# Patient Record
Sex: Female | Born: 1950 | Race: White | Hispanic: No | Smoking: Never smoker
Health system: Southern US, Community
[De-identification: ages and names within clinical notes are randomized; demographics above are authoritative.]

## PROBLEM LIST (undated history)

## (undated) DIAGNOSIS — R000 Tachycardia, unspecified: Secondary | ICD-10-CM

## (undated) DIAGNOSIS — I472 Ventricular tachycardia: Secondary | ICD-10-CM

## (undated) DIAGNOSIS — M329 Systemic lupus erythematosus, unspecified: Secondary | ICD-10-CM

## (undated) DIAGNOSIS — N189 Chronic kidney disease, unspecified: Secondary | ICD-10-CM

## (undated) HISTORY — DX: Tachycardia, unspecified: R00.0

## (undated) HISTORY — PX: COLON SURGERY: SHX602

## (undated) HISTORY — DX: Systemic lupus erythematosus, unspecified: M32.9

## (undated) HISTORY — PX: GALLBLADDER SURGERY: SHX652

## (undated) HISTORY — PX: SPLENECTOMY, TOTAL: SHX788

## (undated) HISTORY — PX: OTHER SURGICAL HISTORY: SHX169

## (undated) HISTORY — PX: ABDOMINAL HYSTERECTOMY: SHX81

## (undated) HISTORY — DX: Ventricular tachycardia: I47.2

---

## 1999-02-10 ENCOUNTER — Inpatient Hospital Stay (HOSPITAL_COMMUNITY): Admission: AD | Admit: 1999-02-10 | Discharge: 1999-02-12 | Payer: Self-pay | Admitting: Cardiology

## 1999-03-04 ENCOUNTER — Encounter: Payer: Self-pay | Admitting: Internal Medicine

## 2001-08-01 ENCOUNTER — Ambulatory Visit (HOSPITAL_BASED_OUTPATIENT_CLINIC_OR_DEPARTMENT_OTHER): Admission: RE | Admit: 2001-08-01 | Discharge: 2001-08-01 | Payer: Self-pay | Admitting: General Surgery

## 2007-12-12 ENCOUNTER — Encounter: Admission: RE | Admit: 2007-12-12 | Discharge: 2007-12-12 | Payer: Self-pay | Admitting: Orthopedic Surgery

## 2007-12-15 ENCOUNTER — Encounter: Admission: RE | Admit: 2007-12-15 | Discharge: 2007-12-15 | Payer: Self-pay | Admitting: Orthopedic Surgery

## 2009-02-19 ENCOUNTER — Ambulatory Visit: Payer: Self-pay | Admitting: Internal Medicine

## 2009-02-19 ENCOUNTER — Inpatient Hospital Stay (HOSPITAL_COMMUNITY): Admission: EM | Admit: 2009-02-19 | Discharge: 2009-02-24 | Payer: Self-pay | Admitting: Emergency Medicine

## 2009-02-23 HISTORY — PX: CARDIAC ELECTROPHYSIOLOGY STUDY AND ABLATION: SHX1294

## 2009-04-02 ENCOUNTER — Ambulatory Visit: Payer: Self-pay | Admitting: Internal Medicine

## 2009-04-02 DIAGNOSIS — R9431 Abnormal electrocardiogram [ECG] [EKG]: Secondary | ICD-10-CM | POA: Insufficient documentation

## 2009-04-02 DIAGNOSIS — I471 Supraventricular tachycardia: Secondary | ICD-10-CM | POA: Insufficient documentation

## 2009-04-02 DIAGNOSIS — I4892 Unspecified atrial flutter: Secondary | ICD-10-CM | POA: Insufficient documentation

## 2009-04-02 DIAGNOSIS — E039 Hypothyroidism, unspecified: Secondary | ICD-10-CM | POA: Insufficient documentation

## 2009-04-07 ENCOUNTER — Telehealth: Payer: Self-pay | Admitting: Internal Medicine

## 2009-04-09 ENCOUNTER — Telehealth: Payer: Self-pay | Admitting: Internal Medicine

## 2009-04-13 LAB — CONVERTED CEMR LAB
Free T4: 0.7 ng/dL (ref 0.6–1.6)
T3, Free: 2.7 pg/mL (ref 2.3–4.2)

## 2009-04-14 ENCOUNTER — Ambulatory Visit: Payer: Self-pay | Admitting: Internal Medicine

## 2009-05-08 ENCOUNTER — Telehealth: Payer: Self-pay | Admitting: Internal Medicine

## 2009-05-20 ENCOUNTER — Encounter: Payer: Self-pay | Admitting: Physician Assistant

## 2009-05-20 ENCOUNTER — Ambulatory Visit: Payer: Self-pay | Admitting: Cardiology

## 2009-06-30 ENCOUNTER — Ambulatory Visit: Payer: Self-pay | Admitting: Internal Medicine

## 2009-08-04 ENCOUNTER — Telehealth: Payer: Self-pay | Admitting: Internal Medicine

## 2009-08-05 ENCOUNTER — Telehealth: Payer: Self-pay | Admitting: Internal Medicine

## 2009-09-03 ENCOUNTER — Ambulatory Visit: Payer: Self-pay | Admitting: Internal Medicine

## 2009-12-03 ENCOUNTER — Ambulatory Visit: Payer: Self-pay | Admitting: Internal Medicine

## 2009-12-03 DIAGNOSIS — I10 Essential (primary) hypertension: Secondary | ICD-10-CM | POA: Insufficient documentation

## 2010-04-06 NOTE — Progress Notes (Signed)
  Phone Note Outgoing Call   Call placed by: Carollee Sires, RN, BSN,  May 08, 2009 9:44 AM Details for Reason: tachy palpitation episode. Summary of Call: To give MD's recommendations Initial call taken by: Carollee Sires, RN, BSN,  May 08, 2009 9:46 AM  Follow-up for Phone Call        Spoke with pt. regarding SVT episode last night. Pt. states she felt the rapid heart beats . She took a Inderal 10 mg by mouth as order per MD. The patpitations lasted for 15 to 20 minutes after taken medication. Dr. Olevia Perches DOD notified recommended for pt. to start Toprol XL 25 Mg once a day. Also to see Dr. Caryl Comes en 10 days. prescription send to Pharmacy. No openings on Dr. Addison Lank schedule until March 21st.  A appointment was made for pt. to see the PA on March 16th at 08:15 AM. Pt. aware. Carollee Sires, RN, BSN  May 08, 2009 10:11 AM      New/Updated Medications: TOPROL XL 25 MG XR24H-TAB (METOPROLOL SUCCINATE) take one tablet by mouth daily. Prescriptions: TOPROL XL 25 MG XR24H-TAB (METOPROLOL SUCCINATE) take one tablet by mouth daily.  #30 x 6   Entered by:   Carollee Sires, RN, BSN   Authorized by:   Fatima Sanger, MD, [facility]   Signed by:   Carollee Sires, RN, BSN on 05/08/2009   Method used:   Electronically to        [facility] (retail)       [place], [place]  [id number]       Ph: [phone]       Fax: [phone]   RxID:   [id number]

## 2010-04-06 NOTE — Consult Note (Signed)
Summary: [facility]   Imported By: [name] 03/20/2009 16:33:33  _____________________________________________________________________  External Attachment:    Type:   Image     Comment:   External Document

## 2010-04-06 NOTE — Assessment & Plan Note (Signed)
Summary: Tachycardia(SVT) episode.   Visit Type:  Follow-up  CC:  Tachycardia (SVT) episode.  History of Present Illness: This is a 60 year old white female patient, who underwent catheter ablation for atrial flutter back in December 2010. Atrial flutter was identified having manifest as a wide complex tachycardia. She had been taking Rythmol for some time and it created an appearance of ventricular tachycardia. She was found to have isthmus dependent flutter and was successfully ablated.  The patient began having recurrent tachypalpitations and was given Inderal to take on a p.r.n. basis. The tachypalpitations, increased, and she was placed on a event recorder. She had recurrent SVT, and Dr. Olevia Perches placed her on Toprol-XL. The patient has not had any further episodes since being placed on Toprol. She says she feels like it made be trying to start but then it levels off. She also states that she had a chemical stress test at Dr. Golden Hurter 2 weeks ago and was told it was normal.  Current Medications (verified): 1)  Mushroom  Vitamin .... Once Daily 2)  Plaquenil 200 Mg Tabs (Hydroxychloroquine Sulfate) .... Take One Tablet By Mouth Once Daily. 3)  Propranolol Hcl 10 Mg Tabs (Propranolol Hcl) .... As Needed 4)  Toprol Xl 25 Mg Xr24h-Tab (Metoprolol Succinate) .... Take One Tablet By Mouth Daily. 5)  Aspirin 81 Mg Tbec (Aspirin) .... Take One Tablet By Mouth Daily  Allergies: 1)  ! Codeine  Past History:  Past Medical History: Last updated: 04/02/2009 Wide complex tachycardia, probable atrial flutter with 1:1 and 2:1 conduction Systemic lupus erythematosus February 23, 2009, she underwent an   electrophysiology study with ablation of typical and atypical flutter. Abnormal echo cardiogram was normal cardiac MRI  Review of Systems       see history of present illness  Vital Signs:  Patient profile:   60 year old female Height:      63 inches Weight:      146.75 pounds BMI:      26.09 Pulse rate:   70 / minute Pulse rhythm:   regular Resp:     18 per minute BP sitting:   124 / 80  (left arm) Cuff size:   large  Vitals Entered By: Sidney Ace (May 20, 2009 8:38 AM)  Physical Exam  General:   Well-nournished, in no acute distress. Neck: No JVD, HJR, Bruit, or thyroid enlargement Lungs: No tachypnea, clear without wheezing, rales, or rhonchi Cardiovascular: RRR, PMI not displaced, heart sounds normal, no murmurs, gallops, bruit, thrill, or heave. Abdomen: BS normal. Soft without organomegaly, masses, lesions or tenderness. Extremities: without cyanosis, clubbing or edema. Good distal pulses bilateral SKin: Warm, no lesions or rashes  Musculoskeletal: No deformities Neuro: no focal signs    EKG  Procedure date:  05/20/2009  Findings:      normal sinus rhythm, poor R-wave progression, no acute change  Impression & Recommendations:  Problem # 1:  PALPITATIONS (ICD-785.1) Patient had recurrent palpitations and was found to have SVT on an apparent recorder. She was placed on Toprol-XL 25 mg daily. She has had no recurrence of the palpitations since being placed on this. She will continue to take Inderal p.r.n. Continue Toprol-XL Her updated medication list for this problem includes:    Propranolol Hcl 10 Mg Tabs (Propranolol hcl) .Marland Kitchen... As needed    Toprol Xl 25 Mg Xr24h-tab (Metoprolol succinate) .Marland Kitchen... Take one tablet by mouth daily.    Aspirin 81 Mg Tbec (Aspirin) .Marland Kitchen... Take one tablet by mouth daily  Problem # 2:  ATRIAL FLUTTER (ICD-427.32) Patient underwent successful ablation in December 2010 Her updated medication list for this problem includes:    Propranolol Hcl 10 Mg Tabs (Propranolol hcl) .Marland Kitchen... As needed    Toprol Xl 25 Mg Xr24h-tab (Metoprolol succinate) .Marland Kitchen... Take one tablet by mouth daily.    Aspirin 81 Mg Tbec (Aspirin) .Marland Kitchen... Take one tablet by mouth daily  Problem # 3:  ABNORMAL ELECTROCARDIOGRAM (ICD-794.31) Patient underwent  cardiac MRI in the hospital demonstrating normal LV function and no structural abnormal maladies. She also had a chemical stress test 2 weeks ago at Dr. Olivia Mackie Turner's office and was told this was normal Her updated medication list for this problem includes:    Propranolol Hcl 10 Mg Tabs (Propranolol hcl) .Marland Kitchen... As needed    Toprol Xl 25 Mg Xr24h-tab (Metoprolol succinate) .Marland Kitchen... Take one tablet by mouth daily.    Aspirin 81 Mg Tbec (Aspirin) .Marland Kitchen... Take one tablet by mouth daily  Patient Instructions: 1)  Your physician recommends that you schedule a follow-up appointment in: Keep already scheduled appt with Dr. Caryl Comes on June 08, 2009 at  11:45 2)  Your physician recommends that you continue on your current medications as directed. Please refer to the Current Medication list given to you today.

## 2010-04-06 NOTE — Assessment & Plan Note (Signed)
Summary: rov/recurrent tachycardia/per kelly   History of Present Illness: Debra Petersen is seeing in followup of atrial flutter ablation. She presented in the winter with one-to-one and 2:1 flutter. There there was also as I recall a 250 ms tachycardia. She was taken to the lab by Dr. Lovena Le. She was found to have atrial flutter at a cycle length of 205. She was found to have no evidence of accessory pathway and no evidence of antegrade dual AV nodal physiology.  following her procedure she has developed recurrent palpitations with SVT confirmed by tevent recorder    Current Medications (verified): 1)  Mushroom  Vitamin .... Once Daily 2)  Plaquenil 200 Mg Tabs (Hydroxychloroquine Sulfate) .... Take One Tablet By Mouth Once Daily. 3)  Propranolol Hcl 10 Mg Tabs (Propranolol Hcl) .... As Needed 4)  Toprol Xl 25 Mg Xr24h-Tab (Metoprolol Succinate) .... Take One Tablet By Mouth Daily. 5)  Aspirin 81 Mg Tbec (Aspirin) .... Take One Tablet By Mouth Daily  Allergies (verified): 1)  ! Codeine  Vital Signs:  Patient profile:   60 year old female Height:      63 inches Weight:      145 pounds BMI:     25.78 Pulse rate:   81 / minute Resp:     16 per minute BP sitting:   102 / 72  (left arm)  Vitals Entered By: Levora Angel, CNA (September 03, 2009 11:34 AM)  Physical Exam  General:  The patient was alert and oriented in no acute distress. HEENT Normal.  Neck veins were flat, carotids were brisk.  Lungs were clear.  Heart sounds were regular without murmurs or gallops.  Abdomen was soft with active bowel sounds. There is no clubbing cyanosis or edema. Skin Warm and dry    Impression & Recommendations:  Problem # 1:  SVT/ PSVT/ PAT (ICD-427.0)  Recurrent  discussedopptions incl anti a rhythmic risk of proarrhythmia or catheter ablation. We spent about 30-40 minutes discussing these issues and ultimately elected to proceed with flecainide 50 b.i.d. with a review of the status in 3  months. echo previously was normal; we don't have a Myoview  Her updated medication list for this problem includes:    Propranolol Hcl 10 Mg Tabs (Propranolol hcl) .Marland Kitchen... As needed    Flecainide Acetate 50 Mg Tabs (Flecainide acetate) .Marland Kitchen... Take 1 tablet by mouth two times a day    Aspirin 81 Mg Tbec (Aspirin) .Marland Kitchen... Take one tablet by mouth daily  Patient Instructions: 1)  Your physician has recommended you make the following change in your medication: Stop Toprol, Start Flecainide 50mg  1 tablet twice daily. 2)  Your physician recommends that you schedule a follow-up appointment in: 3 months with Dr Caryl Comes. Prescriptions: FLECAINIDE ACETATE 50 MG TABS (FLECAINIDE ACETATE) Take 1 tablet by mouth two times a day  #60 x 11   Entered by:   Advertising account executive BSN   Authorized by:   Nikki Dom, MD, [facility]   Signed by:   Chanetta Marshall RN BSN on 09/03/2009   Method used:   Electronically to        [facility]* (retail)       [address], [place]  [id number]       Ph: [phone]       Fax: [phone]   RxID:   [id number]

## 2010-04-06 NOTE — Assessment & Plan Note (Signed)
Summary: 3 MONTH ROV.SL   Visit Type:  Follow-up Primary Provider:  Dagmar Hait, MD   History of Present Illness: Debra Petersen is seeing in followup of atrial flutter ablation. She presented in the winter with one-to-one and 2:1 flutter. There there was also as I recall a 250 ms tachycardia. She was taken to the lab by Dr. Lovena Le. She was found to have atrial flutter at a cycle length of 205. She was found to have no evidence of accessory pathway and no evidence of antegrade dual AV nodal physiology.  following her procedure she has developed recurrent palpitations with SVT confirmed by tevent recorder and at her last visit we started her on flecainide as she was averse to undertaking a repeat procedure.  This has been associated with a marked improvement in the frequency of her episodes. However recently, she has developed dyspnea with exertion. This is unassociated with chest discomfort and I should note that she had a negative Myoview last December.  She decreased her flecainide to one time a day. The dyspnea has improved but not resolved. There has been no peripheral edema.  Her blood pressure was been noted to be elevated by her rheumatologist    Current Medications (verified): 1)  Mushroom  Vitamin .... Once Daily 2)  Plaquenil 200 Mg Tabs (Hydroxychloroquine Sulfate) .... Take One Tablet By Mouth Once Daily. 3)  Propranolol Hcl 10 Mg Tabs (Propranolol Hcl) .... As Needed 4)  Flecainide Acetate 50 Mg Tabs (Flecainide Acetate) .... Take 1 Tablet By Mouth Two Times A Day 5)  Aspirin 81 Mg Tbec (Aspirin) .... Take One Tablet By Mouth Daily  Allergies: 1)  ! Codeine  Past History:  Past Medical History: Last updated: 04/02/2009 Wide complex tachycardia, probable atrial flutter with 1:1 and 2:1 conduction Systemic lupus erythematosus February 23, 2009, she underwent an   electrophysiology study with ablation of typical and atypical flutter. Abnormal echo cardiogram was normal cardiac  MRI  Vital Signs:  Patient profile:   60 year old female Height:      63 inches Weight:      148 pounds BMI:     26.31 Pulse rate:   82 / minute BP sitting:   148 / 90  (left arm)  Vitals Entered By: Margaretmary Bayley CMA (December 03, 2009 10:26 AM)  Physical Exam  General:  The patient was alert and oriented in no acute distress. HEENT Normal.  Neck veins were flat, carotids were brisk.  Lungs were clear.  Heart sounds were regular without murmurs or gallops.  Abdomen was soft with active bowel sounds. There is no clubbing cyanosis or edema. Skin Warm and dry    Impression & Recommendations:  Problem # 1:  SVT/ PSVT/ PAT (ICD-427.0) the patient has improved symptom control with her 1C antiarrhythmic drug. However, there is dyspnea which is not something I would have associated with flecainide but which is improved with her having had her dose.  We'll undertaken limitation trial for 2 weeks to see how it is that she does. In the event that the symptoms resolved, we will put her back on per past. The concern previously was that it had caused slowing of her atrial flutter resulting in one-to-one AV conduction. However, her flutter circuit has been ablated.  Orders: EKG w/ Interpretation (93000)  Problem # 2:  ABNORMAL ELECTROCARDIOGRAM (ICD-794.31) stable Her updated medication list for this problem includes:    Propranolol Hcl 10 Mg Tabs (Propranolol hcl) .Marland Kitchen... As needed  Flecainide Acetate 50 Mg Tabs (Flecainide acetate) .Marland Kitchen... Take 1 tablet by mouth two times a day    Aspirin 81 Mg Tbec (Aspirin) .Marland Kitchen... Take one tablet by mouth daily  Problem # 3:  HYPERTENSION, BENIGN (ICD-401.1) her blood pressure is elevated. We will wait to see whether we end up treating her with a past known which may help her blood pressure as well prior to undertaking specific therapy. Her updated medication list for this problem includes:    Propranolol Hcl 10 Mg Tabs (Propranolol hcl) .Marland Kitchen... As  needed    Aspirin 81 Mg Tbec (Aspirin) .Marland Kitchen... Take one tablet by mouth daily

## 2010-04-06 NOTE — Progress Notes (Signed)
Summary:  returning call  Phone Note Call from Patient Call back at [phone]   Summary of Call: pt calling back, req call back Initial call taken by: Darnell Level,  April 09, 2009 9:03 AM  Follow-up for Phone Call        No answer at either number Barnett Abu, RN, BSN  April 09, 2009 9:56 AM   S/W pt and she is aware of the plan for an event monitor. She has not heard from [place]. I called Rod Holler and she printed the request. She is sick and will be leaving. She plans to call the pt on Mon. Pt aware of plan.  Follow-up by: Barnett Abu, RN, BSN,  April 10, 2009 9:07 AM

## 2010-04-06 NOTE — Assessment & Plan Note (Signed)
Summary: eph/post ablation   History of Present Illness: Debra Petersen is seen following a catheter ablation for atrial flutter. She presented to hospital in mid December 2010. Atrial flutter was identified having manifested as a wide-complex tachycardia. She been taking Rythmol on for sometime and it created a appearance of ventricular tachycardia.  she underwent EP testing by Dr. Lovena Le and was found and isthmus-dependent flutter and was successfully ablated.  We were never able to obtain records as to why should improve a past known first place.  Since that time she has had 2 issues. The first is that she is aware of her heartbeat when she exercises. The second is that she had one brief episode of tachycardia palpitations about 2 weeks ago associated with transient lightheadedness.  review of her hospital data demonstrate that her TSH was mildly elevated and also that no echo cardiogram was performed..    Allergies: 1)  ! Codeine  Past History:  Past Medical History: Wide complex tachycardia, probable atrial flutter with 1:1 and 2:1 conduction Systemic lupus erythematosus February 23, 2009, she underwent an   electrophysiology study with ablation of typical and atypical flutter. Abnormal echo cardiogram was normal cardiac MRI  Past Surgical History: Gallblader surgery hystoectomy spleenectomy colon surgery  Vital Signs:  Patient profile:   60 year old female Height:      63 inches Weight:      144 pounds BMI:     25.60 Pulse rate:   89 / minute BP sitting:   104 / 80  (left arm)  Vitals Entered By: Margaretmary Bayley CMA (April 02, 2009 9:30 AM)  Physical Exam  General:  Well developed, well nourished, in no acute distress. Head:  HEENT normal Neck:  Neck supple, no JVD. No masses, thyromegaly or abnormal cervical nodes. Lungs:  Clear bilaterally to auscultation and percussion. Heart:  regular rate and rhythm without murmurs or gallops Abdomen:  soft nontendersounds  present Extremities:  without clubbing cyanosis or edema Neurologic:  alert and oriented x3 Skin:  warm and dry   EKG  Procedure date:  04/02/2009  Findings:      sinus rhythm at 91 Intervals 0.16/0.08/0.37 Axis leftward -21 Poor R wave progression  Impression & Recommendations:  Problem # 1:  ATRIAL FLUTTER (ICD-427.32) the patient is status post flutter ablation. She has had recurrent tachypalpitations. She had a number of abnormal rhythms identified in hospital. It is possible that she has more than just is as dependent flutter. We have discussed the use of the monitoring period we would like to defer that for the present time I will wait and see how it is that she feels going forward The following medications were removed from the medication list:    Rythmol Sr 225 Mg Xr12h-cap (Propafenone hcl) ..... Once daily Her updated medication list for this problem includes:    Propranolol Hcl 10 Mg Tabs (Propranolol hcl) .Marland Kitchen... As needed  Orders: EKG w/ Interpretation (93000) TLB-T4 (Thyrox), Free [phone]) TLB-T3, Free (Triiodothyronine) (84481-T3FREE)  Problem # 2:  ABNORMAL ELECTROCARDIOGRAM (ICD-794.31) E. cardiogram with poor R-wave progression raises the possibility of prior MI .  however, she underwent cardiac MRI in-hospital demonstrating normal left ventricular function and no structural abnormalities.    Rythmol Sr 225 Mg Xr12h-cap (Propafenone hcl) ..... Once daily  Problem # 3:  PALPITATIONS (ICD-785.1)  as noted above. We discussed using. Beta blockers. We will use p.r.n. beta blockers I'm giving her a prescription for Inderal 10 to use as needed The  following medications were removed from the medication list:    Rythmol Sr 225 Mg Xr12h-cap (Propafenone hcl) ..... Once daily Her updated medication list for this problem includes:    Propranolol Hcl 10 Mg Tabs (Propranolol hcl) .Marland Kitchen... As needed  Orders: TLB-T4 (Thyrox), Free [phone]) TLB-T3, Free  (Triiodothyronine) (84481-T3FREE)  Problem # 4:  UNSPECIFIED HYPOTHYROIDISM (ICD-244.9) Her TSH was borderline elevated. As her some issues currently with her not having a primary care physician we will check her T3 and T4 today.  Patient Instructions: 1)  Your physician has recommended you make the following change in your medication: START PROPRANOLOL (INDERAL) 10MG  AS NEEDED 2)  Your physician recommends that you HAVE LABS TODAY: T3 AND T4 3)  Your physician recommends that you schedule a follow-up appointment in: 2-3 MONTHS Prescriptions: PROPRANOLOL HCL 10 MG TABS (PROPRANOLOL HCL) as needed  #15 x 6   Entered by:   Barnett Abu, RN, BSN   Authorized by:   Nikki Dom, MD, [facility]   Signed by:   Barnett Abu, RN, BSN on 04/02/2009   Method used:   Electronically to        [facility] (retail)       [address] [place], [place]  [id number]       Ph: [phone]       Fax: [phone]   RxID:   [id number]

## 2010-04-06 NOTE — Assessment & Plan Note (Signed)
Summary: Patient Instructions    Patient Instructions: 1)  Your physician has recommended you make the following change in your medication: Prpafenone 150 one pill two times a day. Stop Flecanide as a trial basis.  Call in two weeks to let us know how you are doing at [phone].

## 2010-04-06 NOTE — Assessment & Plan Note (Signed)
Summary: per check out/sf   CC:  follow up/ pt not having any more rapid rates except one episode while she was sleeping but it didnt fully wake her up.  History of Present Illness: Debra Petersen is seeing in followup of atrial flutter ablation. She presented in the winter with one-to-one and 2:1 flutter. There there was also as I recall a 250 ms tachycardia. She was taken to the [facility] by Dr. Lovena Le. She was found to have atrial flutter at a cycle length of 205. She was found to have no evidence of accessory pathway and no evidence of antegrade dual AV nodal physiology.  Following her procedure but a month later she began having palpitations every 3 weeks. These were rapid but quite different from before without associated presyncope. She received an event recorder r that demonstrated narrow QRS tachycardia at a cycle length of approximately 280 ms.she was started on low-dose Toprol which she has tolerated without untoward effects she's had no recurrent tachycardia in the last 8 weeks.  Current Medications (verified): 1)  Mushroom  Vitamin .... Once Daily 2)  Plaquenil 200 Mg Tabs (Hydroxychloroquine Sulfate) .... Take One Tablet By Mouth Once Daily. 3)  Propranolol Hcl 10 Mg Tabs (Propranolol Hcl) .... As Needed 4)  Toprol Xl 25 Mg Xr24h-Tab (Metoprolol Succinate) .... Take One Tablet By Mouth Daily. 5)  Aspirin 81 Mg Tbec (Aspirin) .... Take One Tablet By Mouth Daily  Allergies (verified): 1)  ! Codeine  Past History:  Past Medical History: Last updated: 04/02/2009 Wide complex tachycardia, probable atrial flutter with 1:1 and 2:1 conduction Systemic lupus erythematosus February 23, 2009, she underwent an   electrophysiology study with ablation of typical and atypical flutter. Abnormal echo cardiogram was normal cardiac MRI  Past Surgical History: Last updated: 04/02/2009 Gallblader surgery hystoectomy spleenectomy colon surgery  Family History: Last updated: 01-May-2009 Her mother  died of CAD and had a pacemaker Her father died of CAD  Social History: Last updated: May 01, 2009 Married  Tobacco Use - No.  Alcohol Use - yes-occasional use  Vital Signs:  Patient profile:   60 year old female Height:      63 inches Weight:      147 pounds BMI:     26.13 Pulse rate:   69 / minute Pulse rhythm:   regular BP sitting:   122 / 86  (left arm) Cuff size:   large  Vitals Entered By: Doug Sou CMA (June 30, 2009 4:23 PM)  Physical Exam  General:  The patient was alert and oriented in no acute distress. HEENT Normal.  Neck veins were flat, carotids were brisk.  Lungs were clear.  Heart sounds were regular without murmurs or gallops.  Abdomen was soft with active bowel sounds. There is no clubbing cyanosis or edema. Skin Warm and dry    Impression & Recommendations:  Problem # 1:  PALPITATIONS (ICD-785.1) the patient has a documented SVT.  the mechanism of this is not clear based on per EP study without evidence of decrement of conduction i.e. accessory pathway or dual AV nodal physiology or inducible atrial tachycardia. I suspect that she has AV reentry of some kind.  At this point she would like to decrease her Toprol. We will plan to avoid EP testing.  Will see her in 4 months    Propranolol Hcl 10 Mg Tabs (Propranolol hcl) .Marland Kitchen... As needed    Toprol Xl 25 Mg Xr24h-tab (Metoprolol succinate) .Marland Kitchen... Take one tablet by mouth daily.  Aspirin 81 Mg Tbec (Aspirin) .Marland Kitchen... Take one tablet by mouth daily  Problem # 2:  ATRIAL FLUTTER (ICD-427.32) atrial flutter status post ablation. Her updated medication list for this problem includes:    Propranolol Hcl 10 Mg Tabs (Propranolol hcl) .Marland Kitchen... As needed    Toprol Xl 25 Mg Xr24h-tab (Metoprolol succinate) .Marland Kitchen... Take one tablet by mouth daily.    Aspirin 81 Mg Tbec (Aspirin) .Marland Kitchen... Take one tablet by mouth daily

## 2010-04-06 NOTE — Letter (Signed)
Summary: [name] 2000 - 2003  [facility] - [facility]   Imported By: [name] 06/10/2009 11:27:24  _____________________________________________________________________  External Attachment:    Type:   Image     Comment:   External Document  Appended Document: [name] 2000 - 2003 From 200, seen in office since then.

## 2010-04-06 NOTE — Progress Notes (Signed)
Summary: Pt call back regarding tachycardia  Phone Note Call from Patient Call back at Home Phone [phone]   Caller: Patient Summary of Call: Pt calling back regarding the mess that was left yesterday about Tachycardia Initial call taken by: Delsa Sale,  August 05, 2009 4:55 PM  Follow-up for Phone Call        Pt calling back regarding tachycardia has had another episode yesterday pt request call asap  call abck # [id number] Delsa Sale  August 06, 2009 8:51 AM  Spoke with patient. Pt. states last tuesday she had an episode of tachycardia. According to pt.  Dr. Addison Lank nurse  was going to call her that day and never did. Pt states yesterday afternoon and at 2:00 am today she had another episode of fluttering. Pt states felt like she was going to pass out. Pt. took Propranolol 10 mg. It took 30 minutes to take effect. Pt. would like for MD or his nurse call her back asap. Carollee Sires, RN, BSN  August 06, 2009 9:18 AM   Additional Follow-up for Phone Call Additional follow up Details #1::        first episode Tues HR 185  BP 160/133  pain in head and other wierd feelings.  HR up and down took 25mg  of  Metoprolol .  Yesterday same wave came over her with pain in head and heart feeling like it was going to race but never did.  then woke up at 2am from dead sleep heart racing so fast would not register on monitor.  Took Propanolol 10mg  and it took 30 min to calm down to HR 110.  Feels like BP is low at times 90/60.  Feels very sluggish. Janan Halter, RN, BSN  August 06, 2009 10:27 AM    Additional Follow-up for Phone Call Additional follow up Details #2::    Discussed all with Dr Caryl Comes pt to continue with Metoprolol 25mg  daily.  She may break in half and take 12.5mg  two times a day.  Dr Caryl Comes also said it would be fine for her to take 20mg  of Propanolol at the start of an episode and can take another if BP is stable within the hour if still having symptoms.  We will arrange for an  appoinment with Dr Caryl Comes in the next month.  She has a busy schedule and will try andwork around.  Pt aware Janan Halter, RN, BSN  August 06, 2009 11:08 AM

## 2010-04-06 NOTE — Progress Notes (Signed)
Summary:  rapid heart rate/pt called again  Phone Note Call from Patient Call back at [phone]   Reason for Call: Talk to Nurse Summary of Call: pt states she called yesterday has not heard anything.... had another episode sunday night, rapid heart rate, up to 166, took 2 propanolol, heart rate got done to 97 Initial call taken by: Darnell Level,  April 07, 2009 10:02 AM  Follow-up for Phone Call        Called patient and left message on machine  Also need to inform of lab results Barnett Abu, RN, BSN  April 07, 2009 11:10 AM   Additional Follow-up for Phone Call Additional follow up Details #1::        Pt wants Dr. Caryl Comes to know this, he had mentioned to her a monitor and only gave her 12 tabs of propranolol. I will let him know and see if he wants to change anything. Barnett Abu, RN, BSN  April 07, 2009 11:30 AM   pt rtn your call Shelda Pal  April 08, 2009 3:44 PM     Additional Follow-up for Phone Call Additional follow up Details #2::    [facility] single event per Dr. Caryl Comes. Barnett Abu, RN, BSN  April 08, 2009 3:41 PM   Order entered. Called patient and left message on machine for her to call me back. Barnett Abu, RN, BSN  April 08, 2009 3:42 PM    Phone numbers are different (call back and home). I have left messages on both again. Barnett Abu, RN, BSN  April 08, 2009 5:58 PM

## 2010-04-06 NOTE — Progress Notes (Signed)
Summary: appt  Phone Note Call from Patient Call back at Home Phone [phone]   Caller: Patient Reason for Call: Talk to Nurse Summary of Call: having tachycardia today, request to be seen today Initial call taken by: Darnell Level,  Aug 04, 2009 2:48 PM  Follow-up for Phone Call        discussed with pt --tachycardia today --at about 2:15 heart started beating really fast-- heart rate 187-pt  took Propranolol 10mg -pt states heart rate still feels fast-we discussed trying vagal manuevers  -pt rechecked heart rate while on phone with me and it was down to 107--pt concerned because she has had two similar incidents of tachycardia since 06-30-09 appt with Dr Mariane Duval has had event monitor since ablation in December--pt will call 911 if tachycardia returns and she feels lightheaded/dizzy/unstable --will forward to Dr Caryl Comes for reveiw

## 2010-06-07 LAB — CARDIAC PANEL(CRET KIN+CKTOT+MB+TROPI)
CK, MB: 2.4 ng/mL (ref 0.3–4.0)
CK, MB: 3.2 ng/mL (ref 0.3–4.0)
Relative Index: 3 — ABNORMAL HIGH (ref 0.0–2.5)
Relative Index: INVALID (ref 0.0–2.5)
Total CK: 107 U/L (ref 7–177)
Total CK: 83 U/L (ref 7–177)
Troponin I: 0.02 ng/mL (ref 0.00–0.06)
Troponin I: 0.04 ng/mL (ref 0.00–0.06)

## 2010-06-07 LAB — HEPATIC FUNCTION PANEL
ALT: 18 U/L (ref 0–35)
AST: 23 U/L (ref 0–37)
Albumin: 3.6 g/dL (ref 3.5–5.2)
Alkaline Phosphatase: 92 U/L (ref 39–117)
Bilirubin, Direct: 0.2 mg/dL (ref 0.0–0.3)
Indirect Bilirubin: 0.4 mg/dL (ref 0.3–0.9)
Total Bilirubin: 0.6 mg/dL (ref 0.3–1.2)
Total Protein: 7.1 g/dL (ref 6.0–8.3)

## 2010-06-07 LAB — DIFFERENTIAL
Basophils Absolute: 0.1 10*3/uL (ref 0.0–0.1)
Basophils Relative: 1 % (ref 0–1)
Eosinophils Absolute: 0.1 10*3/uL (ref 0.0–0.7)
Eosinophils Relative: 1 % (ref 0–5)
Lymphocytes Relative: 56 % — ABNORMAL HIGH (ref 12–46)
Lymphs Abs: 8 10*3/uL — ABNORMAL HIGH (ref 0.7–4.0)
Monocytes Absolute: 0.7 10*3/uL (ref 0.1–1.0)
Monocytes Relative: 5 % (ref 3–12)
Neutro Abs: 5.2 10*3/uL (ref 1.7–7.7)
Neutrophils Relative %: 37 % — ABNORMAL LOW (ref 43–77)

## 2010-06-07 LAB — HEPARIN LEVEL (UNFRACTIONATED)
Heparin Unfractionated: 0.42 IU/mL (ref 0.30–0.70)
Heparin Unfractionated: 0.44 IU/mL (ref 0.30–0.70)
Heparin Unfractionated: 0.53 IU/mL (ref 0.30–0.70)

## 2010-06-07 LAB — URINALYSIS, ROUTINE W REFLEX MICROSCOPIC
Bilirubin Urine: NEGATIVE
Glucose, UA: NEGATIVE mg/dL
Ketones, ur: NEGATIVE mg/dL
Nitrite: NEGATIVE
Protein, ur: 30 mg/dL — AB
Specific Gravity, Urine: 1.017 (ref 1.005–1.030)
Urobilinogen, UA: 0.2 mg/dL (ref 0.0–1.0)
pH: 5.5 (ref 5.0–8.0)

## 2010-06-07 LAB — URINE MICROSCOPIC-ADD ON

## 2010-06-07 LAB — APTT: aPTT: 26 seconds (ref 24–37)

## 2010-06-07 LAB — PATHOLOGIST SMEAR REVIEW

## 2010-06-07 LAB — CBC
HCT: 32.1 % — ABNORMAL LOW (ref 36.0–46.0)
HCT: 34 % — ABNORMAL LOW (ref 36.0–46.0)
HCT: 40.6 % (ref 36.0–46.0)
Hemoglobin: 10.8 g/dL — ABNORMAL LOW (ref 12.0–15.0)
Hemoglobin: 11.2 g/dL — ABNORMAL LOW (ref 12.0–15.0)
Hemoglobin: 13.4 g/dL (ref 12.0–15.0)
MCHC: 32.9 g/dL (ref 30.0–36.0)
MCHC: 33 g/dL (ref 30.0–36.0)
MCHC: 33.5 g/dL (ref 30.0–36.0)
MCV: 93.8 fL (ref 78.0–100.0)
MCV: 94 fL (ref 78.0–100.0)
MCV: 94.1 fL (ref 78.0–100.0)
Platelets: 310 10*3/uL (ref 150–400)
Platelets: 337 10*3/uL (ref 150–400)
Platelets: 381 10*3/uL (ref 150–400)
RBC: 3.41 MIL/uL — ABNORMAL LOW (ref 3.87–5.11)
RBC: 3.61 MIL/uL — ABNORMAL LOW (ref 3.87–5.11)
RBC: 4.33 MIL/uL (ref 3.87–5.11)
RDW: 13.8 % (ref 11.5–15.5)
RDW: 13.9 % (ref 11.5–15.5)
RDW: 14.1 % (ref 11.5–15.5)
WBC: 10.3 10*3/uL (ref 4.0–10.5)
WBC: 11.5 10*3/uL — ABNORMAL HIGH (ref 4.0–10.5)
WBC: 14.1 10*3/uL — ABNORMAL HIGH (ref 4.0–10.5)

## 2010-06-07 LAB — POCT CARDIAC MARKERS
CKMB, poc: 2.5 ng/mL (ref 1.0–8.0)
Myoglobin, poc: 159 ng/mL (ref 12–200)
Troponin i, poc: <0.05 ng/mL (ref 0.00–0.09)

## 2010-06-07 LAB — TROPONIN I: Troponin I: 0.02 ng/mL (ref 0.00–0.06)

## 2010-06-07 LAB — BASIC METABOLIC PANEL
BUN: 15 mg/dL (ref 6–23)
CO2: 19 mEq/L (ref 19–32)
Calcium: 8.8 mg/dL (ref 8.4–10.5)
Chloride: 111 mEq/L (ref 96–112)
Creatinine, Ser: 1.23 mg/dL — ABNORMAL HIGH (ref 0.4–1.2)
GFR calc Af Amer: 54 mL/min — ABNORMAL LOW (ref >60)
GFR calc non Af Amer: 45 mL/min — ABNORMAL LOW (ref >60)
Glucose, Bld: 116 mg/dL — ABNORMAL HIGH (ref 70–99)
Potassium: 4 mEq/L (ref 3.5–5.1)
Sodium: 138 mEq/L (ref 135–145)

## 2010-06-07 LAB — CK TOTAL AND CKMB (NOT AT ARMC)
CK, MB: 5.2 ng/mL — ABNORMAL HIGH (ref 0.3–4.0)
Relative Index: 3.1 — ABNORMAL HIGH (ref 0.0–2.5)
Total CK: 166 U/L (ref 7–177)

## 2010-06-07 LAB — TSH: TSH: 4.61 u[IU]/mL — ABNORMAL HIGH (ref 0.350–4.500)

## 2010-06-07 LAB — D-DIMER, QUANTITATIVE: D-Dimer, Quant: <0.22 ug/mL-FEU (ref 0.00–0.48)

## 2010-06-07 LAB — PROTIME-INR
INR: 0.97 (ref 0.00–1.49)
Prothrombin Time: 12.8 seconds (ref 11.6–15.2)

## 2011-01-12 ENCOUNTER — Other Ambulatory Visit: Payer: Self-pay | Admitting: *Deleted

## 2011-01-14 MED ORDER — PROPAFENONE HCL 150 MG PO TABS: 150.0000 mg | ORAL_TABLET | Freq: Two times a day (BID) | ORAL | Status: DC

## 2011-01-24 ENCOUNTER — Telehealth: Payer: Self-pay | Admitting: *Deleted

## 2011-01-24 NOTE — Telephone Encounter (Signed)
Fax received from [facility]- refill request for Propafenone 150mg - the original prescription was written on 12/03/09 for one tablet twice daily. Per the pharmacy, the patient said that she takes one tablet by mouth three times daily. In looking back in her chart, we have not seen the patient since 12/03/09. I do not see any phone notes documented since then stating that she was instructed to increase her dose to three times daily. I left a message on [facility]'s voice mail that I cannot authorize for three times daily on the profafenone. I have instructed that they call back with questions or have the patient call. She should have an office visit as well.

## 2011-03-07 ENCOUNTER — Telehealth: Payer: Self-pay | Admitting: Internal Medicine

## 2011-03-07 DIAGNOSIS — I4892 Unspecified atrial flutter: Secondary | ICD-10-CM

## 2011-03-07 NOTE — Telephone Encounter (Signed)
New Problem:    Paitent called in needing a refill of her propafenone (RYTHMOL) 150 MG tablet and 10 MG tablet refilled at [facility] (phone# [phone]).

## 2011-03-09 ENCOUNTER — Other Ambulatory Visit: Payer: Self-pay | Admitting: *Deleted

## 2011-03-09 MED ORDER — PROPRANOLOL HCL 10 MG PO TABS: 10.0000 mg | ORAL_TABLET | ORAL | Status: DC | PRN

## 2011-03-09 MED ORDER — PROPAFENONE HCL 150 MG PO TABS: 150.0000 mg | ORAL_TABLET | Freq: Two times a day (BID) | ORAL | Status: DC

## 2011-04-08 ENCOUNTER — Ambulatory Visit: Payer: Self-pay | Admitting: Internal Medicine

## 2011-07-10 ENCOUNTER — Other Ambulatory Visit: Payer: Self-pay | Admitting: Internal Medicine

## 2011-07-25 ENCOUNTER — Other Ambulatory Visit: Payer: Self-pay | Admitting: Internal Medicine

## 2011-07-26 ENCOUNTER — Other Ambulatory Visit: Payer: Self-pay | Admitting: *Deleted

## 2011-07-26 DIAGNOSIS — I4892 Unspecified atrial flutter: Secondary | ICD-10-CM

## 2011-07-26 MED ORDER — PROPAFENONE HCL 150 MG PO TABS: 150.0000 mg | ORAL_TABLET | Freq: Two times a day (BID) | ORAL | Status: DC

## 2011-07-26 NOTE — Telephone Encounter (Signed)
RX sent to the pharmacy for # 60 tablets with no refills. The patient must come in for an office visit.

## 2011-08-05 ENCOUNTER — Telehealth: Payer: Self-pay | Admitting: Internal Medicine

## 2011-08-05 DIAGNOSIS — I4892 Unspecified atrial flutter: Secondary | ICD-10-CM

## 2011-08-05 MED ORDER — PROPAFENONE HCL 150 MG PO TABS: 150.0000 mg | ORAL_TABLET | Freq: Two times a day (BID) | ORAL | Status: DC

## 2011-08-05 NOTE — Telephone Encounter (Signed)
Pt called to make an appt with klein because she needs refill, pt is out and needs asap, propafenone at [facility] , pt [phone]

## 2011-08-22 ENCOUNTER — Ambulatory Visit (INDEPENDENT_AMBULATORY_CARE_PROVIDER_SITE_OTHER): Payer: Medicare Other | Admitting: Internal Medicine

## 2011-08-22 ENCOUNTER — Encounter: Payer: Self-pay | Admitting: Internal Medicine

## 2011-08-22 VITALS — BP 126/84 | HR 91 | Ht 64.0 in | Wt 139.5 lb

## 2011-08-22 DIAGNOSIS — I471 Supraventricular tachycardia: Secondary | ICD-10-CM

## 2011-08-22 DIAGNOSIS — I4892 Unspecified atrial flutter: Secondary | ICD-10-CM

## 2011-08-22 DIAGNOSIS — I10 Essential (primary) hypertension: Secondary | ICD-10-CM

## 2011-08-22 NOTE — Assessment & Plan Note (Signed)
Controlled on meds

## 2011-08-22 NOTE — Assessment & Plan Note (Signed)
Controlled By rhtyhmol

## 2011-08-22 NOTE — Progress Notes (Signed)
  HPI  Debra Petersen is a 61 y.o. female seeing in followup of atrial flutter ablation. She presented in the winter with one-to-one and 2:1 flutter. There there was also as I recall a 250 ms tachycardia. She was taken to the lab by Dr. Lovena Le. She was found to have atrial flutter at a cycle length of 205. She was found to have no evidence of accessory pathway and no evidence of antegrade dual AV nodal physiology.  following her procedure she has developed recurrent palpitations with SVT confirmed by tevent recorder   She has been on Rythmol which she is tolerating with only infrequent palpitations.  A major effort at her house has been knee stroke in her husband is soft with pneumonia and increasing challenges in taking care of him  Past Medical History  Diagnosis Date  . Wide-complex tachycardia     probable atrial flutter with 1-1 and 2-1 conduction  . Lupus (systemic lupus erythematosus)     Past Surgical History  Procedure Date  . Cardiac electrophysiology study and ablation 02/23/2009    of typical and atypical flutter  . Gallbladder surgery   . Hystoectomy   . Splenectomy, total   . Colon surgery     Current Outpatient Prescriptions  Medication Sig Dispense Refill  . aspirin 81 MG tablet Take 160 mg by mouth daily.      Marland Kitchen FLECAINIDE ACETATE PO Take 50 mg by mouth 2 (two) times daily.      . hydroxychloroquine (PLAQUENIL) 200 MG tablet Take 200 mg by mouth daily.      . INDERAL 10 MG tablet TAKE 1 TABLET AS NEEDED.  15 each  3  . propafenone (RYTHMOL) 150 MG tablet Take 1 tablet (150 mg total) by mouth 2 (two) times daily.  60 tablet  0    Allergies  Allergen Reactions  . Codeine     Review of Systems negative except from HPI and PMH  Physical Exam BP 126/84  Pulse 91  Ht 5\' 4"  (1.626 m)  Wt 139 lb 8 oz (63.277 kg)  BMI 23.95 kg/m2  Well developed and nourished in no acute distress HENT normal Neck supple with JVP-flat Clear Regular rate and rhythm, no murmurs  or gallops Abd-soft with active BS No Clubbing cyanosis edema Skin-warm and dry A & Oriented  Grossly normal sensory and motor function   Electrocardiogram demonstrates sinus rhythm at 1 intervals 0.16/08/35 Poor R wave progression fifth Assessment and  Plan

## 2011-08-22 NOTE — Patient Instructions (Signed)
Your physician wants you to follow-up in: 1 year with Dr. Caryl Comes. You will receive a reminder letter in the mail two months in advance. If you don't receive a letter, please call our office to schedule the follow-up appointment.  Please call out office with the blood pressure medication that you are taking.

## 2012-01-12 ENCOUNTER — Other Ambulatory Visit: Payer: Self-pay | Admitting: *Deleted

## 2012-01-12 DIAGNOSIS — I4892 Unspecified atrial flutter: Secondary | ICD-10-CM

## 2012-01-12 MED ORDER — PROPAFENONE HCL 150 MG PO TABS: 150.0000 mg | ORAL_TABLET | Freq: Two times a day (BID) | ORAL | Status: DC

## 2012-07-29 ENCOUNTER — Ambulatory Visit (INDEPENDENT_AMBULATORY_CARE_PROVIDER_SITE_OTHER): Payer: BLUE CROSS/BLUE SHIELD | Admitting: Family Medicine

## 2012-07-29 VITALS — BP 127/73 | HR 89 | Temp 98.1°F | Resp 16 | Ht 63.5 in | Wt 127.0 lb

## 2012-07-29 DIAGNOSIS — R197 Diarrhea, unspecified: Secondary | ICD-10-CM

## 2012-07-29 DIAGNOSIS — R110 Nausea: Secondary | ICD-10-CM

## 2012-07-29 LAB — POCT CBC
Granulocyte percent: 56.8 %G (ref 37–80)
HCT, POC: 38 % (ref 37.7–47.9)
Hemoglobin: 12 g/dL — AB (ref 12.2–16.2)
Lymph, poc: 3.5 — AB (ref 0.6–3.4)
MCH, POC: 30.2 pg (ref 27–31.2)
MCHC: 31.6 g/dL — AB (ref 31.8–35.4)
MCV: 95.6 fL (ref 80–97)
MID (cbc): 0.9 (ref 0–0.9)
MPV: 8.4 fL (ref 0–99.8)
POC Granulocyte: 5.7 (ref 2–6.9)
POC LYMPH PERCENT: 34.3 %L (ref 10–50)
POC MID %: 8.9 %M (ref 0–12)
Platelet Count, POC: 386 10*3/uL (ref 142–424)
RBC: 3.97 M/uL — AB (ref 4.04–5.48)
RDW, POC: 15.3 %
WBC: 10.1 10*3/uL (ref 4.6–10.2)

## 2012-07-29 MED ORDER — PROMETHAZINE HCL 25 MG PO TABS: 25.0000 mg | ORAL_TABLET | Freq: Three times a day (TID) | ORAL | Status: DC | PRN

## 2012-07-29 NOTE — Progress Notes (Signed)
[facility] [address] [place] [address] [phone]  Date:  07/29/2012   Name:  Debra Petersen   DOB:  1950/09/23   MRN:  [id number]  PCP:  No PCP Per Patient    Chief Complaint: Chills and Nausea   History of Present Illness:  Debra Petersen is a 62 y.o. very pleasant female patient who presents with the following:  Patient comes in today with complaints of dry heaves and nausea- no vomiting. She does not have any abdominal pain, no urinary symptoms.  Patient states that she woke up this way yesterday. Two nights ago she went out to dinner with her husband to eat at [facility] and shared a plate with her husband-  he is not sick. Patient denies any food allergies. Patient has had 3 large Coke's and only nibbled on crackers so far today. Patient has a hx of intestinal surgery 20 years ago.  She has had x3 loose liquid stools since yesterday, one stool liquid stool today. No blood in stool. Patient states that she is up to date on her immunizations. Denies sickness in any other family members. No change in medications. Patient states that she has had a slight headache, chills, and fever at 99.34F. Tylenol ES was helpful for the headache and fever.   She has used phenergan intermittently for many years, and has used in conjunction with her rhythmol on many occasions without ill effect.  Chart review reveals an EKG with normal QTc last year.    Patient Active Problem List   Diagnosis Date Noted  . HYPERTENSION, BENIGN 12/03/2009  . UNSPECIFIED HYPOTHYROIDISM 04/02/2009  . SVT/ PSVT/ PAT 04/02/2009  . ATRIAL FLUTTER 04/02/2009  . ABNORMAL ELECTROCARDIOGRAM 04/02/2009    Past Medical History  Diagnosis Date  . Wide-complex tachycardia     probable atrial flutter with 1-1 and 2-1 conduction  . Lupus (systemic lupus erythematosus)     Past Surgical History  Procedure Laterality Date  . Cardiac electrophysiology study and ablation  02/23/2009    of typical and  atypical flutter  . Gallbladder surgery    . Hystoectomy    . Splenectomy, total    . Colon surgery      History  Substance Use Topics  . Smoking status: Never Smoker   . Smokeless tobacco: Not on file  . Alcohol Use:     Family History  Problem Relation Age of Onset  . Coronary artery disease Mother     had a pacemaker  . Coronary artery disease Father     Allergies  Allergen Reactions  . Codeine     Medication list has been reviewed and updated.  Current Outpatient Prescriptions on File Prior to Visit  Medication Sig Dispense Refill  . propafenone (RYTHMOL) 150 MG tablet Take 1 tablet (150 mg total) by mouth 2 (two) times daily.  60 tablet  6   No current facility-administered medications on file prior to visit.    Review of Systems:  Constitutional: Reports fever at 99.34F in the past. Has had chills in the past.  Eyes: No complaints ENT: Denies tinnitus, pain, or difficulty swallowing.  Cardiovasular: has a history of AFib and SVT. Denies chest pain. Respiratory:  Denies shortness of breath, dyspnea, and cough Gastrointestinal: Had had frequent loose liquid stools, nausea, and cramping. Denies pains and heartburn Skin: Denies rashes or color changes Neurological: Denies syncope and tremors. Had had slight headache-relieved with Tylenol. Has history of Lupus  Physical Examination: Filed Vitals:   07/29/12 1754  BP: 127/73  Pulse: 89  Temp: 98.1 F (36.7 C)  Resp: 16   Filed Vitals:   07/29/12 1754  Height: 5' 3.5" (1.613 m)  Weight: 127 lb (57.607 kg)   Body mass index is 22.14 kg/(m^2). Ideal Body Weight: Weight in (lb) to have BMI = 25: 143.1  GEN: WDWN, NAD, Non-toxic, A & O x 3, looks well and in good health, trim build HEENT: Atraumatic, Normocephalic. PERRL. Trachea midline.  Neck supple. No masses, No LAD. No exudate. Mucous membranes moist.  Ears and Nose: No external deformity. CV: RRR, No M/G/R. No JVD. No thrill. No extra heart sounds.  Pulses positive in all extremities PULM: CTA B, no wheezes, crackles, rhonchi. No retractions. No resp. distress. No accessory muscle use. ABD: S, NT, ND, +BS- hyperactive,  No rebound. No HSM. EXTR: No c/c/e NEURO Normal gait.  PSYCH: Normally interactive. Conversant. Not depressed or anxious appearing.  Calm demeanor.   Results for orders placed in visit on 07/29/12 (from the past 24 hour(s))  POCT CBC     Status: Abnormal   Collection Time    07/29/12  6:29 PM      Result Value Range   WBC 10.1  4.6 - 10.2 K/uL   Lymph, poc 3.5 (*) 0.6 - 3.4   POC LYMPH PERCENT 34.3  10 - 50 %L   MID (cbc) 0.9  0 - 0.9   POC MID % 8.9  0 - 12 %M   POC Granulocyte 5.7  2 - 6.9   Granulocyte percent 56.8  37 - 80 %G   RBC 3.97 (*) 4.04 - 5.48 M/uL   Hemoglobin 12.0 (*) 12.2 - 16.2 g/dL   HCT, POC 38.0  37.7 - 47.9 %   MCV 95.6  80 - 97 fL   MCH, POC 30.2  27 - 31.2 pg   MCHC 31.6 (*) 31.8 - 35.4 g/dL   RDW, POC 15.3     Platelet Count, POC 386  142 - 424 K/uL   MPV 8.4  0 - 99.8 fL   Reviewed past CBC- her HG is better now than in the past.  Colonoscopy 2 years ago per her report- normal  Assessment and Plan: Assessment: Nausea without vomiting, mild diarrhea.  Suspect viral gastroenteritis.   Plan: Phenrgen 25mg  po Q8 hours prn nausea. Discussed possible QT prolongation with use of phenergan with rhythmol.  She feels comfortable with this as she has used this combination many times in th past.           Increase fluids           Bland diet (BRATS)           Follow up as needed with worsening symptoms or if not better in the next 2 days.    Signed Lamar Blinks, MD

## 2012-07-29 NOTE — Patient Instructions (Addendum)
Phenrgen 25mg  po Q8 hours prn nausea           Increase fluids           Bland diet (BRATS)           Follow up as needed with worsening symptoms

## 2013-01-15 ENCOUNTER — Other Ambulatory Visit: Payer: Self-pay | Admitting: Internal Medicine

## 2013-03-15 ENCOUNTER — Other Ambulatory Visit: Payer: Self-pay

## 2013-03-15 MED ORDER — PROPAFENONE HCL 150 MG PO TABS: ORAL_TABLET | ORAL | Status: DC

## 2013-03-19 ENCOUNTER — Ambulatory Visit (INDEPENDENT_AMBULATORY_CARE_PROVIDER_SITE_OTHER): Payer: Medicare Other | Admitting: Internal Medicine

## 2013-03-19 ENCOUNTER — Encounter: Payer: Self-pay | Admitting: Internal Medicine

## 2013-03-19 VITALS — BP 182/84 | HR 92 | Ht 63.0 in | Wt 126.0 lb

## 2013-03-19 DIAGNOSIS — I471 Supraventricular tachycardia, unspecified: Secondary | ICD-10-CM

## 2013-03-19 DIAGNOSIS — I4892 Unspecified atrial flutter: Secondary | ICD-10-CM

## 2013-03-19 DIAGNOSIS — I10 Essential (primary) hypertension: Secondary | ICD-10-CM

## 2013-03-19 DIAGNOSIS — Z6379 Other stressful life events affecting family and household: Secondary | ICD-10-CM

## 2013-03-19 NOTE — Patient Instructions (Signed)
Your physician recommends that you continue on your current medications as directed. Please refer to the Current Medication list given to you today.  Your physician wants you to follow-up in: 1 year with Dr. Klein.  You will receive a reminder letter in the mail two months in advance. If you don't receive a letter, please call our office to schedule the follow-up appointment.  

## 2013-03-19 NOTE — Assessment & Plan Note (Signed)
Have encouraged her to seek counseling

## 2013-03-19 NOTE — Assessment & Plan Note (Signed)
Well-controlled on current medications 

## 2013-03-19 NOTE — Progress Notes (Signed)
kf      Patient Care Team: Tivis Ringer, MD as PCP - General (Internal Medicine) Tivis Ringer, MD as Consulting Physician (Internal Medicine)   HPI  Debra Petersen is a 63 y.o. female seeing in followup of atrial flutter ablation. She presented 2013 with 1: 1 and 2:1 flutter. There there was also as I recall a 250 ms tachycardia. She was taken to the lab by Dr. Lovena Le. She was found to have atrial flutter at a cycle length of 205. She was found to have no evidence of accessory pathway and no evidence of antegrade dual AV nodal physiology.  Following her procedure she has developed recurrent palpitations with SVT confirmed by event recorder and was treated with  Rythmol which she is tolerating with only infrequent palpitations.   MRI 2010 demonstrated normal LV function without incident undertaken for ventricular tachycardia  The biggest issue has been a stress of taking care of her husband following his stroke.  Past Medical History  Diagnosis Date  . Wide-complex tachycardia     probable atrial flutter with 1-1 and 2-1 conduction  . Lupus (systemic lupus erythematosus)     Past Surgical History  Procedure Laterality Date  . Cardiac electrophysiology study and ablation  02/23/2009    of typical and atypical flutter  . Gallbladder surgery    . Hystoectomy    . Splenectomy, total    . Colon surgery      Current Outpatient Prescriptions  Medication Sig Dispense Refill  . aspirin 81 MG tablet Take 81 mg by mouth daily.      . hydroxychloroquine (PLAQUENIL) 200 MG tablet Take by mouth every other day.      . promethazine (PHENERGAN) 25 MG tablet Take 1 tablet (25 mg total) by mouth every 8 (eight) hours as needed for nausea.  20 tablet  0  . propafenone (RYTHMOL) 150 MG tablet TAKE 1 TABLET TWICE DAILY.  60 tablet  1   No current facility-administered medications for this visit.    Allergies  Allergen Reactions  . Codeine     Review of Systems negative except  from HPI and PMH  Physical Exam BP 182/84  Pulse 92  Ht 5\' 3"  (1.6 m)  Wt 126 lb (57.153 kg)  BMI 22.33 kg/m2 Well developed and nourished in no acute distress HENT normal Neck supple with JVP-flat Clear Regular rate and rhythm, no murmurs or gallops Abd-soft with active BS No Clubbing cyanosis edema Skin-warm and dry A & Oriented  Grossly normal sensory and motor function   ECG sinus @or  92 intervals 16/08/35 axis XXI  Q waves in lead V1-V3; unchanged from 2013  Assessment and  Plan

## 2013-03-19 NOTE — Assessment & Plan Note (Addendum)
Blood pressure is significantly elevated. She will check it at home. I asked her to follow up with Dr.Avva  in the event that the blood pressures remained elevated.  And I would probably favor the initial use of chlorthalidone

## 2013-07-15 ENCOUNTER — Other Ambulatory Visit: Payer: Self-pay | Admitting: Cardiology

## 2014-02-12 ENCOUNTER — Other Ambulatory Visit: Payer: Self-pay | Admitting: Internal Medicine

## 2014-02-12 DIAGNOSIS — Z1231 Encounter for screening mammogram for malignant neoplasm of breast: Secondary | ICD-10-CM

## 2014-02-22 ENCOUNTER — Other Ambulatory Visit: Payer: Self-pay | Admitting: Internal Medicine

## 2014-03-19 ENCOUNTER — Inpatient Hospital Stay: Admission: RE | Admit: 2014-03-19 | Payer: Medicare Other | Source: Ambulatory Visit

## 2014-07-01 ENCOUNTER — Other Ambulatory Visit: Payer: Self-pay | Admitting: Internal Medicine

## 2014-09-13 ENCOUNTER — Ambulatory Visit (INDEPENDENT_AMBULATORY_CARE_PROVIDER_SITE_OTHER): Payer: Medicare Other | Admitting: Physician Assistant

## 2014-09-13 VITALS — BP 132/74 | HR 104 | Temp 99.3°F | Resp 18 | Ht 63.0 in | Wt 122.0 lb

## 2014-09-13 DIAGNOSIS — R059 Cough, unspecified: Secondary | ICD-10-CM

## 2014-09-13 DIAGNOSIS — J329 Chronic sinusitis, unspecified: Secondary | ICD-10-CM | POA: Diagnosis not present

## 2014-09-13 DIAGNOSIS — R05 Cough: Secondary | ICD-10-CM

## 2014-09-13 DIAGNOSIS — J3489 Other specified disorders of nose and nasal sinuses: Secondary | ICD-10-CM | POA: Diagnosis not present

## 2014-09-13 MED ORDER — BENZONATATE 100 MG PO CAPS: 100.0000 mg | ORAL_CAPSULE | Freq: Three times a day (TID) | ORAL | Status: DC | PRN

## 2014-09-13 MED ORDER — CEFDINIR 300 MG PO CAPS: 300.0000 mg | ORAL_CAPSULE | Freq: Two times a day (BID) | ORAL | Status: AC

## 2014-09-13 MED ORDER — IPRATROPIUM BROMIDE 0.03 % NA SOLN: 2.0000 | Freq: Two times a day (BID) | NASAL | Status: DC

## 2014-09-13 NOTE — Patient Instructions (Signed)
Please hydrate well with 64oz of water per day (about 4 regular sized water bottles). Please take the abx if you begin to worsen.  You can take it twice per day.  Around the end, when you symptoms are resolving, you may take both pills at the same time.   Upper Respiratory Infection, Adult An upper respiratory infection (URI) is also sometimes known as the common cold. The upper respiratory tract includes the nose, sinuses, throat, trachea, and bronchi. Bronchi are the airways leading to the lungs. Most people improve within 1 week, but symptoms can last up to 2 weeks. A residual cough may last even longer.  CAUSES Many different viruses can infect the tissues lining the upper respiratory tract. The tissues become irritated and inflamed and often become very moist. Mucus production is also common. A cold is contagious. You can easily spread the virus to others by oral contact. This includes kissing, sharing a glass, coughing, or sneezing. Touching your mouth or nose and then touching a surface, which is then touched by another person, can also spread the virus. SYMPTOMS  Symptoms typically develop 1 to 3 days after you come in contact with a cold virus. Symptoms vary from person to person. They may include:  Runny nose.  Sneezing.  Nasal congestion.  Sinus irritation.  Sore throat.  Loss of voice (laryngitis).  Cough.  Fatigue.  Muscle aches.  Loss of appetite.  Headache.  Low-grade fever. DIAGNOSIS  You might diagnose your own cold based on familiar symptoms, since most people get a cold 2 to 3 times a year. Your caregiver can confirm this based on your exam. Most importantly, your caregiver can check that your symptoms are not due to another disease such as strep throat, sinusitis, pneumonia, asthma, or epiglottitis. Blood tests, throat tests, and X-rays are not necessary to diagnose a common cold, but they may sometimes be helpful in excluding other more serious diseases. Your  caregiver will decide if any further tests are required. RISKS AND COMPLICATIONS  You may be at risk for a more severe case of the common cold if you smoke cigarettes, have chronic heart disease (such as heart failure) or lung disease (such as asthma), or if you have a weakened immune system. The very young and very old are also at risk for more serious infections. Bacterial sinusitis, middle ear infections, and bacterial pneumonia can complicate the common cold. The common cold can worsen asthma and chronic obstructive pulmonary disease (COPD). Sometimes, these complications can require emergency medical care and may be life-threatening. PREVENTION  The best way to protect against getting a cold is to practice good hygiene. Avoid oral or hand contact with people with cold symptoms. Wash your hands often if contact occurs. There is no clear evidence that vitamin C, vitamin E, echinacea, or exercise reduces the chance of developing a cold. However, it is always recommended to get plenty of rest and practice good nutrition. TREATMENT  Treatment is directed at relieving symptoms. There is no cure. Antibiotics are not effective, because the infection is caused by a virus, not by bacteria. Treatment may include:  Increased fluid intake. Sports drinks offer valuable electrolytes, sugars, and fluids.  Breathing heated mist or steam (vaporizer or shower).  Eating chicken soup or other clear broths, and maintaining good nutrition.  Getting plenty of rest.  Using gargles or lozenges for comfort.  Controlling fevers with ibuprofen or acetaminophen as directed by your caregiver.  Increasing usage of your inhaler if you have  asthma. Zinc gel and zinc lozenges, taken in the first 24 hours of the common cold, can shorten the duration and lessen the severity of symptoms. Pain medicines may help with fever, muscle aches, and throat pain. A variety of non-prescription medicines are available to treat congestion  and runny nose. Your caregiver can make recommendations and may suggest nasal or lung inhalers for other symptoms.  HOME CARE INSTRUCTIONS   Only take over-the-counter or prescription medicines for pain, discomfort, or fever as directed by your caregiver.  Use a warm mist humidifier or inhale steam from a shower to increase air moisture. This may keep secretions moist and make it easier to breathe.  Drink enough water and fluids to keep your urine clear or pale yellow.  Rest as needed.  Return to work when your temperature has returned to normal or as your caregiver advises. You may need to stay home longer to avoid infecting others. You can also use a face mask and careful hand washing to prevent spread of the virus. SEEK MEDICAL CARE IF:   After the first few days, you feel you are getting worse rather than better.  You need your caregiver's advice about medicines to control symptoms.  You develop chills, worsening shortness of breath, or brown or red sputum. These may be signs of pneumonia.  You develop yellow or brown nasal discharge or pain in the face, especially when you bend forward. These may be signs of sinusitis.  You develop a fever, swollen neck glands, pain with swallowing, or white areas in the back of your throat. These may be signs of strep throat. SEEK IMMEDIATE MEDICAL CARE IF:   You have a fever.  You develop severe or persistent headache, ear pain, sinus pain, or chest pain.  You develop wheezing, a prolonged cough, cough up blood, or have a change in your usual mucus (if you have chronic lung disease).  You develop sore muscles or a stiff neck. Document Released: 08/17/2000 Document Revised: 05/16/2011 Document Reviewed: 05/29/2013 [facility] Patient Information 2015 [place], [place]. This information is not intended to replace advice given to you by your health care provider. Make sure you discuss any questions you have with your health care provider.

## 2014-09-13 NOTE — Progress Notes (Signed)
[facility] [address], [place] [address] [phone]  Date:  09/13/2014   Name:  Debra Petersen   DOB:  09-19-1950   MRN:  [id number]  PCP:  Tivis Ringer, MD    History of Present Illness:  Debra Petersen is a 64 y.o. female with past medical history of SVT, lupus, and splenectomy who presents to [facility] for chief complaint of a cough, runny nose, sneezing, and nasal pressure for 3 days.she states that she is coughing a thick yellow sputum that is perhaps postnasal. She has no shortness of breath of or dyspnea. There is no fever, body aches, pains or fatigue. She is concerned because she can traumatically get worse very easily and is in 2 days.    Patient Active Problem List   Diagnosis Date Noted  . Stress due to illness of family member 03/19/2013  . HYPERTENSION, BENIGN 12/03/2009  . UNSPECIFIED HYPOTHYROIDISM 04/02/2009  . SVT/ PSVT/ PAT 04/02/2009  . ATRIAL FLUTTER 04/02/2009  . ABNORMAL ELECTROCARDIOGRAM 04/02/2009    Past Medical History  Diagnosis Date  . Wide-complex tachycardia     probable atrial flutter with 1-1 and 2-1 conduction  . Lupus (systemic lupus erythematosus)     Past Surgical History  Procedure Laterality Date  . Cardiac electrophysiology study and ablation  02/23/2009    of typical and atypical flutter  . Gallbladder surgery    . Hystoectomy    . Splenectomy, total    . Colon surgery      History  Substance Use Topics  . Smoking status: Never Smoker   . Smokeless tobacco: Not on file  . Alcohol Use: Not on file    Family History  Problem Relation Age of Onset  . Coronary artery disease Mother     had a pacemaker  . Coronary artery disease Father     Allergies  Allergen Reactions  . Codeine     Medication list has been reviewed and updated.  Current Outpatient Prescriptions on File Prior to Visit  Medication Sig Dispense Refill  . aspirin 81 MG tablet Take 81 mg by mouth daily.    . propafenone (RYTHMOL)  150 MG tablet TAKE 1 TABLET TWICE DAILY. 60 tablet 0  . hydroxychloroquine (PLAQUENIL) 200 MG tablet Take by mouth every other day.    . promethazine (PHENERGAN) 25 MG tablet Take 1 tablet (25 mg total) by mouth every 8 (eight) hours as needed for nausea. (Patient not taking: Reported on 09/13/2014) 20 tablet 0   No current facility-administered medications on file prior to visit.    ROS ROS otherwise unremarkable unless listed above.  Physical Examination: BP 132/74 mmHg  Pulse 104  Temp(Src) 99.3 F (37.4 C) (Oral)  Resp 18  Ht 5\' 3"  (1.6 m)  Wt 122 lb (55.339 kg)  BMI 21.62 kg/m2  SpO2 97% Ideal Body Weight: Weight in (lb) to have BMI = 25: 140.8  Physical Exam Alert, oriented, cooperative. PERRLA with normal conjunctiva. External ear, canal, and TM are normal bilaterally without perforation, erythema, or effusion. Some rhinorrhea and limited nasal edema. Maxillary sinus tenderness.  No tonsillar erythema, swelling, or exudates. Artery with regular rate and rhythm. Lungs sounds are normal without wheezing rhonchi or rales. There is no cervical, tonsillar, submandibular, or auricular lymphadenopathy.  Assessment and Plan: 64 year old female with past medical history listed above is here today for chief complaint of cold-like symptoms. This is likely an upper respiratory infection with sinusitis of viral etiology.treating supportively  at this time. I have printed out a prescription of Cefdinir.    Sinusitis, unspecified chronicity, unspecified location - Plan: benzonatate (TESSALON) 100 MG capsule, ipratropium (ATROVENT) 0.03 % nasal spray, cefdinir (OMNICEF) 300 MG capsule  Cough - Plan: benzonatate (TESSALON) 100 MG capsule  Rhinorrhea - Plan: benzonatate (TESSALON) 100 MG capsule, ipratropium (ATROVENT) 0.03 % nasal spray, cefdinir (OMNICEF) 300 MG capsule   Ivar Drape, PA-C [facility] 09/13/2014 3:18 PM

## 2014-12-16 ENCOUNTER — Other Ambulatory Visit: Payer: Self-pay

## 2014-12-16 ENCOUNTER — Telehealth: Payer: Self-pay

## 2014-12-16 MED ORDER — PROPAFENONE HCL 150 MG PO TABS: 150.0000 mg | ORAL_TABLET | Freq: Two times a day (BID) | ORAL | Status: DC

## 2014-12-16 NOTE — Telephone Encounter (Signed)
I refilled her enough Propafenone 150 mg 1 BID to hold her until her appointment in 03/2015, pre [facility].Marland Kitchen

## 2014-12-16 NOTE — Telephone Encounter (Signed)
Ok to give her enough refills to get her through her office visit in January.

## 2014-12-16 NOTE — Telephone Encounter (Signed)
Pt called to get a refill for Propafenone 150 mg 1 BID. She has not been here since 03/2013 and needed to come back for a 1 yr f/u and did not make it. She has an appointment for 03/2015 with Dr. Caryl Comes. Should I refill this?

## 2014-12-16 NOTE — Telephone Encounter (Signed)
Pt called to get a refill for Propafenone 150 mg 1 BID. She has not been here since 03/2013 and needed to come back for a 1 yr f/u and did not make. She has an appointment for 03/2015 Dr. Caryl Comes. I am going to

## 2015-03-20 NOTE — Progress Notes (Signed)
kf      Patient Care Team: Prince Solian, MD as PCP - General (Internal Medicine) Prince Solian, MD as Consulting Physician (Internal Medicine)   HPI  Debra Petersen is a 65 y.o. female seeing in followup of atrial flutter ablation. She presented 2013 with 1: 1 and 2:1 flutter. There there was also as I recall a 250 ms tachycardia. She was taken to the lab by Dr. Lovena Le. She was found to have atrial flutter at a cycle length of 205. She was found to have no evidence of accessory pathway and no evidence of antegrade dual AV nodal physiology.  Following her procedure she has developed recurrent palpitations with SVT confirmed by event recorder and was treated with  Rythmol,in the wake of her husband is dying, she has taken less. She had more palpitations that she has resumed it albeit irregularly. In the past she's also used adjunctive Inderal. DATE PR interval QRSduration Dose  1**/15 16 80   1/17 16 82 150 bid    She is doing ok since the death of her husband in march     MRI 2010 demonstrated normal LV function without incident undertaken for ventricular tachycardia  The biggest issue has been a stress of taking care of her husband following his stroke. He will be a suicidal major paperwork together.0 82  Past Medical History  Diagnosis Date  . Wide-complex tachycardia (HCC)     probable atrial flutter with 1-1 and 2-1 conduction  . Lupus (systemic lupus erythematosus) ([place])     Past Surgical History  Procedure Laterality Date  . Cardiac electrophysiology study and ablation  02/23/2009    of typical and atypical flutter  . Gallbladder surgery    . Hystoectomy    . Splenectomy, total    . Colon surgery      Current Outpatient Prescriptions  Medication Sig Dispense Refill  . aspirin 81 MG tablet Take 81 mg by mouth daily.    . propafenone (RYTHMOL) 150 MG tablet Take 1 tablet (150 mg total) by mouth 2 (two) times daily. 60 tablet 2   No current  facility-administered medications for this visit.    Allergies  Allergen Reactions  . Codeine     Review of Systems negative except from HPI and PMH  Physical Exam BP 140/84 mmHg  Pulse 90  Ht 5\' 3"  (1.6 m)  Wt 113 lb (51.256 kg)  BMI 20.02 kg/m2 Well developed and nourished in no acute distress HENT normal Neck supple with JVP-flat Clear Regular rate and rhythm, no murmurs or gallops Abd-soft with active BS No Clubbing cyanosis edema Skin-warm and dry A & Oriented  Grossly normal sensory and motor function   ECG sinus @or  92 intervals 16/08/35 axis XXI  Q waves in lead V1-V3; unchanged from 2013  Assessment and  Plan  Atrial tach  Hypertension  Her BP remians elevated and looking at the office records from Dr. Vicente Serene, they were elevated also in December. I have suggested that she be given therapy if her blood pressures remain above 135  in 2 months   We will continue her on propafenone. It seems to be effective. We have also represcribed her adjunctive Inderal. She continues to exercise. This is helped deal with the loss of her husband.

## 2015-03-23 ENCOUNTER — Encounter: Payer: Self-pay | Admitting: Internal Medicine

## 2015-03-23 ENCOUNTER — Ambulatory Visit (INDEPENDENT_AMBULATORY_CARE_PROVIDER_SITE_OTHER): Payer: Medicare Other | Admitting: Internal Medicine

## 2015-03-23 VITALS — BP 140/84 | HR 90 | Ht 63.0 in | Wt 113.0 lb

## 2015-03-23 DIAGNOSIS — I4892 Unspecified atrial flutter: Secondary | ICD-10-CM | POA: Diagnosis not present

## 2015-03-23 DIAGNOSIS — I471 Supraventricular tachycardia: Secondary | ICD-10-CM | POA: Diagnosis not present

## 2015-03-23 MED ORDER — PROPRANOLOL HCL 10 MG PO TABS: ORAL_TABLET | ORAL | Status: DC

## 2015-03-23 MED ORDER — PROPAFENONE HCL 150 MG PO TABS: 150.0000 mg | ORAL_TABLET | Freq: Two times a day (BID) | ORAL | Status: DC

## 2015-03-23 NOTE — Patient Instructions (Signed)
Medication Instructions: 1) Take propranolol 10 mg one tablet by mouth every 30 minutes up to 3 doses as directed for heart racing/ palpitations  Labwork: - none  Procedures/Testing: - none  Follow-Up: - Your physician wants you to follow-up in: 1 year with Dr. Caryl Comes. You will receive a reminder letter in the mail two months in advance. If you don't receive a letter, please call our office to schedule the follow-up appointment.  Any Additional Special Instructions Will Be Listed Below (If Applicable).

## 2015-04-28 ENCOUNTER — Telehealth: Payer: Self-pay | Admitting: Family Medicine

## 2015-06-11 DIAGNOSIS — M329 Systemic lupus erythematosus, unspecified: Secondary | ICD-10-CM | POA: Diagnosis not present

## 2015-06-11 DIAGNOSIS — F419 Anxiety disorder, unspecified: Secondary | ICD-10-CM | POA: Diagnosis not present

## 2015-06-11 DIAGNOSIS — R8299 Other abnormal findings in urine: Secondary | ICD-10-CM | POA: Diagnosis not present

## 2015-06-11 DIAGNOSIS — N390 Urinary tract infection, site not specified: Secondary | ICD-10-CM | POA: Diagnosis not present

## 2015-06-11 DIAGNOSIS — R809 Proteinuria, unspecified: Secondary | ICD-10-CM | POA: Diagnosis not present

## 2015-06-11 DIAGNOSIS — I10 Essential (primary) hypertension: Secondary | ICD-10-CM | POA: Diagnosis not present

## 2015-06-11 DIAGNOSIS — N183 Chronic kidney disease, stage 3 (moderate): Secondary | ICD-10-CM | POA: Diagnosis not present

## 2015-06-11 DIAGNOSIS — Z681 Body mass index (BMI) 19 or less, adult: Secondary | ICD-10-CM | POA: Diagnosis not present

## 2015-06-11 DIAGNOSIS — I4892 Unspecified atrial flutter: Secondary | ICD-10-CM | POA: Diagnosis not present

## 2016-02-19 DIAGNOSIS — I10 Essential (primary) hypertension: Secondary | ICD-10-CM | POA: Diagnosis not present

## 2016-02-26 DIAGNOSIS — Z0000 Encounter for general adult medical examination without abnormal findings: Secondary | ICD-10-CM | POA: Diagnosis not present

## 2016-02-26 DIAGNOSIS — M329 Systemic lupus erythematosus, unspecified: Secondary | ICD-10-CM | POA: Diagnosis not present

## 2016-02-26 DIAGNOSIS — R809 Proteinuria, unspecified: Secondary | ICD-10-CM | POA: Diagnosis not present

## 2016-02-26 DIAGNOSIS — F419 Anxiety disorder, unspecified: Secondary | ICD-10-CM | POA: Diagnosis not present

## 2016-02-26 DIAGNOSIS — Z23 Encounter for immunization: Secondary | ICD-10-CM | POA: Diagnosis not present

## 2016-02-26 DIAGNOSIS — I10 Essential (primary) hypertension: Secondary | ICD-10-CM | POA: Diagnosis not present

## 2016-02-26 DIAGNOSIS — Z8249 Family history of ischemic heart disease and other diseases of the circulatory system: Secondary | ICD-10-CM | POA: Diagnosis not present

## 2016-02-26 DIAGNOSIS — G609 Hereditary and idiopathic neuropathy, unspecified: Secondary | ICD-10-CM | POA: Diagnosis not present

## 2016-02-26 DIAGNOSIS — I4892 Unspecified atrial flutter: Secondary | ICD-10-CM | POA: Diagnosis not present

## 2016-02-26 DIAGNOSIS — N183 Chronic kidney disease, stage 3 (moderate): Secondary | ICD-10-CM | POA: Diagnosis not present

## 2016-06-06 DIAGNOSIS — H1030 Unspecified acute conjunctivitis, unspecified eye: Secondary | ICD-10-CM | POA: Diagnosis not present

## 2016-06-06 DIAGNOSIS — H00019 Hordeolum externum unspecified eye, unspecified eyelid: Secondary | ICD-10-CM | POA: Diagnosis not present

## 2017-02-21 ENCOUNTER — Other Ambulatory Visit: Payer: Self-pay | Admitting: Internal Medicine

## 2017-03-02 DIAGNOSIS — H524 Presbyopia: Secondary | ICD-10-CM | POA: Diagnosis not present

## 2017-03-22 DIAGNOSIS — Z23 Encounter for immunization: Secondary | ICD-10-CM | POA: Diagnosis not present

## 2017-03-22 DIAGNOSIS — I10 Essential (primary) hypertension: Secondary | ICD-10-CM | POA: Diagnosis not present

## 2017-03-29 DIAGNOSIS — Z0000 Encounter for general adult medical examination without abnormal findings: Secondary | ICD-10-CM | POA: Diagnosis not present

## 2017-03-29 DIAGNOSIS — M329 Systemic lupus erythematosus, unspecified: Secondary | ICD-10-CM | POA: Diagnosis not present

## 2017-03-29 DIAGNOSIS — I10 Essential (primary) hypertension: Secondary | ICD-10-CM | POA: Diagnosis not present

## 2017-03-29 DIAGNOSIS — I4892 Unspecified atrial flutter: Secondary | ICD-10-CM | POA: Diagnosis not present

## 2017-03-29 DIAGNOSIS — R82998 Other abnormal findings in urine: Secondary | ICD-10-CM | POA: Diagnosis not present

## 2017-03-29 DIAGNOSIS — N183 Chronic kidney disease, stage 3 (moderate): Secondary | ICD-10-CM | POA: Diagnosis not present

## 2017-04-27 DIAGNOSIS — Z13820 Encounter for screening for osteoporosis: Secondary | ICD-10-CM | POA: Diagnosis not present

## 2017-04-27 DIAGNOSIS — F418 Other specified anxiety disorders: Secondary | ICD-10-CM | POA: Diagnosis not present

## 2017-04-27 DIAGNOSIS — N183 Chronic kidney disease, stage 3 (moderate): Secondary | ICD-10-CM | POA: Diagnosis not present

## 2017-04-27 DIAGNOSIS — K529 Noninfective gastroenteritis and colitis, unspecified: Secondary | ICD-10-CM | POA: Diagnosis not present

## 2017-04-27 DIAGNOSIS — I10 Essential (primary) hypertension: Secondary | ICD-10-CM | POA: Diagnosis not present

## 2017-06-01 DIAGNOSIS — M810 Age-related osteoporosis without current pathological fracture: Secondary | ICD-10-CM | POA: Diagnosis not present

## 2017-06-01 DIAGNOSIS — N951 Menopausal and female climacteric states: Secondary | ICD-10-CM | POA: Diagnosis not present

## 2017-06-01 DIAGNOSIS — H9209 Otalgia, unspecified ear: Secondary | ICD-10-CM | POA: Diagnosis not present

## 2017-06-01 DIAGNOSIS — I10 Essential (primary) hypertension: Secondary | ICD-10-CM | POA: Diagnosis not present

## 2017-06-29 DIAGNOSIS — M810 Age-related osteoporosis without current pathological fracture: Secondary | ICD-10-CM | POA: Diagnosis not present

## 2017-06-29 DIAGNOSIS — Z6820 Body mass index (BMI) 20.0-20.9, adult: Secondary | ICD-10-CM | POA: Diagnosis not present

## 2017-09-18 DIAGNOSIS — N183 Chronic kidney disease, stage 3 (moderate): Secondary | ICD-10-CM | POA: Diagnosis not present

## 2017-09-18 DIAGNOSIS — M810 Age-related osteoporosis without current pathological fracture: Secondary | ICD-10-CM | POA: Diagnosis not present

## 2017-09-18 DIAGNOSIS — I10 Essential (primary) hypertension: Secondary | ICD-10-CM | POA: Diagnosis not present

## 2017-09-18 DIAGNOSIS — I4892 Unspecified atrial flutter: Secondary | ICD-10-CM | POA: Diagnosis not present

## 2017-09-22 DIAGNOSIS — I10 Essential (primary) hypertension: Secondary | ICD-10-CM | POA: Diagnosis not present

## 2017-09-26 ENCOUNTER — Other Ambulatory Visit: Payer: Self-pay | Admitting: Internal Medicine

## 2017-09-26 ENCOUNTER — Ambulatory Visit
Admission: RE | Admit: 2017-09-26 | Discharge: 2017-09-26 | Disposition: A | Discharge status: Home / Self Care | Payer: Medicare Other | Source: Ambulatory Visit | Attending: Internal Medicine | Admitting: Internal Medicine

## 2017-09-26 DIAGNOSIS — I129 Hypertensive chronic kidney disease with stage 1 through stage 4 chronic kidney disease, or unspecified chronic kidney disease: Secondary | ICD-10-CM | POA: Diagnosis not present

## 2017-09-26 DIAGNOSIS — N183 Chronic kidney disease, stage 3 unspecified: Secondary | ICD-10-CM

## 2017-10-02 DIAGNOSIS — F419 Anxiety disorder, unspecified: Secondary | ICD-10-CM | POA: Diagnosis not present

## 2017-10-02 DIAGNOSIS — N184 Chronic kidney disease, stage 4 (severe): Secondary | ICD-10-CM | POA: Diagnosis not present

## 2017-10-02 DIAGNOSIS — M810 Age-related osteoporosis without current pathological fracture: Secondary | ICD-10-CM | POA: Diagnosis not present

## 2017-10-02 DIAGNOSIS — I10 Essential (primary) hypertension: Secondary | ICD-10-CM | POA: Diagnosis not present

## 2017-10-16 DIAGNOSIS — I129 Hypertensive chronic kidney disease with stage 1 through stage 4 chronic kidney disease, or unspecified chronic kidney disease: Secondary | ICD-10-CM | POA: Diagnosis not present

## 2017-10-16 DIAGNOSIS — N183 Chronic kidney disease, stage 3 (moderate): Secondary | ICD-10-CM | POA: Diagnosis not present

## 2017-10-16 DIAGNOSIS — M329 Systemic lupus erythematosus, unspecified: Secondary | ICD-10-CM | POA: Diagnosis not present

## 2017-10-16 DIAGNOSIS — N184 Chronic kidney disease, stage 4 (severe): Secondary | ICD-10-CM | POA: Diagnosis not present

## 2017-10-16 DIAGNOSIS — R300 Dysuria: Secondary | ICD-10-CM | POA: Diagnosis not present

## 2017-10-24 ENCOUNTER — Ambulatory Visit (HOSPITAL_COMMUNITY)
Admission: EM | Admit: 2017-10-24 | Discharge: 2017-10-24 | Disposition: A | Discharge status: Home / Self Care | Payer: Medicare Other | Attending: Family Medicine | Admitting: Family Medicine

## 2017-10-24 ENCOUNTER — Encounter (HOSPITAL_COMMUNITY): Payer: Self-pay | Admitting: Emergency Medicine

## 2017-10-24 DIAGNOSIS — S81812A Laceration without foreign body, left lower leg, initial encounter: Secondary | ICD-10-CM | POA: Diagnosis not present

## 2017-10-24 DIAGNOSIS — W2203XA Walked into furniture, initial encounter: Secondary | ICD-10-CM

## 2017-10-24 DIAGNOSIS — Z23 Encounter for immunization: Secondary | ICD-10-CM

## 2017-10-24 DIAGNOSIS — S81811A Laceration without foreign body, right lower leg, initial encounter: Secondary | ICD-10-CM

## 2017-10-24 MED ORDER — TETANUS-DIPHTH-ACELL PERTUSSIS 5-2.5-18.5 LF-MCG/0.5 IM SUSP
0.5000 mL | Freq: Once | INTRAMUSCULAR | Status: AC
  Administered 2017-10-24: 0.5 mL via INTRAMUSCULAR

## 2017-10-24 MED ORDER — AMOXICILLIN-POT CLAVULANATE 875-125 MG PO TABS: 1.0000 | ORAL_TABLET | Freq: Two times a day (BID) | ORAL | 0 refills | Status: DC

## 2017-10-24 MED ORDER — TETANUS-DIPHTH-ACELL PERTUSSIS 5-2.5-18.5 LF-MCG/0.5 IM SUSP
INTRAMUSCULAR | Status: AC
  Filled 2017-10-24: qty 0.5

## 2017-10-24 NOTE — ED Provider Notes (Addendum)
[facility]    CSN: [id number] Arrival date & time: 10/24/17  1918     History   Chief Complaint Chief Complaint  Patient presents with  . Laceration    HPI Debra Petersen is a 67 y.o. female.   HPI  Laceration to left shin several hours ago.  She states she walked into a drawer that was left open.  Bleeding was stopped with pressure.  She is here to have this repaired.  She states she is never had sutures before.  Tetanus is not up-to-date.  Past Medical History:  Diagnosis Date  . Lupus (systemic lupus erythematosus) ([place])   . Wide-complex tachycardia (HCC)    probable atrial flutter with 1-1 and 2-1 conduction    Patient Active Problem List   Diagnosis Date Noted  . Stress due to illness of family member 03/19/2013  . HYPERTENSION, BENIGN 12/03/2009  . UNSPECIFIED HYPOTHYROIDISM 04/02/2009  . SVT/ PSVT/ PAT 04/02/2009  . ATRIAL FLUTTER 04/02/2009  . ABNORMAL ELECTROCARDIOGRAM 04/02/2009    Past Surgical History:  Procedure Laterality Date  . CARDIAC ELECTROPHYSIOLOGY STUDY AND ABLATION  02/23/2009   of typical and atypical flutter  . COLON SURGERY    . GALLBLADDER SURGERY    . hystoectomy    . SPLENECTOMY, TOTAL      OB History   None      Home Medications    Prior to Admission medications   Medication Sig Start Date End Date Taking? Authorizing Provider  amLODipine (NORVASC) 5 MG tablet Take 5 mg by mouth daily.   Yes [provider]  aspirin 81 MG tablet Take 81 mg by mouth daily.   Yes [provider]  Cholecalciferol (D3 ADULT PO) Take by mouth.   Yes [provider]  propranolol (INDERAL) 10 MG tablet Take one tablet by mouth as directed for heart racing/ palpitations 03/23/15  Yes Deboraha Sprang, MD  simvastatin (ZOCOR) 20 MG tablet Take 20 mg by mouth daily.   Yes [provider]  amoxicillin-clavulanate (AUGMENTIN) 875-125 MG tablet Take 1 tablet by mouth every 12 (twelve) hours. 10/24/17   Raylene Everts, MD  propafenone (RYTHMOL) 150 MG tablet Take 1 tablet (150 mg total) by mouth 2 (two) times daily. 03/23/15   Deboraha Sprang, MD    Family History Family History  Problem Relation Age of Onset  . Coronary artery disease Mother        had a pacemaker  . Coronary artery disease Father     Social History Social History   Tobacco Use  . Smoking status: Never Smoker  Substance Use Topics  . Alcohol use: Not on file  . Drug use: Not on file     Allergies   Codeine   Review of Systems Review of Systems  Constitutional: Negative for chills and fever.  HENT: Negative for ear pain and sore throat.   Eyes: Negative for pain and visual disturbance.  Respiratory: Negative for cough and shortness of breath.   Cardiovascular: Negative for chest pain and palpitations.  Gastrointestinal: Negative for abdominal pain and vomiting.  Genitourinary: Negative for dysuria and hematuria.  Musculoskeletal: Negative for arthralgias and back pain.  Skin: Positive for wound. Negative for color change and rash.  Neurological: Negative for seizures and syncope.  All other systems reviewed and are negative.    Physical Exam Triage Vital Signs ED Triage Vitals [10/24/17 1937]  Enc Vitals Group     BP (!) 151/83  Pulse Rate 87     Resp 16     Temp      Temp src      SpO2 98 %     Weight      Height      Head Circumference      Peak Flow      Pain Score      Pain Loc      Pain Edu?      Excl. in [place]?    No data found.  Updated Vital Signs BP (!) 151/83   Pulse 87   Resp 16   SpO2 98%   Visual Acuity Right Eye Distance:   Left Eye Distance:   Bilateral Distance:    Right Eye Near:   Left Eye Near:    Bilateral Near:     Physical Exam  Constitutional: She appears well-developed and well-nourished. No distress.  HENT:  Head: Normocephalic and atraumatic.  Mouth/Throat: Oropharynx is clear and moist.  Eyes: Pupils are equal, round, and reactive to light.  Conjunctivae are normal.  Neck: Normal range of motion.  Cardiovascular: Normal rate.  Pulmonary/Chest: Effort normal. No respiratory distress.  Abdominal: Soft. She exhibits no distension.  Musculoskeletal: Normal range of motion. She exhibits no edema.  Neurological: She is alert.  Skin: Skin is warm and dry.     Longitudinal laceration left lower leg, central, 8 cm     UC Treatments / Results  Labs (all labs ordered are listed, but only abnormal results are displayed) Labs Reviewed - No data to display  EKG None  Radiology No results found.  Procedures Laceration Repair Date/Time: 10/24/2017 9:41 PM Performed by: Raylene Everts, MD Authorized by: Raylene Everts, MD   Consent:    Consent obtained:  Verbal   Consent given by:  Patient   Risks discussed:  Infection, poor cosmetic result and poor wound healing   Alternatives discussed:  No treatment Anesthesia (see MAR for exact dosages):    Anesthesia method:  Local infiltration   Local anesthetic:  Lidocaine 2% WITH epi Laceration details:    Location:  Leg   Leg location:  L lower leg   Length (cm):  8   Depth (mm):  6 Repair type:    Repair type:  Intermediate Pre-procedure details:    Preparation:  Patient was prepped and draped in usual sterile fashion Exploration:    Hemostasis achieved with:  Direct pressure   Wound exploration: entire depth of wound probed and visualized     Wound extent: areolar tissue violated     Wound extent: no foreign bodies/material noted     Contaminated: yes   Treatment:    Area cleansed with:  Shur-Clens and Betadine   Amount of cleaning:  Standard   Irrigation volume:  10   Irrigation method:  Syringe Skin repair:    Repair method:  Sutures and Steri-Strips   Suture size:  4-0   Suture material:  Chromic gut   Suture technique:  Simple interrupted   Number of sutures:  4   Number of Steri-Strips:  10 Approximation:    Approximation:  Close Post-procedure  details:    Dressing:  Bulky dressing   Patient tolerance of procedure:  Tolerated well, no immediate complications Comments:     Patient was very worried about scarring.  I told her that she is going to have a scar but I would try to minimize this.  She was not keen on sutures, so  I closed the wound with subcutaneous Vicryl, and then closed the wound with Steri-Strips.  Result was acceptable.  Importance of keeping Steri-Strips in place for 7 full days was emphasized to patient.   (including critical care time)  Medications Ordered in UC Medications  Tdap (BOOSTRIX) injection 0.5 mL (0.5 mLs Intramuscular Given 10/24/17 2031)    Initial Impression / Assessment and Plan / UC Course  I have reviewed the triage vital signs and the nursing notes.  Pertinent labs & imaging results that were available during my care of the patient were reviewed by me and considered in my medical decision making (see chart for details).      Final Clinical Impressions(s) / UC Diagnoses   Final diagnoses:  Laceration of left lower extremity, initial encounter  Laceration of left lower extremity, initial encounter     Discharge Instructions     Keep clean and reasonably dry Protect the tape to make sure they stay on for at least 7 days If they start to lift up you can tape around the edges Take the antibiotic twice a day for 3 days to prevent infection Return if you have any complications    ED Prescriptions    Medication Sig Dispense Auth. Provider   amoxicillin-clavulanate (AUGMENTIN) 875-125 MG tablet Take 1 tablet by mouth every 12 (twelve) hours. 6 tablet Raylene Everts, MD     Controlled Substance Prescriptions [place] Controlled Substance Registry consulted? No   Raylene Everts, MD 10/24/17 2143    Raylene Everts, MD 12/04/17 1026

## 2017-10-24 NOTE — ED Triage Notes (Signed)
PT cut left shin on a chest of drawers today at 2pm. PT has area steri stripped and bleeding controlled.

## 2017-10-24 NOTE — Discharge Instructions (Signed)
Keep clean and reasonably dry Protect the tape to make sure they stay on for at least 7 days If they start to lift up you can tape around the edges Take the antibiotic twice a day for 3 days to prevent infection Return if you have any complications

## 2017-11-01 DIAGNOSIS — Z23 Encounter for immunization: Secondary | ICD-10-CM | POA: Diagnosis not present

## 2017-11-01 DIAGNOSIS — F419 Anxiety disorder, unspecified: Secondary | ICD-10-CM | POA: Diagnosis not present

## 2017-11-01 DIAGNOSIS — I10 Essential (primary) hypertension: Secondary | ICD-10-CM | POA: Diagnosis not present

## 2017-11-01 DIAGNOSIS — N184 Chronic kidney disease, stage 4 (severe): Secondary | ICD-10-CM | POA: Diagnosis not present

## 2017-12-12 DIAGNOSIS — N183 Chronic kidney disease, stage 3 (moderate): Secondary | ICD-10-CM | POA: Diagnosis not present

## 2017-12-12 DIAGNOSIS — N189 Chronic kidney disease, unspecified: Secondary | ICD-10-CM | POA: Diagnosis not present

## 2017-12-12 DIAGNOSIS — I129 Hypertensive chronic kidney disease with stage 1 through stage 4 chronic kidney disease, or unspecified chronic kidney disease: Secondary | ICD-10-CM | POA: Diagnosis not present

## 2017-12-12 DIAGNOSIS — M329 Systemic lupus erythematosus, unspecified: Secondary | ICD-10-CM | POA: Diagnosis not present

## 2017-12-12 DIAGNOSIS — D631 Anemia in chronic kidney disease: Secondary | ICD-10-CM | POA: Diagnosis not present

## 2017-12-29 ENCOUNTER — Telehealth: Payer: Self-pay | Admitting: Internal Medicine

## 2017-12-29 ENCOUNTER — Other Ambulatory Visit: Payer: Self-pay | Admitting: *Deleted

## 2017-12-29 MED ORDER — PROPRANOLOL HCL 10 MG PO TABS: ORAL_TABLET | ORAL | 0 refills | Status: DC

## 2017-12-29 NOTE — Telephone Encounter (Signed)
New Message:        *STAT* If patient is at the pharmacy, call can be transferred to refill team.   1. Which medications need to be refilled? (please list name of each medication and dose if known) propranolol (INDERAL) 10 MG tablet  2. Which pharmacy/location (including street and city if local pharmacy) is medication to be sent to?[place], [place]  3. Do they need a 30 day or 90 day supply? Just enough to get to 03/13/18 appt

## 2017-12-30 ENCOUNTER — Other Ambulatory Visit: Payer: Self-pay | Admitting: Internal Medicine

## 2018-01-02 ENCOUNTER — Other Ambulatory Visit: Payer: Self-pay | Admitting: Internal Medicine

## 2018-01-02 MED ORDER — PROPAFENONE HCL 150 MG PO TABS: 150.0000 mg | ORAL_TABLET | Freq: Two times a day (BID) | ORAL | 1 refills | Status: DC

## 2018-03-12 DIAGNOSIS — N183 Chronic kidney disease, stage 3 (moderate): Secondary | ICD-10-CM | POA: Diagnosis not present

## 2018-03-12 DIAGNOSIS — M329 Systemic lupus erythematosus, unspecified: Secondary | ICD-10-CM | POA: Diagnosis not present

## 2018-03-12 DIAGNOSIS — D631 Anemia in chronic kidney disease: Secondary | ICD-10-CM | POA: Diagnosis not present

## 2018-03-12 DIAGNOSIS — I129 Hypertensive chronic kidney disease with stage 1 through stage 4 chronic kidney disease, or unspecified chronic kidney disease: Secondary | ICD-10-CM | POA: Diagnosis not present

## 2018-03-13 ENCOUNTER — Ambulatory Visit: Payer: Medicare Other | Admitting: Internal Medicine

## 2018-04-03 ENCOUNTER — Encounter (INDEPENDENT_AMBULATORY_CARE_PROVIDER_SITE_OTHER): Payer: Self-pay

## 2018-04-03 ENCOUNTER — Ambulatory Visit: Payer: Medicare Other | Admitting: Internal Medicine

## 2018-04-03 ENCOUNTER — Encounter: Payer: Self-pay | Admitting: Internal Medicine

## 2018-04-03 VITALS — BP 124/78 | HR 81 | Ht 63.0 in | Wt 120.2 lb

## 2018-04-03 DIAGNOSIS — I4892 Unspecified atrial flutter: Secondary | ICD-10-CM

## 2018-04-03 DIAGNOSIS — I471 Supraventricular tachycardia: Secondary | ICD-10-CM | POA: Diagnosis not present

## 2018-04-03 NOTE — Progress Notes (Signed)
kf      Patient Care Team: Prince Solian, MD as PCP - General (Internal Medicine) Prince Solian, MD as Consulting Physician (Internal Medicine)   HPI  Debra Petersen is a 68 y.o. female seeing in followup of atrial flutter ablation. She presented 2013 with 1: 1 and 2:1 flutter. There there was also as I recall a 250 ms tachycardia. She was taken to the lab by Dr. Lovena Le. She was found to have atrial flutter at a cycle length of 205. She was found to have no evidence of accessory pathway and no evidence of antegrade dual AV nodal physiology.  Following her procedure she has developed recurrent palpitations with SVT confirmed by event recorder and was treated with  Rythmol,in the wake of her husband is dying, she has taken less. She had more palpitations that she has resumed it albeit irregularly. In the past she's also used adjunctive Inderal.  DATE PR interval QRSduration Dose  1/15 16 80   1/17 16 82 150 bid    DATE TEST EF   12/10 cMRI  67 % No DGE         Denies chest pain shortness of breath or edema.  Has occasional palpitations which responded to propafenone.  She now takes it once a day and has had no palpitations in the last 8 months.  Date Cr K Hgb  10/19 1.83   12.6           Past Medical History:  Diagnosis Date  . Lupus (systemic lupus erythematosus) ([place])   . Wide-complex tachycardia (HCC)    probable atrial flutter with 1-1 and 2-1 conduction    Past Surgical History:  Procedure Laterality Date  . CARDIAC ELECTROPHYSIOLOGY STUDY AND ABLATION  02/23/2009   of typical and atypical flutter  . COLON SURGERY    . GALLBLADDER SURGERY    . hystoectomy    . SPLENECTOMY, TOTAL      Current Outpatient Medications  Medication Sig Dispense Refill  . amLODipine (NORVASC) 5 MG tablet Take 10 mg by mouth daily.     Marland Kitchen aspirin 81 MG tablet Take 81 mg by mouth daily.    . Cholecalciferol (D3 ADULT PO) Take 1 tablet by mouth daily.     . propafenone (RYTHMOL) 150  MG tablet Take 150 mg by mouth daily.    . propranolol (INDERAL) 10 MG tablet Take one tablet by mouth as directed for heart racing/ palpitations Patient must keep 03/2018 appointment for further refills 30 tablet 0  . simvastatin (ZOCOR) 20 MG tablet Take 20 mg by mouth daily.     No current facility-administered medications for this visit.     Allergies  Allergen Reactions  . Codeine     Review of Systems negative except from HPI and PMH  Physical Exam BP 124/78   Pulse 81   Ht 5\' 3"  (1.6 m)   Wt 120 lb 3.2 oz (54.5 kg)   SpO2 99%   BMI 21.29 kg/m  Well developed and nourished in no acute distress HENT normal Neck supple with JVP-flat Clear Regular rate and rhythm, no murmurs or gallops Abd-soft with active BS No Clubbing cyanosis edema Skin-warm and dry A & Oriented  Grossly normal sensory and motor function   ECG sinus @ 81 19/09/38 RAE Q waves V1-2 unchanged    Assessment and  Plan  Atrial tach  Hypertension   Renal Dysfunction Gd 4   Arrhythmia well controlled on daily propafenone  BP well controlled  Renal function stable

## 2018-04-03 NOTE — Patient Instructions (Signed)

## 2018-04-10 DIAGNOSIS — I10 Essential (primary) hypertension: Secondary | ICD-10-CM | POA: Diagnosis not present

## 2018-04-10 DIAGNOSIS — M810 Age-related osteoporosis without current pathological fracture: Secondary | ICD-10-CM | POA: Diagnosis not present

## 2018-04-13 DIAGNOSIS — R82998 Other abnormal findings in urine: Secondary | ICD-10-CM | POA: Diagnosis not present

## 2018-04-13 DIAGNOSIS — N184 Chronic kidney disease, stage 4 (severe): Secondary | ICD-10-CM | POA: Diagnosis not present

## 2018-04-13 DIAGNOSIS — E7849 Other hyperlipidemia: Secondary | ICD-10-CM | POA: Diagnosis not present

## 2018-04-13 DIAGNOSIS — I10 Essential (primary) hypertension: Secondary | ICD-10-CM | POA: Diagnosis not present

## 2018-04-13 DIAGNOSIS — Z23 Encounter for immunization: Secondary | ICD-10-CM | POA: Diagnosis not present

## 2018-04-13 DIAGNOSIS — Z0000 Encounter for general adult medical examination without abnormal findings: Secondary | ICD-10-CM | POA: Diagnosis not present

## 2018-04-18 ENCOUNTER — Other Ambulatory Visit: Payer: Self-pay | Admitting: Internal Medicine

## 2018-04-18 DIAGNOSIS — Z1231 Encounter for screening mammogram for malignant neoplasm of breast: Secondary | ICD-10-CM

## 2018-05-16 ENCOUNTER — Other Ambulatory Visit: Payer: Self-pay | Admitting: Internal Medicine

## 2018-07-11 DIAGNOSIS — L989 Disorder of the skin and subcutaneous tissue, unspecified: Secondary | ICD-10-CM | POA: Diagnosis not present

## 2018-07-11 DIAGNOSIS — W57XXXA Bitten or stung by nonvenomous insect and other nonvenomous arthropods, initial encounter: Secondary | ICD-10-CM | POA: Diagnosis not present

## 2018-07-24 DIAGNOSIS — N183 Chronic kidney disease, stage 3 (moderate): Secondary | ICD-10-CM | POA: Diagnosis not present

## 2018-07-24 DIAGNOSIS — M329 Systemic lupus erythematosus, unspecified: Secondary | ICD-10-CM | POA: Diagnosis not present

## 2018-07-24 DIAGNOSIS — I129 Hypertensive chronic kidney disease with stage 1 through stage 4 chronic kidney disease, or unspecified chronic kidney disease: Secondary | ICD-10-CM | POA: Diagnosis not present

## 2018-07-24 DIAGNOSIS — D631 Anemia in chronic kidney disease: Secondary | ICD-10-CM | POA: Diagnosis not present

## 2018-08-02 DIAGNOSIS — N183 Chronic kidney disease, stage 3 (moderate): Secondary | ICD-10-CM | POA: Diagnosis not present

## 2018-10-02 ENCOUNTER — Other Ambulatory Visit: Payer: Self-pay | Admitting: Internal Medicine

## 2019-01-09 DIAGNOSIS — M329 Systemic lupus erythematosus, unspecified: Secondary | ICD-10-CM | POA: Diagnosis not present

## 2019-01-09 DIAGNOSIS — D631 Anemia in chronic kidney disease: Secondary | ICD-10-CM | POA: Diagnosis not present

## 2019-01-09 DIAGNOSIS — N189 Chronic kidney disease, unspecified: Secondary | ICD-10-CM | POA: Diagnosis not present

## 2019-01-09 DIAGNOSIS — N2581 Secondary hyperparathyroidism of renal origin: Secondary | ICD-10-CM | POA: Diagnosis not present

## 2019-01-09 DIAGNOSIS — N184 Chronic kidney disease, stage 4 (severe): Secondary | ICD-10-CM | POA: Diagnosis not present

## 2019-01-09 DIAGNOSIS — I129 Hypertensive chronic kidney disease with stage 1 through stage 4 chronic kidney disease, or unspecified chronic kidney disease: Secondary | ICD-10-CM | POA: Diagnosis not present

## 2019-01-23 DIAGNOSIS — M329 Systemic lupus erythematosus, unspecified: Secondary | ICD-10-CM | POA: Diagnosis not present

## 2019-01-23 DIAGNOSIS — N189 Chronic kidney disease, unspecified: Secondary | ICD-10-CM | POA: Diagnosis not present

## 2019-02-21 DIAGNOSIS — N184 Chronic kidney disease, stage 4 (severe): Secondary | ICD-10-CM | POA: Diagnosis not present

## 2019-02-21 DIAGNOSIS — N189 Chronic kidney disease, unspecified: Secondary | ICD-10-CM | POA: Diagnosis not present

## 2019-02-21 DIAGNOSIS — M329 Systemic lupus erythematosus, unspecified: Secondary | ICD-10-CM | POA: Diagnosis not present

## 2019-02-21 DIAGNOSIS — N2581 Secondary hyperparathyroidism of renal origin: Secondary | ICD-10-CM | POA: Diagnosis not present

## 2019-02-21 DIAGNOSIS — I129 Hypertensive chronic kidney disease with stage 1 through stage 4 chronic kidney disease, or unspecified chronic kidney disease: Secondary | ICD-10-CM | POA: Diagnosis not present

## 2019-02-21 DIAGNOSIS — D631 Anemia in chronic kidney disease: Secondary | ICD-10-CM | POA: Diagnosis not present

## 2019-02-26 ENCOUNTER — Other Ambulatory Visit: Payer: Self-pay | Admitting: Internal Medicine

## 2019-02-26 DIAGNOSIS — N184 Chronic kidney disease, stage 4 (severe): Secondary | ICD-10-CM

## 2019-03-11 ENCOUNTER — Ambulatory Visit
Admission: RE | Admit: 2019-03-11 | Discharge: 2019-03-11 | Disposition: A | Discharge status: Home / Self Care | Payer: Medicare Other | Source: Ambulatory Visit | Attending: Internal Medicine | Admitting: Internal Medicine

## 2019-03-11 DIAGNOSIS — N184 Chronic kidney disease, stage 4 (severe): Secondary | ICD-10-CM

## 2019-03-12 ENCOUNTER — Other Ambulatory Visit (HOSPITAL_COMMUNITY): Payer: Self-pay | Admitting: *Deleted

## 2019-03-13 ENCOUNTER — Other Ambulatory Visit: Payer: Self-pay

## 2019-03-13 ENCOUNTER — Encounter (HOSPITAL_COMMUNITY)
Admission: RE | Admit: 2019-03-13 | Discharge: 2019-03-13 | Disposition: A | Discharge status: Home / Self Care | Payer: Medicare Other | Source: Ambulatory Visit | Attending: Internal Medicine | Admitting: Internal Medicine

## 2019-03-13 DIAGNOSIS — D631 Anemia in chronic kidney disease: Secondary | ICD-10-CM | POA: Insufficient documentation

## 2019-03-13 MED ORDER — SODIUM CHLORIDE 0.9 % IV SOLN
510.0000 mg | INTRAVENOUS | Status: DC
  Administered 2019-03-13: 510 mg via INTRAVENOUS
  Filled 2019-03-13: qty 17

## 2019-03-13 NOTE — Discharge Instructions (Signed)

## 2019-03-18 DIAGNOSIS — I129 Hypertensive chronic kidney disease with stage 1 through stage 4 chronic kidney disease, or unspecified chronic kidney disease: Secondary | ICD-10-CM | POA: Diagnosis not present

## 2019-03-18 DIAGNOSIS — N2581 Secondary hyperparathyroidism of renal origin: Secondary | ICD-10-CM | POA: Diagnosis not present

## 2019-03-18 DIAGNOSIS — N184 Chronic kidney disease, stage 4 (severe): Secondary | ICD-10-CM | POA: Diagnosis not present

## 2019-03-18 DIAGNOSIS — D631 Anemia in chronic kidney disease: Secondary | ICD-10-CM | POA: Diagnosis not present

## 2019-03-20 ENCOUNTER — Other Ambulatory Visit: Payer: Self-pay

## 2019-03-20 ENCOUNTER — Ambulatory Visit (HOSPITAL_COMMUNITY)
Admission: RE | Admit: 2019-03-20 | Discharge: 2019-03-20 | Disposition: A | Discharge status: Home / Self Care | Payer: Medicare Other | Source: Ambulatory Visit | Attending: Internal Medicine | Admitting: Internal Medicine

## 2019-03-20 DIAGNOSIS — N189 Chronic kidney disease, unspecified: Secondary | ICD-10-CM | POA: Insufficient documentation

## 2019-03-20 DIAGNOSIS — D631 Anemia in chronic kidney disease: Secondary | ICD-10-CM | POA: Insufficient documentation

## 2019-03-20 MED ORDER — SODIUM CHLORIDE 0.9 % IV SOLN
510.0000 mg | INTRAVENOUS | Status: AC
  Administered 2019-03-20: 510 mg via INTRAVENOUS
  Filled 2019-03-20: qty 510

## 2019-04-06 ENCOUNTER — Ambulatory Visit: Payer: Medicare Other

## 2019-04-14 ENCOUNTER — Ambulatory Visit: Payer: Medicare Other | Attending: Internal Medicine

## 2019-04-14 DIAGNOSIS — Z23 Encounter for immunization: Secondary | ICD-10-CM

## 2019-04-14 NOTE — Progress Notes (Signed)
   Covid-19 Vaccination Clinic  Name:  Debra Petersen    MRN: [id number] DOB: 05-Aug-1950  04/14/2019  [name] was observed post Covid-19 immunization for 15 minutes without incidence. She was provided with Vaccine Information Sheet and instruction to access the V-Safe system.   [name] was instructed to call 911 with any severe reactions post vaccine: Marland Kitchen Difficulty breathing  . Swelling of your face and throat  . A fast heartbeat  . A bad rash all over your body  . Dizziness and weakness    Immunizations Administered    Name Date Dose VIS Date Route   Pfizer COVID-19 Vaccine 04/14/2019  9:39 AM 0.3 mL 02/15/2019 Intramuscular   Manufacturer: [place]   Lot: [id number]   [place]: [id number]

## 2019-04-27 ENCOUNTER — Ambulatory Visit: Payer: Medicare Other

## 2019-05-09 ENCOUNTER — Ambulatory Visit: Payer: Medicare Other | Attending: Internal Medicine

## 2019-05-09 DIAGNOSIS — Z23 Encounter for immunization: Secondary | ICD-10-CM | POA: Insufficient documentation

## 2019-05-09 NOTE — Progress Notes (Signed)
   Covid-19 Vaccination Clinic  Name:  Debra Petersen    MRN: [id number] DOB: 07-Jun-1950  05/09/2019  [name] was observed post Covid-19 immunization for 15 minutes without incident. She was provided with Vaccine Information Sheet and instruction to access the V-Safe system.   [name] was instructed to call 911 with any severe reactions post vaccine: Marland Kitchen Difficulty breathing  . Swelling of face and throat  . A fast heartbeat  . A bad rash all over body  . Dizziness and weakness   Immunizations Administered    Name Date Dose VIS Date Route   Pfizer COVID-19 Vaccine 05/09/2019  8:30 AM 0.3 mL 02/15/2019 Intramuscular   Manufacturer: [place]   Lot: [id number]   [place]: [id number]

## 2019-05-13 DIAGNOSIS — Z0000 Encounter for general adult medical examination without abnormal findings: Secondary | ICD-10-CM | POA: Diagnosis not present

## 2019-05-13 DIAGNOSIS — M810 Age-related osteoporosis without current pathological fracture: Secondary | ICD-10-CM | POA: Diagnosis not present

## 2019-05-13 DIAGNOSIS — E7849 Other hyperlipidemia: Secondary | ICD-10-CM | POA: Diagnosis not present

## 2019-05-20 DIAGNOSIS — I129 Hypertensive chronic kidney disease with stage 1 through stage 4 chronic kidney disease, or unspecified chronic kidney disease: Secondary | ICD-10-CM | POA: Diagnosis not present

## 2019-05-20 DIAGNOSIS — N184 Chronic kidney disease, stage 4 (severe): Secondary | ICD-10-CM | POA: Diagnosis not present

## 2019-05-20 DIAGNOSIS — Z0000 Encounter for general adult medical examination without abnormal findings: Secondary | ICD-10-CM | POA: Diagnosis not present

## 2019-05-20 DIAGNOSIS — E785 Hyperlipidemia, unspecified: Secondary | ICD-10-CM | POA: Diagnosis not present

## 2019-05-20 DIAGNOSIS — Z1331 Encounter for screening for depression: Secondary | ICD-10-CM | POA: Diagnosis not present

## 2019-07-25 ENCOUNTER — Other Ambulatory Visit (HOSPITAL_COMMUNITY): Payer: Self-pay | Admitting: *Deleted

## 2019-07-26 ENCOUNTER — Ambulatory Visit (HOSPITAL_COMMUNITY)
Admission: RE | Admit: 2019-07-26 | Discharge: 2019-07-26 | Disposition: A | Discharge status: Home / Self Care | Payer: Medicare Other | Source: Ambulatory Visit | Attending: Internal Medicine | Admitting: Internal Medicine

## 2019-07-26 ENCOUNTER — Other Ambulatory Visit: Payer: Self-pay

## 2019-07-26 DIAGNOSIS — M810 Age-related osteoporosis without current pathological fracture: Secondary | ICD-10-CM | POA: Insufficient documentation

## 2019-07-26 MED ORDER — DENOSUMAB 60 MG/ML ~~LOC~~ SOSY
PREFILLED_SYRINGE | SUBCUTANEOUS | Status: AC
  Administered 2019-07-26: 60 mg via SUBCUTANEOUS
  Filled 2019-07-26: qty 1

## 2019-07-26 MED ORDER — DENOSUMAB 60 MG/ML ~~LOC~~ SOSY: 60.0000 mg | PREFILLED_SYRINGE | Freq: Once | SUBCUTANEOUS | Status: AC

## 2019-07-26 NOTE — Discharge Instructions (Signed)
Denosumab injection What is this medicine? DENOSUMAB (den oh sue mab) slows bone breakdown. Prolia is used to treat osteoporosis in women after menopause and in men, and in people who are taking corticosteroids for 6 months or more. Xgeva is used to treat a high calcium level due to cancer and to prevent bone fractures and other bone problems caused by multiple myeloma or cancer bone metastases. Xgeva is also used to treat giant cell tumor of the bone. This medicine may be used for other purposes; ask your health care provider or pharmacist if you have questions. COMMON BRAND NAME(S): Prolia, XGEVA What should I tell my health care provider before I take this medicine? They need to know if you have any of these conditions:  dental disease  having surgery or tooth extraction  infection  kidney disease  low levels of calcium or Vitamin D in the blood  malnutrition  on hemodialysis  skin conditions or sensitivity  thyroid or parathyroid disease  an unusual reaction to denosumab, other medicines, foods, dyes, or preservatives  pregnant or trying to get pregnant  breast-feeding How should I use this medicine? This medicine is for injection under the skin. It is given by a health care professional in a hospital or clinic setting. A special MedGuide will be given to you before each treatment. Be sure to read this information carefully each time. For Prolia, talk to your pediatrician regarding the use of this medicine in children. Special care may be needed. For Xgeva, talk to your pediatrician regarding the use of this medicine in children. While this drug may be prescribed for children as young as 13 years for selected conditions, precautions do apply. Overdosage: If you think you have taken too much of this medicine contact a poison control center or emergency room at once. NOTE: This medicine is only for you. Do not share this medicine with others. What if I miss a dose? It is  important not to miss your dose. Call your doctor or health care professional if you are unable to keep an appointment. What may interact with this medicine? Do not take this medicine with any of the following medications:  other medicines containing denosumab This medicine may also interact with the following medications:  medicines that lower your chance of fighting infection  steroid medicines like prednisone or cortisone This list may not describe all possible interactions. Give your health care provider a list of all the medicines, herbs, non-prescription drugs, or dietary supplements you use. Also tell them if you smoke, drink alcohol, or use illegal drugs. Some items may interact with your medicine. What should I watch for while using this medicine? Visit your doctor or health care professional for regular checks on your progress. Your doctor or health care professional may order blood tests and other tests to see how you are doing. Call your doctor or health care professional for advice if you get a fever, chills or sore throat, or other symptoms of a cold or flu. Do not treat yourself. This drug may decrease your body's ability to fight infection. Try to avoid being around people who are sick. You should make sure you get enough calcium and vitamin D while you are taking this medicine, unless your doctor tells you not to. Discuss the foods you eat and the vitamins you take with your health care professional. See your dentist regularly. Brush and floss your teeth as directed. Before you have any dental work done, tell your dentist you are   receiving this medicine. Do not become pregnant while taking this medicine or for 5 months after stopping it. Talk with your doctor or health care professional about your birth control options while taking this medicine. Women should inform their doctor if they wish to become pregnant or think they might be pregnant. There is a potential for serious side  effects to an unborn child. Talk to your health care professional or pharmacist for more information. What side effects may I notice from receiving this medicine? Side effects that you should report to your doctor or health care professional as soon as possible:  allergic reactions like skin rash, itching or hives, swelling of the face, lips, or tongue  bone pain  breathing problems  dizziness  jaw pain, especially after dental work  redness, blistering, peeling of the skin  signs and symptoms of infection like fever or chills; cough; sore throat; pain or trouble passing urine  signs of low calcium like fast heartbeat, muscle cramps or muscle pain; pain, tingling, numbness in the hands or feet; seizures  unusual bleeding or bruising  unusually weak or tired Side effects that usually do not require medical attention (report to your doctor or health care professional if they continue or are bothersome):  constipation  diarrhea  headache  joint pain  loss of appetite  muscle pain  runny nose  tiredness  upset stomach This list may not describe all possible side effects. Call your doctor for medical advice about side effects. You may report side effects to FDA at 1-800-FDA-1088. Where should I keep my medicine? This medicine is only given in a clinic, doctor's office, or other health care setting and will not be stored at home. NOTE: This sheet is a summary. It may not cover all possible information. If you have questions about this medicine, talk to your doctor, pharmacist, or health care provider.  2020 Elsevier/Gold Standard (2017-06-30 16:10:44)

## 2019-09-23 DIAGNOSIS — G629 Polyneuropathy, unspecified: Secondary | ICD-10-CM | POA: Insufficient documentation

## 2019-09-23 DIAGNOSIS — N189 Chronic kidney disease, unspecified: Secondary | ICD-10-CM

## 2019-09-23 DIAGNOSIS — Z01818 Encounter for other preprocedural examination: Secondary | ICD-10-CM | POA: Insufficient documentation

## 2019-09-23 DIAGNOSIS — I10 Essential (primary) hypertension: Secondary | ICD-10-CM | POA: Diagnosis present

## 2019-09-23 DIAGNOSIS — N185 Chronic kidney disease, stage 5: Secondary | ICD-10-CM | POA: Insufficient documentation

## 2019-09-23 DIAGNOSIS — M329 Systemic lupus erythematosus, unspecified: Secondary | ICD-10-CM | POA: Insufficient documentation

## 2019-10-02 ENCOUNTER — Inpatient Hospital Stay (HOSPITAL_COMMUNITY)
Admission: EM | Admit: 2019-10-02 | Discharge: 2019-10-06 | DRG: 683 | Disposition: A | Discharge status: Home / Self Care | Payer: Medicare Other | Attending: Family Medicine | Admitting: Family Medicine

## 2019-10-02 ENCOUNTER — Other Ambulatory Visit: Payer: Self-pay

## 2019-10-02 ENCOUNTER — Encounter (HOSPITAL_COMMUNITY): Payer: Self-pay | Admitting: Pediatrics

## 2019-10-02 DIAGNOSIS — E039 Hypothyroidism, unspecified: Secondary | ICD-10-CM | POA: Diagnosis present

## 2019-10-02 DIAGNOSIS — N189 Chronic kidney disease, unspecified: Secondary | ICD-10-CM | POA: Diagnosis present

## 2019-10-02 DIAGNOSIS — Z8249 Family history of ischemic heart disease and other diseases of the circulatory system: Secondary | ICD-10-CM

## 2019-10-02 DIAGNOSIS — M329 Systemic lupus erythematosus, unspecified: Secondary | ICD-10-CM | POA: Diagnosis present

## 2019-10-02 DIAGNOSIS — N179 Acute kidney failure, unspecified: Secondary | ICD-10-CM | POA: Diagnosis present

## 2019-10-02 DIAGNOSIS — E860 Dehydration: Secondary | ICD-10-CM | POA: Diagnosis present

## 2019-10-02 DIAGNOSIS — I10 Essential (primary) hypertension: Secondary | ICD-10-CM | POA: Diagnosis present

## 2019-10-02 DIAGNOSIS — N184 Chronic kidney disease, stage 4 (severe): Secondary | ICD-10-CM | POA: Diagnosis present

## 2019-10-02 DIAGNOSIS — D693 Immune thrombocytopenic purpura: Secondary | ICD-10-CM | POA: Diagnosis present

## 2019-10-02 DIAGNOSIS — I4892 Unspecified atrial flutter: Secondary | ICD-10-CM | POA: Diagnosis present

## 2019-10-02 DIAGNOSIS — Z79899 Other long term (current) drug therapy: Secondary | ICD-10-CM

## 2019-10-02 DIAGNOSIS — R197 Diarrhea, unspecified: Secondary | ICD-10-CM | POA: Diagnosis present

## 2019-10-02 DIAGNOSIS — N2581 Secondary hyperparathyroidism of renal origin: Secondary | ICD-10-CM | POA: Diagnosis present

## 2019-10-02 DIAGNOSIS — Z9081 Acquired absence of spleen: Secondary | ICD-10-CM

## 2019-10-02 DIAGNOSIS — N185 Chronic kidney disease, stage 5: Secondary | ICD-10-CM | POA: Diagnosis present

## 2019-10-02 DIAGNOSIS — I129 Hypertensive chronic kidney disease with stage 1 through stage 4 chronic kidney disease, or unspecified chronic kidney disease: Principal | ICD-10-CM | POA: Diagnosis present

## 2019-10-02 DIAGNOSIS — M545 Low back pain: Secondary | ICD-10-CM | POA: Diagnosis present

## 2019-10-02 DIAGNOSIS — Z20822 Contact with and (suspected) exposure to covid-19: Secondary | ICD-10-CM | POA: Diagnosis present

## 2019-10-02 DIAGNOSIS — D649 Anemia, unspecified: Secondary | ICD-10-CM | POA: Diagnosis present

## 2019-10-02 HISTORY — DX: Chronic kidney disease, unspecified: N18.9

## 2019-10-02 LAB — COMPREHENSIVE METABOLIC PANEL
ALT: 9 U/L (ref 0–44)
AST: 11 U/L — ABNORMAL LOW (ref 15–41)
Albumin: 3.5 g/dL (ref 3.5–5.0)
Alkaline Phosphatase: 62 U/L (ref 38–126)
Anion gap: 9 (ref 5–15)
BUN: 41 mg/dL — ABNORMAL HIGH (ref 8–23)
CO2: 12 mmol/L — ABNORMAL LOW (ref 22–32)
Calcium: 7.8 mg/dL — ABNORMAL LOW (ref 8.9–10.3)
Chloride: 116 mmol/L — ABNORMAL HIGH (ref 98–111)
Creatinine, Ser: 4.69 mg/dL — ABNORMAL HIGH (ref 0.44–1.00)
GFR calc Af Amer: 10 mL/min — ABNORMAL LOW (ref >60)
GFR calc non Af Amer: 9 mL/min — ABNORMAL LOW (ref >60)
Glucose, Bld: 130 mg/dL — ABNORMAL HIGH (ref 70–99)
Potassium: 3.7 mmol/L (ref 3.5–5.1)
Sodium: 137 mmol/L (ref 135–145)
Total Bilirubin: 0.6 mg/dL (ref 0.3–1.2)
Total Protein: 6.9 g/dL (ref 6.5–8.1)

## 2019-10-02 LAB — CBC WITH DIFFERENTIAL/PLATELET
Abs Immature Granulocytes: 0.02 10*3/uL (ref 0.00–0.07)
Basophils Absolute: 0.1 10*3/uL (ref 0.0–0.1)
Basophils Relative: 1 %
Eosinophils Absolute: 0.2 10*3/uL (ref 0.0–0.5)
Eosinophils Relative: 2 %
HCT: 31.7 % — ABNORMAL LOW (ref 36.0–46.0)
Hemoglobin: 10.3 g/dL — ABNORMAL LOW (ref 12.0–15.0)
Immature Granulocytes: 0 %
Lymphocytes Relative: 38 %
Lymphs Abs: 3 10*3/uL (ref 0.7–4.0)
MCH: 32.5 pg (ref 26.0–34.0)
MCHC: 32.5 g/dL (ref 30.0–36.0)
MCV: 100 fL (ref 80.0–100.0)
Monocytes Absolute: 0.7 10*3/uL (ref 0.1–1.0)
Monocytes Relative: 9 %
Neutro Abs: 3.9 10*3/uL (ref 1.7–7.7)
Neutrophils Relative %: 50 %
Platelets: 303 10*3/uL (ref 150–400)
RBC: 3.17 MIL/uL — ABNORMAL LOW (ref 3.87–5.11)
RDW: 17.4 % — ABNORMAL HIGH (ref 11.5–15.5)
WBC: 7.9 10*3/uL (ref 4.0–10.5)
nRBC: 0.3 % — ABNORMAL HIGH (ref 0.0–0.2)

## 2019-10-02 NOTE — ED Triage Notes (Signed)
Patient stated she was told by her PCP to be further evaluated in ED for dehydration. C/o weakness.

## 2019-10-03 ENCOUNTER — Inpatient Hospital Stay (HOSPITAL_COMMUNITY): Payer: Medicare Other

## 2019-10-03 ENCOUNTER — Emergency Department (HOSPITAL_COMMUNITY): Payer: Medicare Other

## 2019-10-03 DIAGNOSIS — M329 Systemic lupus erythematosus, unspecified: Secondary | ICD-10-CM | POA: Diagnosis present

## 2019-10-03 DIAGNOSIS — Z20822 Contact with and (suspected) exposure to covid-19: Secondary | ICD-10-CM | POA: Diagnosis present

## 2019-10-03 DIAGNOSIS — D693 Immune thrombocytopenic purpura: Secondary | ICD-10-CM | POA: Diagnosis present

## 2019-10-03 DIAGNOSIS — N2581 Secondary hyperparathyroidism of renal origin: Secondary | ICD-10-CM | POA: Diagnosis present

## 2019-10-03 DIAGNOSIS — N185 Chronic kidney disease, stage 5: Secondary | ICD-10-CM | POA: Diagnosis present

## 2019-10-03 DIAGNOSIS — I129 Hypertensive chronic kidney disease with stage 1 through stage 4 chronic kidney disease, or unspecified chronic kidney disease: Secondary | ICD-10-CM | POA: Diagnosis present

## 2019-10-03 DIAGNOSIS — N189 Chronic kidney disease, unspecified: Secondary | ICD-10-CM

## 2019-10-03 DIAGNOSIS — E860 Dehydration: Secondary | ICD-10-CM | POA: Diagnosis present

## 2019-10-03 DIAGNOSIS — R197 Diarrhea, unspecified: Secondary | ICD-10-CM | POA: Diagnosis present

## 2019-10-03 DIAGNOSIS — D649 Anemia, unspecified: Secondary | ICD-10-CM | POA: Diagnosis present

## 2019-10-03 DIAGNOSIS — I4892 Unspecified atrial flutter: Secondary | ICD-10-CM | POA: Diagnosis present

## 2019-10-03 DIAGNOSIS — N184 Chronic kidney disease, stage 4 (severe): Secondary | ICD-10-CM | POA: Diagnosis present

## 2019-10-03 DIAGNOSIS — Z79899 Other long term (current) drug therapy: Secondary | ICD-10-CM | POA: Diagnosis not present

## 2019-10-03 DIAGNOSIS — N179 Acute kidney failure, unspecified: Secondary | ICD-10-CM | POA: Diagnosis present

## 2019-10-03 DIAGNOSIS — Z8249 Family history of ischemic heart disease and other diseases of the circulatory system: Secondary | ICD-10-CM | POA: Diagnosis not present

## 2019-10-03 DIAGNOSIS — Z9081 Acquired absence of spleen: Secondary | ICD-10-CM | POA: Diagnosis not present

## 2019-10-03 DIAGNOSIS — M545 Low back pain: Secondary | ICD-10-CM | POA: Diagnosis present

## 2019-10-03 DIAGNOSIS — E039 Hypothyroidism, unspecified: Secondary | ICD-10-CM | POA: Diagnosis present

## 2019-10-03 LAB — URINALYSIS, ROUTINE W REFLEX MICROSCOPIC
Bilirubin Urine: NEGATIVE
Glucose, UA: NEGATIVE mg/dL
Ketones, ur: NEGATIVE mg/dL
Nitrite: NEGATIVE
Protein, ur: NEGATIVE mg/dL
Specific Gravity, Urine: 1.005 (ref 1.005–1.030)
pH: 5 (ref 5.0–8.0)

## 2019-10-03 LAB — C DIFFICILE QUICK SCREEN W PCR REFLEX
C Diff antigen: NEGATIVE
C Diff interpretation: NOT DETECTED
C Diff toxin: NEGATIVE

## 2019-10-03 LAB — CREATININE, URINE, RANDOM: Creatinine, Urine: 55.43 mg/dL

## 2019-10-03 LAB — CK: Total CK: 67 U/L (ref 38–234)

## 2019-10-03 LAB — SODIUM, URINE, RANDOM: Sodium, Ur: 36 mmol/L

## 2019-10-03 LAB — SARS CORONAVIRUS 2 BY RT PCR (HOSPITAL ORDER, PERFORMED IN ~~LOC~~ HOSPITAL LAB): SARS Coronavirus 2: NEGATIVE

## 2019-10-03 MED ORDER — PROPAFENONE HCL 150 MG PO TABS
150.0000 mg | ORAL_TABLET | Freq: Every day | ORAL | Status: DC
  Administered 2019-10-03 – 2019-10-06 (×4): 150 mg via ORAL
  Filled 2019-10-03 (×5): qty 1

## 2019-10-03 MED ORDER — ONDANSETRON HCL 4 MG PO TABS
4.0000 mg | ORAL_TABLET | Freq: Four times a day (QID) | ORAL | Status: DC | PRN
  Administered 2019-10-06: 4 mg via ORAL
  Filled 2019-10-03: qty 1

## 2019-10-03 MED ORDER — TRAMADOL HCL 50 MG PO TABS
50.0000 mg | ORAL_TABLET | Freq: Two times a day (BID) | ORAL | Status: DC | PRN
  Administered 2019-10-03 – 2019-10-05 (×5): 50 mg via ORAL
  Filled 2019-10-03 (×5): qty 1

## 2019-10-03 MED ORDER — PROPRANOLOL HCL 10 MG PO TABS
10.0000 mg | ORAL_TABLET | Freq: Every day | ORAL | Status: DC
  Filled 2019-10-03: qty 1

## 2019-10-03 MED ORDER — ENOXAPARIN SODIUM 30 MG/0.3ML ~~LOC~~ SOLN: 30.0000 mg | SUBCUTANEOUS | Status: DC

## 2019-10-03 MED ORDER — SODIUM CHLORIDE 0.9 % IV SOLN: INTRAVENOUS | Status: DC

## 2019-10-03 MED ORDER — SODIUM CHLORIDE 0.9 % IV BOLUS
1000.0000 mL | Freq: Once | INTRAVENOUS | Status: AC
  Administered 2019-10-03: 1000 mL via INTRAVENOUS

## 2019-10-03 MED ORDER — ONDANSETRON HCL 4 MG/2ML IJ SOLN
4.0000 mg | Freq: Four times a day (QID) | INTRAMUSCULAR | Status: DC | PRN
  Administered 2019-10-05 (×2): 4 mg via INTRAVENOUS
  Filled 2019-10-03 (×2): qty 2

## 2019-10-03 NOTE — H&P (Signed)
PCP:   Prince Solian, MD  Nephrologist: Dr Honor Junes  Chief Complaint:  Diarrhea  HPI: This is a 69 year old female who presents with complaint of significant diarrhea.  The patient states approximately 4 weeks ago she developed really bad back pain.  It improved and returned 3 weeks ago.  The pain was continuous, throbbing and radiated down both legs.  It eventually improved with heat, cold outside needed.  then she got another flare.  Approximately 2 weeks ago which she developed lots of diarrhea.  She had lots of burping, belching, nausea but no vomiting.  She has had a poor appetite.  Her diarrhea was severe enough that she had frequent incontinent episodes. She eventually started to wear pampers.  Last night she had a bad accident while she was sleeping.  She finally went and saw her PCP who did blood work, collected stool and urine samples.  Blood results came in and the patient was called and told to go to the ER as her electrolytes were all off.  The patient reports decreased urine output but that was only yesterday.  She denies any fevers or chills.  She states she still feels a bit bloated and grippy in her stomach but she has not had any diarrhea in the last 18 - 24 hours.  The patient has chronic kidney injury and is on waiting list for transplant at [facility]. She is not on HD.  Here in the ER the patient labs shows acute on chronic kidney injury.  The hospitalist have been asked to admit.  Review of Systems:  The patient denies anorexia, fever, weight loss,, vision loss, decreased hearing, hoarseness, chest pain, syncope, dyspnea on exertion, peripheral edema, balance deficits, hemoptysis, abdominal pain, melena, hematochezia, severe indigestion/heartburn, hematuria, incontinence, genital sores, muscle weakness, suspicious skin lesions, transient blindness, difficulty walking, depression, unusual weight change, abnormal bleeding, enlarged lymph nodes, angioedema, and breast  masses. Positive: diarrhea, hiccups, lightheaded and dizzy, nausea  Past Medical History: Past Medical History:  Diagnosis Date  . Lupus (systemic lupus erythematosus) ([place])   . Wide-complex tachycardia ([facility])    probable atrial flutter with 1-1 and 2-1 conduction   Past Surgical History:  Procedure Laterality Date  . CARDIAC ELECTROPHYSIOLOGY STUDY AND ABLATION  02/23/2009   of typical and atypical flutter  . COLON SURGERY    . GALLBLADDER SURGERY    . hystoectomy    . SPLENECTOMY, TOTAL      Medications: Prior to Admission medications   Medication Sig Start Date End Date Taking? Authorizing Provider  calcitRIOL (ROCALTROL) 0.25 MCG capsule Take 0.25 mcg by mouth daily. 09/16/19  Yes [provider]  Cholecalciferol (D3 ADULT PO) Take 2 tablets by mouth daily.    Yes [provider]  pravastatin (PRAVACHOL) 20 MG tablet Take 20 mg by mouth daily.   Yes [provider]  propafenone (RYTHMOL) 150 MG tablet TAKE 1 TABLET BY MOUTH TWICE DAILY. Patient taking differently: Take 150 mg by mouth daily.  10/03/18  Yes Deboraha Sprang, MD  propranolol (INDERAL) 10 MG tablet Take one tablet by mouth as directed for heart racing/ palpitations Patient must keep 03/2018 appointment for further refills Patient taking differently: Take 10 mg by mouth daily. Take one tablet by mouth as directed for heart racing/ palpitations Patient must keep 03/2018 appointment for further refills 12/29/17  Yes Deboraha Sprang, MD  sodium bicarbonate 650 MG tablet Take 650 mg by mouth 2 (two) times daily. 09/14/19  Yes [provider]  Allergies:   Allergies  Allergen Reactions  . Codeine Itching    Social History:  reports that she has never smoked. She has never used smokeless tobacco. No history on file for alcohol use and drug use.  Family History: Family History  Problem Relation Age of Onset  . Coronary artery disease Mother        had a pacemaker  . Coronary  artery disease Father     Physical Exam: Vitals:   10/03/19 0307 10/03/19 0330 10/03/19 0445 10/03/19 0500  BP:  (!) 116/54 (!) 124/64 (!) 115/58  Pulse: 94 95 99 103  Resp:  12 15 20   Temp:      TempSrc:      SpO2: 100% 98% 98% 97%  Weight:      Height:        General:  Alert and oriented times three, well developed and nourished, no acute distress Eyes: PERRLA, pink conjunctiva, no scleral icterus ENT: Moist oral mucosa, neck supple, no thyromegaly Lungs: clear to ascultation, no wheeze, no crackles, no use of accessory muscles Cardiovascular: regular rate and rhythm, no regurgitation, no gallops, no murmurs. No carotid bruits, no JVD Abdomen: soft, positive BS, non-tender, non-distended, no organomegaly, not an acute abdomen GU: not examined Neuro: CN II - XII grossly intact, sensation intact Musculoskeletal: strength 5/5 all extremities, no clubbing, cyanosis or edema Skin: no rash, no subcutaneous crepitation, no decubitus Psych: appropriate patient Abd: mild TTP RLQ Back: Lumbar TTP region   Labs on Admission:  Recent Labs    10/02/19 1621  NA 137  K 3.7  CL 116*  CO2 12*  GLUCOSE 130*  BUN 41*  CREATININE 4.69*  CALCIUM 7.8*   Recent Labs    10/02/19 1621  AST 11*  ALT 9  ALKPHOS 62  BILITOT 0.6  PROT 6.9  ALBUMIN 3.5   No results for input(s): LIPASE, AMYLASE in the last 72 hours. Recent Labs    10/02/19 1621  WBC 7.9  NEUTROABS 3.9  HGB 10.3*  HCT 31.7*  MCV 100.0  PLT 303   No results for input(s): CKTOTAL, CKMB, CKMBINDEX, TROPONINI in the last 72 hours. Invalid input(s): POCBNP No results for input(s): DDIMER in the last 72 hours. No results for input(s): HGBA1C in the last 72 hours. No results for input(s): CHOL, HDL, LDLCALC, TRIG, CHOLHDL, LDLDIRECT in the last 72 hours. No results for input(s): TSH, T4TOTAL, T3FREE, THYROIDAB in the last 72 hours.  Invalid input(s): FREET3 No results for input(s): VITAMINB12, FOLATE, FERRITIN,  TIBC, IRON, RETICCTPCT in the last 72 hours.  Micro Results: No results found for this or any previous visit (from the past 240 hour(s)).   Radiological Exams on Admission: CT ABDOMEN PELVIS WO CONTRAST  Result Date: 10/03/2019 CLINICAL DATA:  Nausea vomiting EXAM: CT ABDOMEN AND PELVIS WITHOUT CONTRAST TECHNIQUE: Multidetector CT imaging of the abdomen and pelvis was performed following the standard protocol without IV contrast. COMPARISON:  None. FINDINGS: Lower chest: The visualized heart size within normal limits. No pericardial fluid/thickening. No hiatal hernia. The visualized portions of the lungs are clear. Hepatobiliary: Although limited due to the lack of intravenous contrast, normal in appearance without gross focal abnormality. The patient is status post cholecystectomy. No biliary ductal dilation. Pancreas:  Unremarkable.  No surrounding inflammatory changes. Spleen: The patient is status post splenectomy. Adrenals/Urinary Tract: Both adrenal glands appear normal. The kidneys and collecting system appear normal without evidence of urinary tract calculus or hydronephrosis. Bladder is unremarkable. Stomach/Bowel:  There is a mildly distended stomach filled with air and food stuff. The remainder of the small bowel is unremarkable. There is a mildly dilated sigmoid colon adjacent to the anastomosis with air and stool. Mildly distended sigmoid colon seen down to the level of the rectum. Vascular/Lymphatic: There are no enlarged abdominal or pelvic lymph nodes. Scattered aortic atherosclerotic calcifications are seen without aneurysmal dilatation. Reproductive: The patient is status post hysterectomy. No adnexal masses or collections seen. Other: No evidence of abdominal wall mass or hernia. Musculoskeletal: No acute or significant osseous findings. IMPRESSION: Mildly dilated sigmoid colon adjacent to the anastomosis which could be due to mild ileus or partial bowel obstruction. Aortic Atherosclerosis  (ICD10-I70.0). Electronically Signed   By: Prudencio Pair M.D.   On: 10/03/2019 03:04    Assessment/Plan Present on Admission: . Acute kidney injury superimposed on chronic kidney disease ([place]) . Dehydration -Bring in for overnight observation on MedSurg -Gentle IV fluid hydration -Strict I's and O's, no nephrotoxic medications -BMP in the morning  . Diarrhea -Resolving, stool cultures and C. difficile PCR ordered  ??mild ileus or partial bowel obstruction -Physical examination is consistent with an ileus.  Patient has present but per sluggish bowel sounds.  Clear liquid diet ordered.  Continue to monitor  . HYPERTENSION, BENIGN -Stable, home meds resumed  . Hypothyroidism -Recent diagnosis, not currently on medications  . ATRIAL FLUTTER -Stable, home meds resumed  Breindy Meadow 10/03/2019, 5:24 AM

## 2019-10-03 NOTE — ED Notes (Signed)
Lunch Tray Ordered @ 1008. 

## 2019-10-03 NOTE — ED Notes (Signed)
Pt requesting a 3rd lunch tray. Additional tray ordered.

## 2019-10-03 NOTE — Progress Notes (Signed)
NEW ADMISSION NOTE New Admission Note:   Arrival Method: Stretcher Mental Orientation: A&O X4 Telemetry: [id number] Assessment: Completed Skin: WDL IV: WDL Pain: 7/10 Safety Measures: Safety Fall Prevention Plan has been given, discussed and signed Admission: Completed [facility] Orientation: Patient has been orientated to the room, unit and staff.   Orders have been reviewed and implemented. Will continue to monitor the patient. Call light has been placed within reach and bed alarm has been activated.   Aneta Mins BSN, RN3

## 2019-10-03 NOTE — ED Notes (Signed)
Patient transported to CT 

## 2019-10-03 NOTE — Plan of Care (Signed)
  Problem: Education: Goal: Knowledge of General Education information will improve Description: Including pain rating scale, medication(s)/side effects and non-pharmacologic comfort measures Outcome: Completed/Met   Problem: Health Behavior/Discharge Planning: Goal: Ability to manage health-related needs will improve Outcome: Completed/Met   Problem: Clinical Measurements: Goal: Diagnostic test results will improve Outcome: Completed/Met Goal: Respiratory complications will improve Outcome: Completed/Met Goal: Cardiovascular complication will be avoided Outcome: Completed/Met   Problem: Activity: Goal: Risk for activity intolerance will decrease Outcome: Completed/Met

## 2019-10-03 NOTE — ED Notes (Signed)
Pt placed on bedside commode at this time, pt able to transfer self to bedside without assistance. Pt reports last diarrhea episode early 10/02/19, reportedly " able to keep meal down for the first time in days". Pt reportedly intermittent nausea for past few days, denies vomiting episodes, pt reports diarrhea as thin watery stool. Pt reports wearing to depends for bowel incontinent with diarrhea episodes.

## 2019-10-03 NOTE — ED Notes (Signed)
Patient's request more jello-Nutrition Service called @ 1310.

## 2019-10-03 NOTE — Progress Notes (Signed)
Patient seen and examined and agree with plan of care according to Dr. Laqueta Linden who saw this patient  69 year old lady admitted last 02/2009 with wide-complex tachycardia had flutter ablation at the time was on Rythmol/Inderal and was sent home on aspirin as flutter ablation wasSuccessful Prior bowel perforation temporary ileostomy Previously on Cytoxan and steroids ITP and splenectomy 1972 Prior transverse myelitis History of lupus diagnosed 1990 history of CKD stage 4 with last GFR 14 followed at [facility] and apparently is on transplant list Per care everywhere apparently had a CT scan 09/16/2019 showing hypodense hyperdense lesions with cysts in the kidneys? At baseline with secondary hyperparathyroidism 3-week history as dictated above with diarrhea Severe AKI on admission  Patient does see Dr. Danie Chandler of [facility] for routine nephrology follow-up and in fact came to the emergency room at both her and Dr. Philbert Riser recommendation on 7/28  Being treated for the same C. difficile pending has just passed stool  Plan Follow C. difficileThat was just collected Reconcile meds-does not appear to be based on discussion with patient at bedside propranolol but is on Rythmol which will be resumed Ensure telemetry does not show the same over the next 24 hours in terms of sinus tach Check a.m. magnesium Continue clear liquid diet and hopefully can advance as tolerated Does have a history of recent hypothyroidism per patient and is not on meds If kidney function worsens this may accelerate our work-up and she may need nephrology input during this hospitalization Obtain FEna for completion sake although this is likely secondary to volume depletion which is acute from her diarrhea  I had a long discussion with her and her son at the bedside in the emergency room we will place her on a telemetry bed with the hope that she can discharge home as early as 7/30  Verneita Griffes, MD [facility] 1:56 PM

## 2019-10-03 NOTE — ED Notes (Signed)
Pt ambulated to bathroom unassisted. Pt steady on her feet.

## 2019-10-03 NOTE — ED Provider Notes (Signed)
[facility] EMERGENCY DEPARTMENT Provider Note   CSN: [id number] Arrival date & time: 10/02/19  1544     History Chief Complaint  Patient presents with  . Dehydration    Debra Petersen is a 69 y.o. female.  Patient was referred to the emergency department by her doctor after being seen today.  Patient has had approximately 10 days of diarrhea with abdominal discomfort.  She was sent to the ED to be evaluated for likely dehydration.  She reports that she has not had any diarrhea since around noon today.  She has intermittent abdominal cramping but no continuous pain.  No fever.  She has not had any recent antibiotic treatment.        Past Medical History:  Diagnosis Date  . Lupus (systemic lupus erythematosus) ([place])   . Wide-complex tachycardia (HCC)    probable atrial flutter with 1-1 and 2-1 conduction    Patient Active Problem List   Diagnosis Date Noted  . Stress due to illness of family member 03/19/2013  . HYPERTENSION, BENIGN 12/03/2009  . UNSPECIFIED HYPOTHYROIDISM 04/02/2009  . SVT/ PSVT/ PAT 04/02/2009  . ATRIAL FLUTTER 04/02/2009  . ABNORMAL ELECTROCARDIOGRAM 04/02/2009    Past Surgical History:  Procedure Laterality Date  . CARDIAC ELECTROPHYSIOLOGY STUDY AND ABLATION  02/23/2009   of typical and atypical flutter  . COLON SURGERY    . GALLBLADDER SURGERY    . hystoectomy    . SPLENECTOMY, TOTAL       OB History   No obstetric history on file.     Family History  Problem Relation Age of Onset  . Coronary artery disease Mother        had a pacemaker  . Coronary artery disease Father     Social History   Tobacco Use  . Smoking status: Never Smoker  . Smokeless tobacco: Never Used  Vaping Use  . Vaping Use: Never used  Substance Use Topics  . Alcohol use: Not on file  . Drug use: Not on file    Home Medications Prior to Admission medications   Medication Sig Start Date End Date Taking? Authorizing Provider  calcitRIOL  (ROCALTROL) 0.25 MCG capsule Take 0.25 mcg by mouth daily. 09/16/19  Yes [provider]  Cholecalciferol (D3 ADULT PO) Take 2 tablets by mouth daily.    Yes [provider]  pravastatin (PRAVACHOL) 20 MG tablet Take 20 mg by mouth daily.   Yes [provider]  propafenone (RYTHMOL) 150 MG tablet TAKE 1 TABLET BY MOUTH TWICE DAILY. Patient taking differently: Take 150 mg by mouth daily.  10/03/18  Yes Deboraha Sprang, MD  propranolol (INDERAL) 10 MG tablet Take one tablet by mouth as directed for heart racing/ palpitations Patient must keep 03/2018 appointment for further refills Patient taking differently: Take 10 mg by mouth daily. Take one tablet by mouth as directed for heart racing/ palpitations Patient must keep 03/2018 appointment for further refills 12/29/17  Yes Deboraha Sprang, MD  sodium bicarbonate 650 MG tablet Take 650 mg by mouth 2 (two) times daily. 09/14/19  Yes [provider]    Allergies    Codeine  Review of Systems   Review of Systems  Constitutional: Positive for fatigue.  Gastrointestinal: Positive for abdominal pain and diarrhea.  All other systems reviewed and are negative.   Physical Exam Updated Vital Signs BP (!) 132/62 (BP Location: Left Arm)   Pulse 94   Temp 98.4 F (36.9 C) (Oral)  Resp 19   Ht 5\' 3"  (1.6 m)   Wt 51.7 kg   SpO2 100%   BMI 20.19 kg/m   Physical Exam Vitals and nursing note reviewed.  Constitutional:      General: She is not in acute distress.    Appearance: Normal appearance. She is well-developed.  HENT:     Head: Normocephalic and atraumatic.     Right Ear: Hearing normal.     Left Ear: Hearing normal.     Nose: Nose normal.  Eyes:     Conjunctiva/sclera: Conjunctivae normal.     Pupils: Pupils are equal, round, and reactive to light.  Cardiovascular:     Rate and Rhythm: Regular rhythm.     Heart sounds: S1 normal and S2 normal. No murmur heard.  No friction rub. No gallop.     Pulmonary:     Effort: Pulmonary effort is normal. No respiratory distress.     Breath sounds: Normal breath sounds.  Chest:     Chest wall: No tenderness.  Abdominal:     General: Bowel sounds are normal.     Palpations: Abdomen is soft.     Tenderness: There is generalized abdominal tenderness. There is no guarding or rebound. Negative signs include Murphy's sign and McBurney's sign.     Hernia: No hernia is present.  Musculoskeletal:        General: Normal range of motion.     Cervical back: Normal range of motion and neck supple.  Skin:    General: Skin is warm and dry.     Findings: No rash.  Neurological:     Mental Status: She is alert and oriented to person, place, and time.     GCS: GCS eye subscore is 4. GCS verbal subscore is 5. GCS motor subscore is 6.     Cranial Nerves: No cranial nerve deficit.     Sensory: No sensory deficit.     Coordination: Coordination normal.  Psychiatric:        Speech: Speech normal.        Behavior: Behavior normal.        Thought Content: Thought content normal.     ED Results / Procedures / Treatments   Labs (all labs ordered are listed, but only abnormal results are displayed) Labs Reviewed  CBC WITH DIFFERENTIAL/PLATELET - Abnormal; Notable for the following components:      Result Value   RBC 3.17 (*)    Hemoglobin 10.3 (*)    HCT 31.7 (*)    RDW 17.4 (*)    nRBC 0.3 (*)    All other components within normal limits  COMPREHENSIVE METABOLIC PANEL - Abnormal; Notable for the following components:   Chloride 116 (*)    CO2 12 (*)    Glucose, Bld 130 (*)    BUN 41 (*)    Creatinine, Ser 4.69 (*)    Calcium 7.8 (*)    AST 11 (*)    GFR calc non Af Amer 9 (*)    GFR calc Af Amer 10 (*)    All other components within normal limits  GASTROINTESTINAL PANEL BY PCR, STOOL (REPLACES STOOL CULTURE)  C DIFFICILE QUICK SCREEN W PCR REFLEX  SARS CORONAVIRUS 2 BY RT PCR (HOSPITAL ORDER, [facility])   URINALYSIS, ROUTINE W REFLEX MICROSCOPIC    EKG None  Radiology CT ABDOMEN PELVIS WO CONTRAST  Result Date: 10/03/2019 CLINICAL DATA:  Nausea vomiting EXAM: CT ABDOMEN AND PELVIS  WITHOUT CONTRAST TECHNIQUE: Multidetector CT imaging of the abdomen and pelvis was performed following the standard protocol without IV contrast. COMPARISON:  None. FINDINGS: Lower chest: The visualized heart size within normal limits. No pericardial fluid/thickening. No hiatal hernia. The visualized portions of the lungs are clear. Hepatobiliary: Although limited due to the lack of intravenous contrast, normal in appearance without gross focal abnormality. The patient is status post cholecystectomy. No biliary ductal dilation. Pancreas:  Unremarkable.  No surrounding inflammatory changes. Spleen: The patient is status post splenectomy. Adrenals/Urinary Tract: Both adrenal glands appear normal. The kidneys and collecting system appear normal without evidence of urinary tract calculus or hydronephrosis. Bladder is unremarkable. Stomach/Bowel: There is a mildly distended stomach filled with air and food stuff. The remainder of the small bowel is unremarkable. There is a mildly dilated sigmoid colon adjacent to the anastomosis with air and stool. Mildly distended sigmoid colon seen down to the level of the rectum. Vascular/Lymphatic: There are no enlarged abdominal or pelvic lymph nodes. Scattered aortic atherosclerotic calcifications are seen without aneurysmal dilatation. Reproductive: The patient is status post hysterectomy. No adnexal masses or collections seen. Other: No evidence of abdominal wall mass or hernia. Musculoskeletal: No acute or significant osseous findings. IMPRESSION: Mildly dilated sigmoid colon adjacent to the anastomosis which could be due to mild ileus or partial bowel obstruction. Aortic Atherosclerosis (ICD10-I70.0). Electronically Signed   By: Prudencio Pair M.D.   On: 10/03/2019 03:04     Procedures Procedures (including critical care time)  Medications Ordered in ED Medications  sodium chloride 0.9 % bolus 1,000 mL (1,000 mLs Intravenous New Bag/Given 10/03/19 0235)    ED Course  I have reviewed the triage vital signs and the nursing notes.  Pertinent labs & imaging results that were available during my care of the patient were reviewed by me and considered in my medical decision making (see chart for details).    MDM Rules/Calculators/A&P                          Patient presents to the emergency department for evaluation of dehydration. Patient does have a history of end-stage renal disease, baseline creatinine from 2 weeks ago was 2.87. Patient reports that she has had approximately 10 days of diarrhea. This has resulted in an acute kidney injury and dehydration. CT scan does not show any signs of infection. There is a mildly dilated sigmoid colon adjacent to anastomosis which could represent ileus or partial obstruction. Will admit patient for further management including IV hydration.  Final Clinical Impression(s) / ED Diagnoses Final diagnoses:  AKI (acute kidney injury) ([place])  Dehydration  Diarrhea, unspecified type    Rx / DC Orders ED Discharge Orders    None       Moneka Mcquinn, Gwenyth Allegra, MD 10/03/19 [phone]

## 2019-10-04 ENCOUNTER — Encounter (HOSPITAL_COMMUNITY): Payer: Self-pay | Admitting: Family Medicine

## 2019-10-04 LAB — GASTROINTESTINAL PANEL BY PCR, STOOL (REPLACES STOOL CULTURE)

## 2019-10-04 LAB — COMPREHENSIVE METABOLIC PANEL
ALT: 9 U/L (ref 0–44)
AST: 12 U/L — ABNORMAL LOW (ref 15–41)
Albumin: 2.8 g/dL — ABNORMAL LOW (ref 3.5–5.0)
Alkaline Phosphatase: 50 U/L (ref 38–126)
Anion gap: 10 (ref 5–15)
BUN: 37 mg/dL — ABNORMAL HIGH (ref 8–23)
CO2: 10 mmol/L — ABNORMAL LOW (ref 22–32)
Calcium: 7.2 mg/dL — ABNORMAL LOW (ref 8.9–10.3)
Chloride: 121 mmol/L — ABNORMAL HIGH (ref 98–111)
Creatinine, Ser: 3.84 mg/dL — ABNORMAL HIGH (ref 0.44–1.00)
GFR calc Af Amer: 13 mL/min — ABNORMAL LOW (ref >60)
GFR calc non Af Amer: 11 mL/min — ABNORMAL LOW (ref >60)
Glucose, Bld: 81 mg/dL (ref 70–99)
Potassium: 4.2 mmol/L (ref 3.5–5.1)
Sodium: 141 mmol/L (ref 135–145)
Total Bilirubin: 0.2 mg/dL — ABNORMAL LOW (ref 0.3–1.2)
Total Protein: 5.7 g/dL — ABNORMAL LOW (ref 6.5–8.1)

## 2019-10-04 LAB — CBC
HCT: 26.6 % — ABNORMAL LOW (ref 36.0–46.0)
Hemoglobin: 8.7 g/dL — ABNORMAL LOW (ref 12.0–15.0)
MCH: 32.5 pg (ref 26.0–34.0)
MCHC: 32.7 g/dL (ref 30.0–36.0)
MCV: 99.3 fL (ref 80.0–100.0)
Platelets: 255 10*3/uL (ref 150–400)
RBC: 2.68 MIL/uL — ABNORMAL LOW (ref 3.87–5.11)
RDW: 17.4 % — ABNORMAL HIGH (ref 11.5–15.5)
WBC: 8.8 10*3/uL (ref 4.0–10.5)
nRBC: 0.2 % (ref 0.0–0.2)

## 2019-10-04 LAB — PHOSPHORUS: Phosphorus: 5.5 mg/dL — ABNORMAL HIGH (ref 2.5–4.6)

## 2019-10-04 LAB — HIV ANTIBODY (ROUTINE TESTING W REFLEX): HIV Screen 4th Generation wRfx: NONREACTIVE

## 2019-10-04 NOTE — Progress Notes (Signed)
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 At baseline with secondary hyperparathyroidism 3-week history as dictated above with diarrhea Severe AKI on admission  Patient does see Dr. Danie Chandler of [facility] for routine nephrology follow-up and in fact came to the emergency room at both her and Dr. Philbert Riser recommendation on 7/28   Assessment & Plan:   Principal Problem:   Acute kidney injury superimposed on chronic kidney disease ([place]) Active Problems:   Hypothyroidism   HYPERTENSION, BENIGN   ATRIAL FLUTTER   Diarrhea   Dehydration   Acute-on-chronic kidney injury ([place])   1. Viral diarrhea?side ffx prolia? a. Cdiff neg, await path panel b. Dr. Johnney Ou already aware side effx Prlia c. resolving 2. SLE since 1990 a. OP management by rheum--would get CCP etc at next visit 3. 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 Johnney Ou on transplant list [facility] a. Better b. Cont fluids IV c. Likely can d/c am home 4. Secondary hyperparathyroidism a. Hold binders for now-Phos not too bad b. OP phos on f/u 5. Atrial flutter status post ablation 2010 a. Controlled on Propafenone 150 b. OP EP follow up 6. ITP splenectomy in 1972 7. Hypothyroidism 8. Anemia of renal disease a. Some element hemodulition b. Get  OP Iron studies  DVT prophylaxis:  Code Status:  Family Communication: Disposition:   Status is: Inpatient  Remains inpatient appropriate because:Persistent severe electrolyte disturbances   Dispo: The patient is from: Home              Anticipated d/c is to: Home              Anticipated d/c date is: 1 day              Patient currently is not medically stable to d/c.       Consultants:   None yet  Procedures: None  Antimicrobials: No BUN/creatinine currently down to 37/3.8   Subjective: Improved passing good uinre no cp 1 loose stool Eating some better  Objective: Vitals:   10/03/19 1843 10/03/19 2243 10/04/19 0531 10/04/19 1010  BP: (!) 130/78 (!) 136/78 (!) 117/56 (!) 120/59  Pulse: 87 80 90 87  Resp: 16 18 18 20   Temp: 98.3 F (36.8 C) 98.3 F (36.8 C) 98.9 F (37.2 C) 98.4 F (36.9 C)  TempSrc: Oral Oral Oral Oral  SpO2: 100% 98% 97% 97%  Weight: 53.2 kg     Height: 5\' 3"  (1.6 m)       Intake/Output Summary (Last 24 hours) at 10/04/2019 1351 Last data filed at 10/04/2019 1316 Gross per 24 hour  Intake 3155.99 ml  Output 0 ml  Net 3155.99 ml   Filed Weights   10/02/19 1601 10/03/19 1843  Weight: 51.7 kg 53.2 kg    Examination:  General exam: pleasant no ict pallor Respiratory system: cta b Cardiovascular system: s1 s2 no m  Gastrointestinal system: soft nt nd. Central nervous system: ROM and power intact Extremities: nad Skin: no rash Psychiatry:  pleasant  Data Reviewed: I have personally reviewed following labs and imaging studies  BUN/creatinine down from admission 4.69 range to 3.8  GFR improved  Hemoglobin down from 10.3-8.7  Radiology Studies: CT ABDOMEN PELVIS WO CONTRAST  Result Date: 10/03/2019 CLINICAL DATA:  Nausea vomiting EXAM: CT ABDOMEN AND PELVIS WITHOUT CONTRAST TECHNIQUE: Multidetector CT imaging of the abdomen and pelvis was performed following the standard protocol without IV contrast. COMPARISON:  None.  FINDINGS: Lower chest: The visualized heart size within normal limits. No pericardial fluid/thickening. No hiatal hernia. The visualized portions of the lungs are clear. Hepatobiliary: Although limited due to the lack of intravenous contrast, normal in appearance without gross focal abnormality. The patient is status post cholecystectomy. No biliary ductal dilation. Pancreas:  Unremarkable.  No surrounding inflammatory changes. Spleen: The patient is status post splenectomy. Adrenals/Urinary Tract: Both adrenal glands appear normal. The kidneys and collecting system appear normal without evidence of urinary tract calculus or hydronephrosis. Bladder is unremarkable. Stomach/Bowel: There is a mildly distended stomach filled with air and food stuff. The remainder of the small bowel is unremarkable. There is a mildly dilated sigmoid colon adjacent to the anastomosis with air and stool. Mildly distended sigmoid colon seen down to the level of the rectum. Vascular/Lymphatic: There are no enlarged abdominal or pelvic lymph nodes. Scattered aortic atherosclerotic calcifications are seen without aneurysmal dilatation. Reproductive: The patient is status post hysterectomy. No adnexal masses or collections seen. Other: No evidence of abdominal wall mass or hernia. Musculoskeletal: No acute or significant osseous findings. IMPRESSION: Mildly dilated sigmoid colon adjacent to the anastomosis which could be due to mild ileus or partial bowel obstruction. Aortic Atherosclerosis (ICD10-I70.0). Electronically Signed   By: Prudencio Pair M.D.   On: 10/03/2019 03:04   DG Chest Port 1 View  Result Date: 10/03/2019 CLINICAL DATA:  Chest pain EXAM: PORTABLE CHEST 1 VIEW COMPARISON:  February 19, 2009. FINDINGS: Lungs are clear. The heart size and pulmonary vascularity are normal. No adenopathy. There is aortic atherosclerosis. No pneumothorax. No bone lesions. Surgical clips noted in right upper quadrant of the abdomen. IMPRESSION:  Lungs clear.  Cardiac silhouette normal. Aortic Atherosclerosis (ICD10-I70.0). Electronically Signed   By: Lowella Grip III M.D.   On: 10/03/2019 06:17     Scheduled Meds: . propafenone  150 mg Oral Daily   Continuous Infusions: . sodium chloride 75 mL/hr at 10/04/19 0553     LOS: 1 day    Time spent: 25  Nita Sells, MD [facility] To contact the attending provider between 7A-7P or the covering provider during after hours 7P-7A, please log into the web site [email or web address] and access using universal [place] password for that web site. If you do not have the password, please call the hospital operator.  10/04/2019, 1:51 PM

## 2019-10-05 LAB — RENAL FUNCTION PANEL
Albumin: 2.8 g/dL — ABNORMAL LOW (ref 3.5–5.0)
Anion gap: 8 (ref 5–15)
BUN: 30 mg/dL — ABNORMAL HIGH (ref 8–23)
CO2: 13 mmol/L — ABNORMAL LOW (ref 22–32)
Calcium: 6.8 mg/dL — ABNORMAL LOW (ref 8.9–10.3)
Chloride: 121 mmol/L — ABNORMAL HIGH (ref 98–111)
Creatinine, Ser: 3.73 mg/dL — ABNORMAL HIGH (ref 0.44–1.00)
GFR calc Af Amer: 14 mL/min — ABNORMAL LOW (ref >60)
GFR calc non Af Amer: 12 mL/min — ABNORMAL LOW (ref >60)
Glucose, Bld: 84 mg/dL (ref 70–99)
Phosphorus: 3.9 mg/dL (ref 2.5–4.6)
Potassium: 3.7 mmol/L (ref 3.5–5.1)
Sodium: 142 mmol/L (ref 135–145)

## 2019-10-05 MED ORDER — FAMOTIDINE 20 MG PO TABS
10.0000 mg | ORAL_TABLET | Freq: Every day | ORAL | Status: DC
  Administered 2019-10-05 – 2019-10-06 (×2): 10 mg via ORAL
  Filled 2019-10-05 (×2): qty 1

## 2019-10-05 MED ORDER — SODIUM BICARBONATE 650 MG PO TABS
650.0000 mg | ORAL_TABLET | Freq: Two times a day (BID) | ORAL | Status: DC
  Administered 2019-10-05 – 2019-10-06 (×3): 650 mg via ORAL
  Filled 2019-10-05 (×3): qty 1

## 2019-10-05 NOTE — Progress Notes (Signed)
PHARMACY NOTE:  RENAL DOSAGE ADJUSTMENT  Current famotidine order includes a mismatch between dosage and estimated renal function.  As per policy approved by the Pharmacy & Therapeutics and Medical Executive Committees, the famotidine dosage will be adjusted accordingly.  Ordered dosage:  20mg  daily  Renal Function:  Estimated Creatinine Clearance: 11.9 mL/min (A) (by C-G formula based on SCr of 3.73 mg/dL (H)). []      On intermittent HD, scheduled: []      On CRRT  Patient currently not receiving HD or CRRT but has CKD and presented with AKI.  Famotidine dosage has been changed to: 10mg  daily   Thank you for allowing pharmacy to be a part of this patient's care.  Dimple Nanas, PharmD PGY-1 Acute Care Pharmacy Resident 10/05/2019 12:06 PM

## 2019-10-05 NOTE — Plan of Care (Signed)
  Problem: Clinical Measurements: Goal: Ability to maintain clinical measurements within normal limits will improve Outcome: Progressing Goal: Will remain free from infection Outcome: Progressing   Problem: Elimination: Goal: Will not experience complications related to urinary retention Outcome: Progressing   Problem: Pain Managment: Goal: General experience of comfort will improve Outcome: Progressing   Problem: Safety: Goal: Ability to remain free from injury will improve Outcome: Progressing   Problem: Skin Integrity: Goal: Risk for impaired skin integrity will decrease Outcome: Progressing

## 2019-10-05 NOTE — Progress Notes (Signed)
PROGRESS NOTE    Debra Petersen  GUR:[id number] DOB: 04-20-1950 DOA: 10/02/2019 PCP: Prince Solian, MD  Brief Narrative:  69 year old lady admitted last 02/2009 with wide-complex tachycardia had flutter ablation at the time was on Rythmol/Inderal and was sent home on aspirin as flutter ablation wasSuccessful Prior bowel perforation temporary ileostomy Previously on Cytoxan and steroids ITP and splenectomy 1972 Prior transverse myelitis History of lupus diagnosed 1990 history of CKD stage 4 with last GFR 14 followed at [facility] and apparently is on transplant list Per care everywhere apparently had a CT scan 09/16/2019 showing hypodense hyperdense lesions with cysts in the kidneys? At baseline with secondary hyperparathyroidism 3-week history as dictated above with diarrhea Severe AKI on admission  Patient does see Dr. Danie Chandler of [facility] for routine nephrology follow-up and in fact came to the emergency room at both her and Dr. Philbert Riser recommendation on 7/28   Assessment & Plan:   Principal Problem:   Acute kidney injury superimposed on chronic kidney disease ([place]) Active Problems:   Hypothyroidism   HYPERTENSION, BENIGN   ATRIAL FLUTTER   Diarrhea   Dehydration   Acute-on-chronic kidney injury ([place])   1. Viral diarrhea?side ffx prolia? a. Cdiff neg, pathogen panel completely negative so no further work-up or isolation needed b. Dr. Johnney Ou already aware side effx Prlia 2. Nausea a. Symptoms sound like reflux therefore starting Pepcid daily 3. SLE since 1990 a. OP management by rheum--would get CCP etc at next visit 4. 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 Johnney Ou on transplant list at [facility] a. Discussed with Dr. Johnney Ou on telephone b. Have started bicarb today we will continue IV fluids as she is still not closer to her normal c. If she comes down or plateaus she can probably be discharged home given passing urine and worked  up as an outpatient if further needs 5. Secondary hyperparathyroidism a. Hold binders for now-Phos not too bad b. OP phos on f/u 6. Atrial flutter status post ablation 2010 a. Controlled on Propafenone 150 b. OP EP follow up 7. ITP splenectomy in 1972 8. Hypothyroidism 9. Anemia of renal disease a. Some element hemodulition b. Get OP Iron studies  DVT prophylaxis:  Code Status:  Family Communication: Disposition:   Status is: Inpatient  Remains inpatient appropriate because:Persistent severe electrolyte disturbances   Dispo: The patient is from: Home              Anticipated d/c is to: Home              Anticipated d/c date is: 1 day              Patient currently is not medically stable to d/c.       Consultants:   None yet  Procedures: None  Antimicrobials: No BUN/creatinine currently down to 37/3.8-->30/3.7 Bicarb 13   Subjective: Episodic vomiting today and not feeling very hungry No diarrhea however  Objective: Vitals:   10/04/19 2002 10/04/19 2004 10/05/19 0443 10/05/19 1004  BP: (!) 130/64 (!) 130/64 128/67 125/74  Pulse: 92 90 85 86  Resp: 16 16 16 18   Temp: 99.2 F (37.3 C) 99.2 F (37.3 C)  98 F (36.7 C)  TempSrc: Oral Oral  Oral  SpO2: 97% 98% 97% 98%  Weight:  55 kg    Height:        Intake/Output Summary (Last 24 hours) at 10/05/2019 1348 Last data filed at 10/05/2019 1337 Gross per 24 hour  Intake 1626.47 ml  Output 2375 ml  Net -748.53 ml   Filed Weights   10/02/19 1601 10/03/19 1843 10/04/19 2004  Weight: 51.7 kg 53.2 kg 55 kg    Examination:  General exam: pleasant no ict pallor Respiratory system: cta b Cardiovascular system: s1 s2 no m Gastrointestinal system: soft nt nd. Central nervous system: ROM and power intact Extremities: nad Skin: no rash Psychiatry:  pleasant  Data Reviewed: I have personally reviewed following labs and imaging studies   Radiology Studies: No results found.   Scheduled Meds: .  famotidine  10 mg Oral Daily  . propafenone  150 mg Oral Daily  . sodium bicarbonate  650 mg Oral BID   Continuous Infusions: . sodium chloride 75 mL/hr at 10/05/19 0626     LOS: 2 days    Time spent: 25  Nita Sells, MD [facility] To contact the attending provider between 7A-7P or the covering provider during after hours 7P-7A, please log into the web site [email or web address] and access using universal [place] password for that web site. If you do not have the password, please call the hospital operator.  10/05/2019, 1:48 PM

## 2019-10-06 LAB — CBC WITH DIFFERENTIAL/PLATELET
Abs Immature Granulocytes: 0.03 10*3/uL (ref 0.00–0.07)
Basophils Absolute: 0.1 10*3/uL (ref 0.0–0.1)
Basophils Relative: 1 %
Eosinophils Absolute: 0.6 10*3/uL — ABNORMAL HIGH (ref 0.0–0.5)
Eosinophils Relative: 6 %
HCT: 26.7 % — ABNORMAL LOW (ref 36.0–46.0)
Hemoglobin: 8.9 g/dL — ABNORMAL LOW (ref 12.0–15.0)
Immature Granulocytes: 0 %
Lymphocytes Relative: 39 %
Lymphs Abs: 3.9 10*3/uL (ref 0.7–4.0)
MCH: 33.1 pg (ref 26.0–34.0)
MCHC: 33.3 g/dL (ref 30.0–36.0)
MCV: 99.3 fL (ref 80.0–100.0)
Monocytes Absolute: 0.8 10*3/uL (ref 0.1–1.0)
Monocytes Relative: 8 %
Neutro Abs: 4.6 10*3/uL (ref 1.7–7.7)
Neutrophils Relative %: 46 %
Platelets: 285 10*3/uL (ref 150–400)
RBC: 2.69 MIL/uL — ABNORMAL LOW (ref 3.87–5.11)
RDW: 17.9 % — ABNORMAL HIGH (ref 11.5–15.5)
WBC: 10.1 10*3/uL (ref 4.0–10.5)
nRBC: 0.3 % — ABNORMAL HIGH (ref 0.0–0.2)

## 2019-10-06 LAB — RENAL FUNCTION PANEL
Albumin: 2.9 g/dL — ABNORMAL LOW (ref 3.5–5.0)
Anion gap: 7 (ref 5–15)
BUN: 27 mg/dL — ABNORMAL HIGH (ref 8–23)
CO2: 14 mmol/L — ABNORMAL LOW (ref 22–32)
Calcium: 6.7 mg/dL — ABNORMAL LOW (ref 8.9–10.3)
Chloride: 119 mmol/L — ABNORMAL HIGH (ref 98–111)
Creatinine, Ser: 3.38 mg/dL — ABNORMAL HIGH (ref 0.44–1.00)
GFR calc Af Amer: 15 mL/min — ABNORMAL LOW (ref >60)
GFR calc non Af Amer: 13 mL/min — ABNORMAL LOW (ref >60)
Glucose, Bld: 91 mg/dL (ref 70–99)
Phosphorus: 3.2 mg/dL (ref 2.5–4.6)
Potassium: 3.7 mmol/L (ref 3.5–5.1)
Sodium: 140 mmol/L (ref 135–145)

## 2019-10-06 MED ORDER — TRAMADOL HCL 50 MG PO TABS: 50.0000 mg | ORAL_TABLET | Freq: Two times a day (BID) | ORAL | 0 refills | Status: AC | PRN

## 2019-10-06 MED ORDER — FAMOTIDINE 10 MG PO TABS: 10.0000 mg | ORAL_TABLET | Freq: Every day | ORAL | 0 refills | Status: DC

## 2019-10-06 NOTE — Discharge Summary (Signed)
Physician Discharge Summary  Debra Petersen IAX:[id number] DOB: September 17, 1950 DOA: 10/02/2019  PCP: Prince Solian, MD  Admit date: 10/02/2019 Discharge date: 10/06/2019  Time spent: 40 minutes  Recommendations for Outpatient Follow-up:  1. Patient given referral for outpatient therapy for mechanical low back pain and given limited prescription of tramadol on discharge-please follow-up with the same 2. Needs renal panel in about 5 to 7 days in the outpatient setting and outpatient follow-up with Dr. Danie Chandler who is CCed on this note in addition to her transplant physicians at [facility] 3. Will also give a prescription for Pepcid-May need to escalate to short-term PPI and consider outpatient referral to GI if no better-had no alarm symptoms in the hospital 4. Can consider CCP, ESR and other labs for her SLE in the outpatient setting 5. Consider outpatient iron studies 6. Consider TSH 3 weeks  Discharge Diagnoses:  Principal Problem:   Acute kidney injury superimposed on chronic kidney disease ([place]) Active Problems:   Hypothyroidism   HYPERTENSION, BENIGN   ATRIAL FLUTTER   Diarrhea   Dehydration   Acute-on-chronic kidney injury [facility])   Discharge Condition: Improved  Diet recommendation: Renal  Filed Weights   10/04/19 2004 10/05/19 2121 10/06/19 0500  Weight: 55 kg 52.1 kg 52.1 kg    History of present illness:  69 year old lady admitted last 02/2009 with wide-complex tachycardia had flutter ablation at the time was on Rythmol/Inderal and was sent home on aspirin as flutter ablation wasSuccessful Prior bowel perforation temporary ileostomy Previously on Cytoxan and steroids ITP and splenectomy 1972 Prior transverse myelitis History of lupus diagnosed 1990 history of CKD stage 4 with last GFR 14 followed at [facility] and apparently is on transplant list Per care everywhere apparently had a CT scan 09/16/2019 showing hypodense hyperdense lesions with cysts in the  kidneys? At baseline with secondary hyperparathyroidism 3-week history as dictated above with diarrhea Severe AKI on admission  Patient does see Dr. Danie Chandler of [facility] for routine nephrology follow-up and in fact came to the emergency room at both her and Dr. Philbert Riser recommendation on 7/28  Hospital Course:  1. Viral diarrhea?side ffx prolia? a. Cdiff neg, pathogen panel completely negative so no further work-up or isolation needed b. Dr. Johnney Ou already aware side effx Prlia 2. Mechanical low back pain 1. Much improved in the hospital with tramadol dosing-I have given her a limited prescription of 5-day supply and she should follow-up in the outpatient setting with outpatient therapy as she is ambulatory--may just need some mobility exercises 2. Would consider imaging after another 2 weeks if she continues to have pain 3. Nausea a. Symptoms sound like reflux therefore starting Pepcid daily b. This is resolved I have given her a prescription of this 4. SLE since 1990 a. OP management by rheum--would get CCP etc at next visit 5. 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 Johnney [facility]u [facility] a. Discussed with Dr. Johnney Ou on telephone b. Have started bicarb this admission c. Plateaued at 3.3 6. Secondary hyperparathyroidism a. Hold binders for now-Phos not too bad b. OP phos on f/u 7. Atrial flutter status post ablation 2010 a. Controlled on Propafenone 150 b. OP EP follow up 8. ITP splenectomy in 1972 9. Hypothyroidism needs outpatient TSH in about 10. Anemia of renal disease a. Some element hemodulition b. Get OP Iron studies  Procedures:  Multiple awake coherent pleasant noticed dress  Consultations:  None  Discharge Exam: Vitals:  10/05/19 2121 10/06/19 0420  BP: (!) 147/70 (!) 125/63  Pulse: 86 88  Resp: 20 18  Temp: 98.4 F (36.9 C) 98.7 F (37.1 C)  SpO2: 98% 96%    General: Awake coherent no distress EOMI NCAT  no focal deficit pleasant Cardiovascular: S1-S2 no murmur not on telemetry Respiratory: Clear no added sound Abdomen soft no rebound no guarding  Discharge Instructions   Discharge Instructions    Diet - low sodium heart healthy   Complete by: As directed    Discharge instructions   Complete by: As directed    Follow with Dr. Radene Gunning and Johnney Ou You will need labs ~ 1 week--I will CC your docs to ensure we watch ur kidneys Please go to Therapy as an OP{ If ur LBP doesn't improve we will need to consider imaging but it sounds very mechanical   Increase activity slowly   Complete by: As directed      Allergies as of 10/06/2019      Reactions   Codeine Itching      Medication List    STOP taking these medications   propranolol 10 MG tablet Commonly known as: INDERAL     TAKE these medications   calcitRIOL 0.25 MCG capsule Commonly known as: ROCALTROL Take 0.25 mcg by mouth daily.   D3 ADULT PO Take 2 tablets by mouth daily.   famotidine 10 MG tablet Commonly known as: PEPCID Take 1 tablet (10 mg total) by mouth daily.   pravastatin 20 MG tablet Commonly known as: PRAVACHOL Take 20 mg by mouth daily.   propafenone 150 MG tablet Commonly known as: RYTHMOL TAKE 1 TABLET BY MOUTH TWICE DAILY. What changed: when to take this   sodium bicarbonate 650 MG tablet Take 650 mg by mouth 2 (two) times daily.   traMADol 50 MG tablet Commonly known as: ULTRAM Take 1 tablet (50 mg total) by mouth every 12 (twelve) hours as needed for up to 5 days (mild pain).      Allergies  Allergen Reactions  . Codeine Itching      The results of significant diagnostics from this hospitalization (including imaging, microbiology, ancillary and laboratory) are listed below for reference.    Significant Diagnostic Studies: CT ABDOMEN PELVIS WO CONTRAST  Result Date: 10/03/2019 CLINICAL DATA:  Nausea vomiting EXAM: CT ABDOMEN AND PELVIS WITHOUT CONTRAST TECHNIQUE: Multidetector CT  imaging of the abdomen and pelvis was performed following the standard protocol without IV contrast. COMPARISON:  None. FINDINGS: Lower chest: The visualized heart size within normal limits. No pericardial fluid/thickening. No hiatal hernia. The visualized portions of the lungs are clear. Hepatobiliary: Although limited due to the lack of intravenous contrast, normal in appearance without gross focal abnormality. The patient is status post cholecystectomy. No biliary ductal dilation. Pancreas:  Unremarkable.  No surrounding inflammatory changes. Spleen: The patient is status post splenectomy. Adrenals/Urinary Tract: Both adrenal glands appear normal. The kidneys and collecting system appear normal without evidence of urinary tract calculus or hydronephrosis. Bladder is unremarkable. Stomach/Bowel: There is a mildly distended stomach filled with air and food stuff. The remainder of the small bowel is unremarkable. There is a mildly dilated sigmoid colon adjacent to the anastomosis with air and stool. Mildly distended sigmoid colon seen down to the level of the rectum. Vascular/Lymphatic: There are no enlarged abdominal or pelvic lymph nodes. Scattered aortic atherosclerotic calcifications are seen without aneurysmal dilatation. Reproductive: The patient is status post hysterectomy. No adnexal masses or collections seen. Other: No evidence  of abdominal wall mass or hernia. Musculoskeletal: No acute or significant osseous findings. IMPRESSION: Mildly dilated sigmoid colon adjacent to the anastomosis which could be due to mild ileus or partial bowel obstruction. Aortic Atherosclerosis (ICD10-I70.0). Electronically Signed   By: Prudencio Pair M.D.   On: 10/03/2019 03:04   DG Chest Port 1 View  Result Date: 10/03/2019 CLINICAL DATA:  Chest pain EXAM: PORTABLE CHEST 1 VIEW COMPARISON:  February 19, 2009. FINDINGS: Lungs are clear. The heart size and pulmonary vascularity are normal. No adenopathy. There is aortic  atherosclerosis. No pneumothorax. No bone lesions. Surgical clips noted in right upper quadrant of the abdomen. IMPRESSION: Lungs clear.  Cardiac silhouette normal. Aortic Atherosclerosis (ICD10-I70.0). Electronically Signed   By: Lowella Grip III M.D.   On: 10/03/2019 06:17    Microbiology: Recent Results (from the past 240 hour(s))  Gastrointestinal Panel by PCR , Stool     Status: None   Collection Time: 10/03/19  1:49 AM   Specimen: Stool  Result Value Ref Range Status   Campylobacter species NOT DETECTED NOT DETECTED Final   Plesimonas shigelloides NOT DETECTED NOT DETECTED Final   Salmonella species NOT DETECTED NOT DETECTED Final   Yersinia enterocolitica NOT DETECTED NOT DETECTED Final   Vibrio species NOT DETECTED NOT DETECTED Final   Vibrio cholerae NOT DETECTED NOT DETECTED Final   Enteroaggregative E coli (EAEC) NOT DETECTED NOT DETECTED Final   Enteropathogenic E coli (EPEC) NOT DETECTED NOT DETECTED Final   Enterotoxigenic E coli (ETEC) NOT DETECTED NOT DETECTED Final   Shiga like toxin producing E coli (STEC) NOT DETECTED NOT DETECTED Final   Shigella/Enteroinvasive E coli (EIEC) NOT DETECTED NOT DETECTED Final   Cryptosporidium NOT DETECTED NOT DETECTED Final   Cyclospora cayetanensis NOT DETECTED NOT DETECTED Final   Entamoeba histolytica NOT DETECTED NOT DETECTED Final   Giardia lamblia NOT DETECTED NOT DETECTED Final   Adenovirus F40/41 NOT DETECTED NOT DETECTED Final   Astrovirus NOT DETECTED NOT DETECTED Final   Norovirus GI/GII NOT DETECTED NOT DETECTED Final   Rotavirus A NOT DETECTED NOT DETECTED Final   Sapovirus (I, II, IV, and V) NOT DETECTED NOT DETECTED Final    Comment: Performed at [facility], [address] [place], [place] [address]  C Difficile Quick Screen w PCR reflex     Status: None   Collection Time: 10/03/19  1:50 AM   Specimen: Stool  Result Value Ref Range Status   C Diff antigen NEGATIVE NEGATIVE Final   C Diff toxin  NEGATIVE NEGATIVE Final   C Diff interpretation No C. difficile detected.  Final    Comment: Performed at [facility], [place] [address], [place], [place] [address]  SARS Coronavirus 2 by RT PCR (hospital order, performed in [facility] hospital lab) Nasopharyngeal Nasopharyngeal Swab     Status: None   Collection Time: 10/03/19  5:24 AM   Specimen: Nasopharyngeal Swab  Result Value Ref Range Status   SARS Coronavirus 2 NEGATIVE NEGATIVE Final    Comment: (NOTE) SARS-CoV-2 target nucleic acids are NOT DETECTED.  The SARS-CoV-2 RNA is generally detectable in upper and lower respiratory specimens during the acute phase of infection. The lowest concentration of SARS-CoV-2 viral copies this assay can detect is 250 copies / mL. A negative result does not preclude SARS-CoV-2 infection and should not be used as the sole basis for treatment or other patient management decisions.  A negative result may occur with improper specimen collection / handling, submission  of specimen other than nasopharyngeal swab, presence of viral mutation(s) within the areas targeted by this assay, and inadequate number of viral copies (<250 copies / mL). A negative result must be combined with clinical observations, patient history, and epidemiological information.  Fact Sheet for Patients:   StrictlyIdeas.no  Fact Sheet for Healthcare Providers: BankingDealers.co.za  This test is not yet approved or  cleared by the [place] FDA and has been authorized for detection and/or diagnosis of SARS-CoV-2 by FDA under an Emergency Use Authorization (EUA).  This EUA will remain in effect (meaning this test can be used) for the duration of the COVID-19 declaration under Section 564(b)(1) of the Act, 21 U.S.C. section 360bbb-3(b)(1), unless the authorization is terminated or revoked sooner.  Performed at [facility], [place] [address], [place],  [place] [address]      Labs: Basic Metabolic Panel: Recent Labs  Lab 10/02/19 1621 10/04/19 0537 10/05/19 0811 10/06/19 0447  NA 137 141 142 140  K 3.7 4.2 3.7 3.7  CL 116* 121* 121* 119*  CO2 12* 10* 13* 14*  GLUCOSE 130* 81 84 91  BUN 41* 37* 30* 27*  CREATININE 4.69* 3.84* 3.73* 3.38*  CALCIUM 7.8* 7.2* 6.8* 6.7*  PHOS  --  5.5* 3.9 3.2   Liver Function Tests: Recent Labs  Lab 10/02/19 1621 10/04/19 0537 10/05/19 0811 10/06/19 0447  AST 11* 12*  --   --   ALT 9 9  --   --   ALKPHOS 62 50  --   --   BILITOT 0.6 0.2*  --   --   PROT 6.9 5.7*  --   --   ALBUMIN 3.5 2.8* 2.8* 2.9*   No results for input(s): LIPASE, AMYLASE in the last 168 hours. No results for input(s): AMMONIA in the last 168 hours. CBC: Recent Labs  Lab 10/02/19 1621 10/04/19 0537 10/06/19 0447  WBC 7.9 8.8 10.1  NEUTROABS 3.9  --  4.6  HGB 10.3* 8.7* 8.9*  HCT 31.7* 26.6* 26.7*  MCV 100.0 99.3 99.3  PLT 303 255 285   Cardiac Enzymes: Recent Labs  Lab 10/03/19 0738  CKTOTAL 67   BNP: BNP (last 3 results) No results for input(s): BNP in the last 8760 hours.  ProBNP (last 3 results) No results for input(s): PROBNP in the last 8760 hours.  CBG: No results for input(s): GLUCAP in the last 168 hours.     Signed:  Nita Sells MD   [facility] 10/06/2019, 8:47 AM

## 2019-10-06 NOTE — Progress Notes (Signed)
Debra Petersen to be discharged Home per MD order. Discussed prescriptions and follow up appointments with the patient. Prescriptions given to patient; medication list explained in detail. Patient verbalized understanding.  Skin clean, dry and intact without evidence of skin break down, no evidence of skin tears noted. IV catheter discontinued intact. Site without signs and symptoms of complications. Dressing and pressure applied. Pt denies pain at the site currently. No complaints noted.  Patient free of lines, drains, and wounds.   An After Visit Summary (AVS) was printed and given to the patient. Patient escorted via wheelchair, and discharged home via private auto.  Amaryllis Dyke, RN

## 2019-12-14 ENCOUNTER — Other Ambulatory Visit: Payer: Self-pay | Admitting: Internal Medicine

## 2020-01-29 ENCOUNTER — Other Ambulatory Visit: Payer: Self-pay | Admitting: Internal Medicine

## 2020-01-31 IMAGING — US US RENAL
1 series · 14 of 25 positions shown · non-contrast
Comparison: 5610

CLINICAL DATA: Chronic kidney disease

EXAM:
RENAL / URINARY TRACT ULTRASOUND COMPLETE

[Series 1: us renal · 0.20mm/px · 14 of 41 slices shown]
[im 1/41]
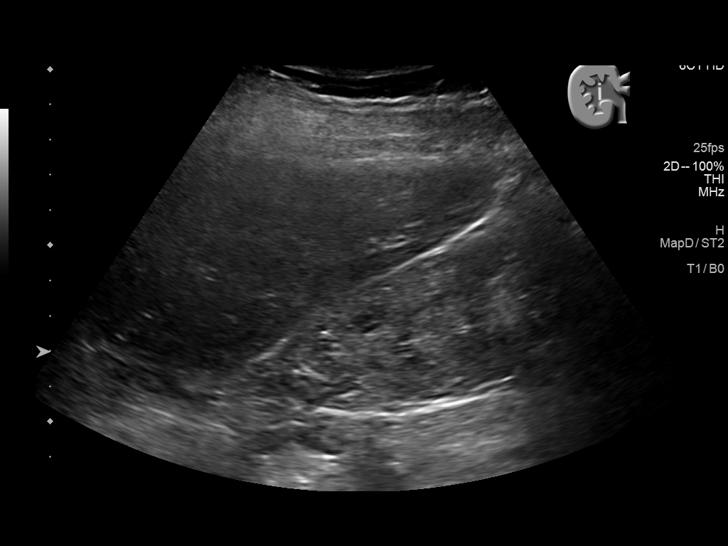
[im 4/41]
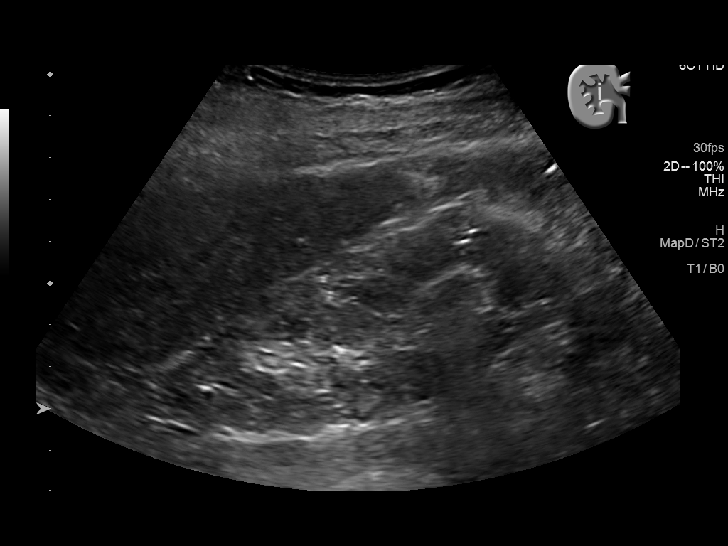
[im 7/41]
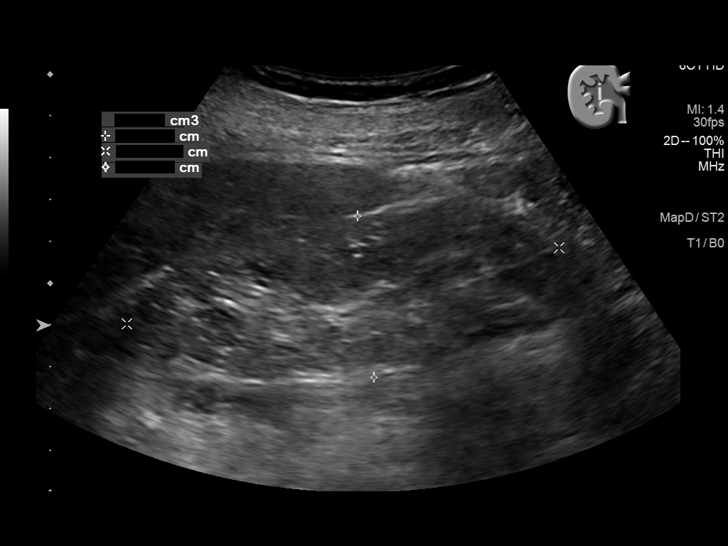
[im 11/41]
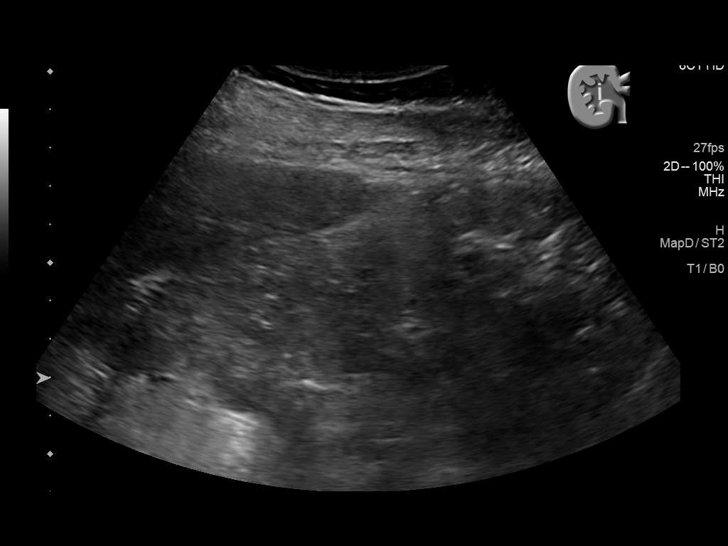
[im 14/41]
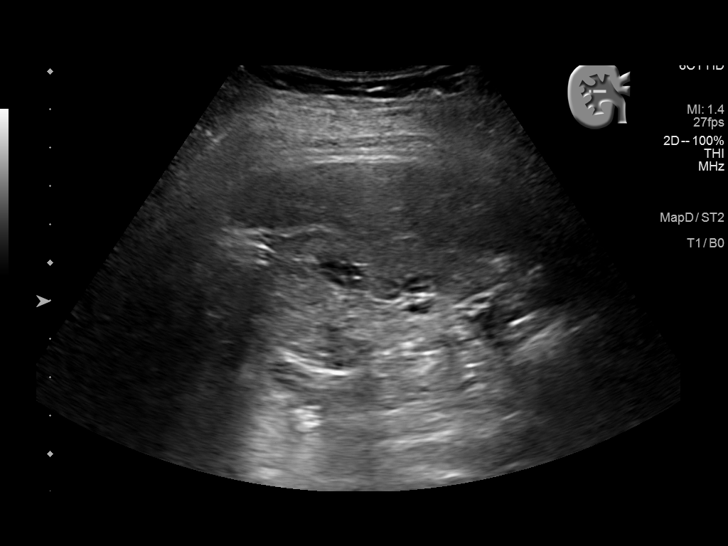
[im 16/41]
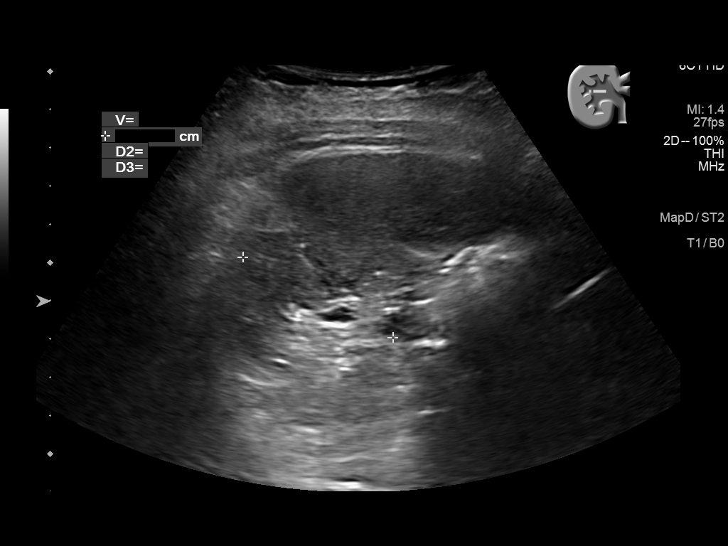
[im 19/41]
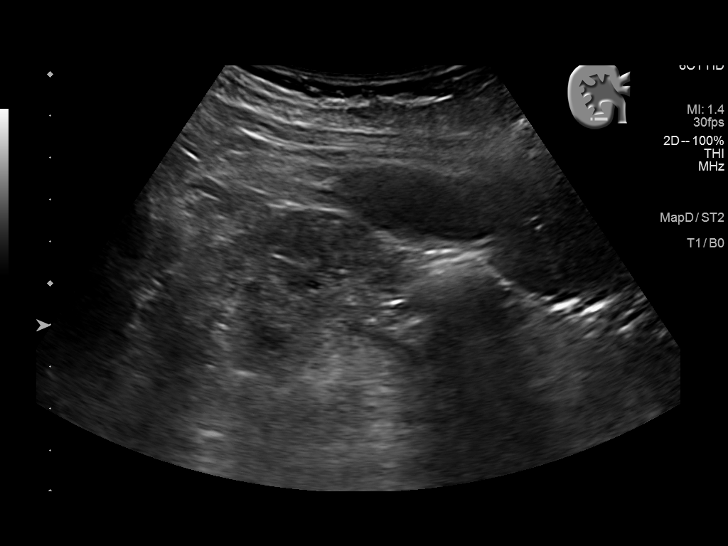
[im 22/41]
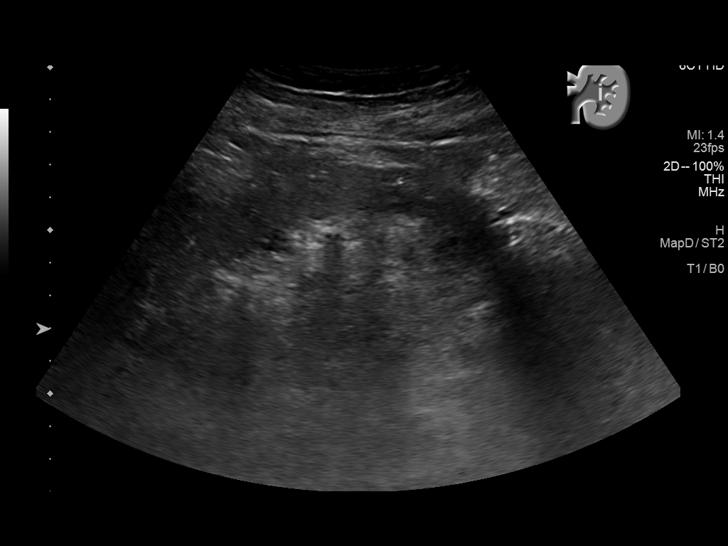
[im 26/41]
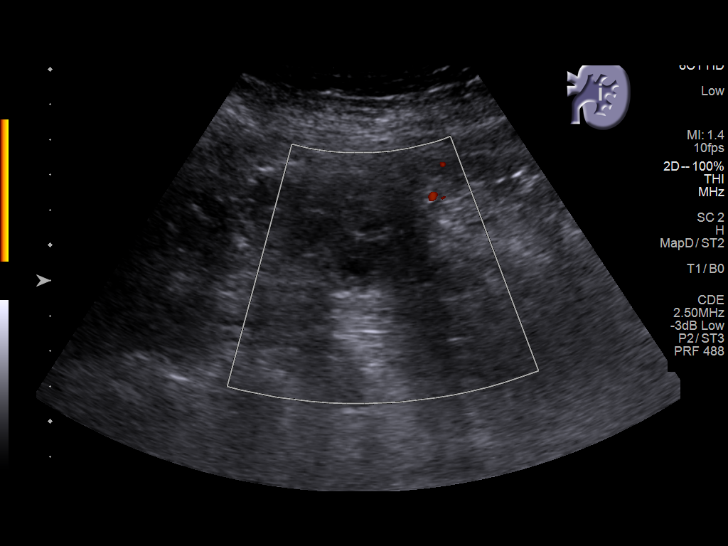
[im 27/41]
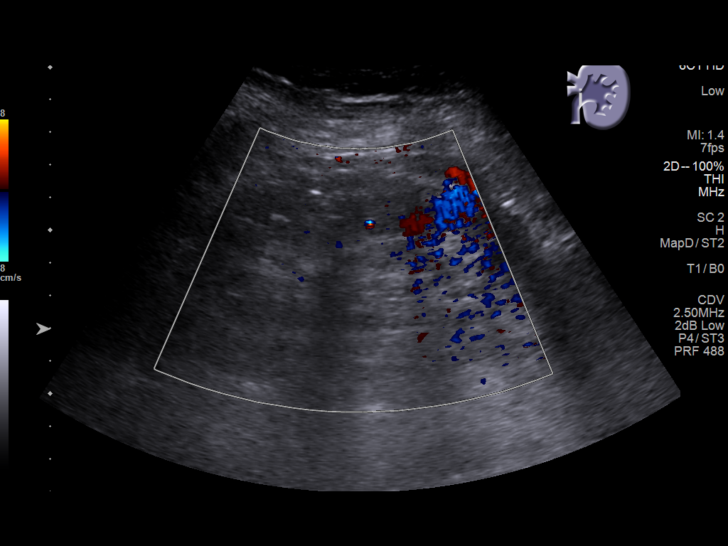
[im 31/41]
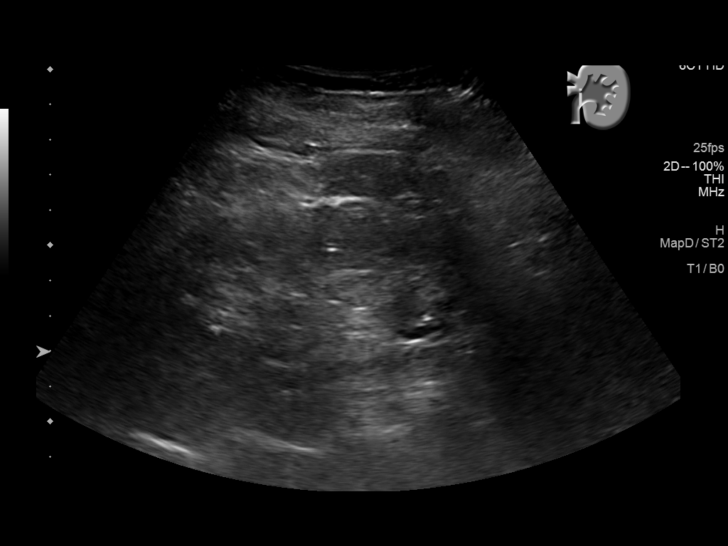
[im 34/41]
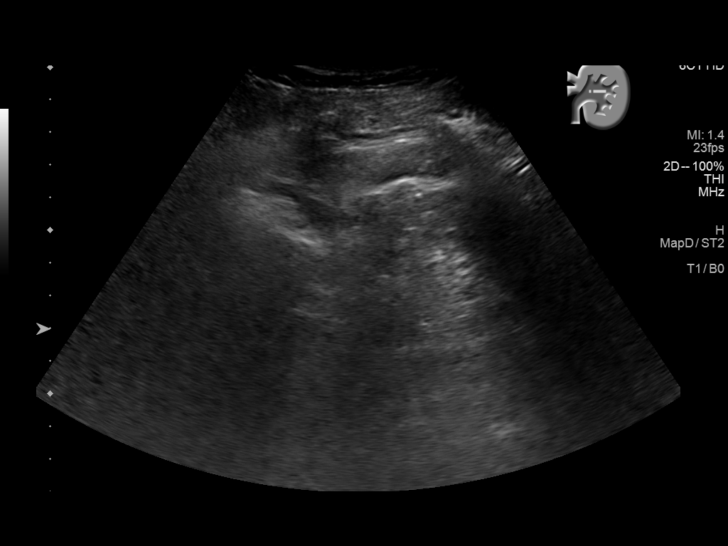
[im 37/41]
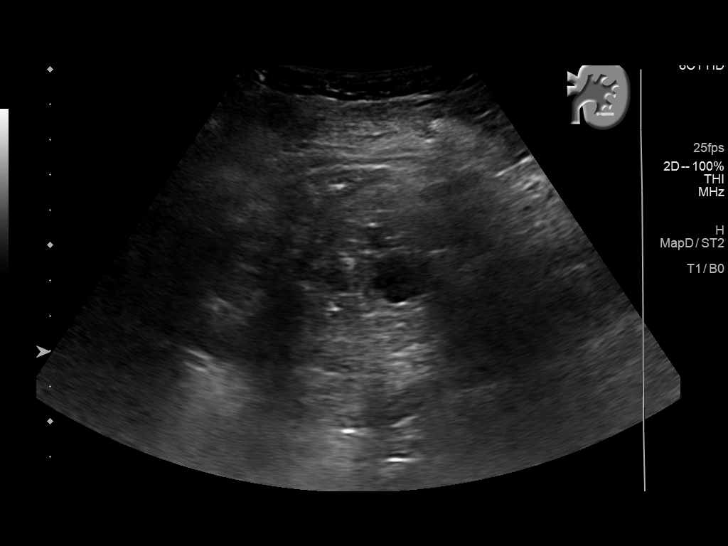
[im 41/41]
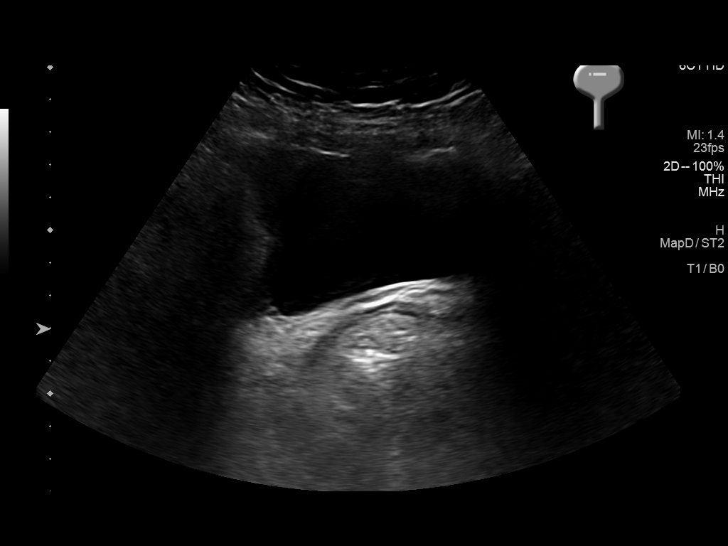

[14 of 25 positions shown; findings below may reference images not displayed]

FINDINGS: Right Kidney:

Renal measurements: 10.5 x 3.9 x 4.5 cm = volume: 94.9 mL. Increased
echogenicity. No mass or hydronephrosis visualized.

Left Kidney:

Renal measurements: 8.2 x 4.2 x 3.6 cm = volume: 65.6 mL. Increased
echogenicity. Small cyst is again noted. No mass or hydronephrosis
visualized.

Bladder:

Appears normal for degree of bladder distention.

Other:

None.
IMPRESSION: No hydronephrosis. Unchanged increased renal echogenicity consistent
with chronic medical renal disease.

## 2020-03-03 NOTE — Progress Notes (Signed)
Cardiology Office Note Date:  03/04/2020  Patient ID:  Debra Petersen, Debra Petersen [birth year], MRN [id number] PCP:  Prince Solian, MD  Cardiologist/Electrophysiologist: Dr. Caryl Comes    Chief Complaint:    syncope  History of Present Illness: Debra Petersen is a 69 y.o. female with history of AFlutter ablated >> SVT/Atach, Lupus (SLE), HTN, CKD (IV) follows at [facility] and [place] [facility] (Dr. Johnney Ou) w/secondary hypoparathyroidism, amd anemia of renal disease.  She comes in today to be seen for Dr. Caryl Comes, last seen by him Jan 2020, she was doing well, no changes were made.  July 2021 hospitalized with GI illness, back pain, diarrhea felt to be viral.  She was dehydrated, had AKIN on CKD, discharged 10/06/19  She had an ER visit 01/18/20 at [facility] after a syncopal event, was standing, record notes associated diaphoresis and woke with nausea.  She though she had mistakenly taken 10mg  of amloipine (usually takes 5mg ) EKGs read as SR, 84 and 89bpm,   hsTroponin I 41 (*)  hsTroponin I 50 (*)  delta hsTroponin I 9 (*)  K+ 4.3 BUN/Creat 33/5.39  (baseline looks in the 3's) WBC 11.0 (*)  HGB 10.3 (*)  HCT 31.3 (*)  Platelet 471 (*)  CT head 1.No acute intracranial abnormality. 2.No depressed calvarial fracture. Small left parietal scalp hematoma  There was mention of a bedside echo done, without abnormal findings, though I can not find a formal report She was discharged from the ER to hold her amlodipine that day and resume normal dosing the following day  TODAY  She is feeling fairly well, at her baseline. She reports 2 syncopal episodes, the event in November and last weekend though she does no think this time she fainted completely. Both occurred with the same symptoms/activities. She was at work at [employer], was very busy, bending to get bags then back up , over and over. Last month upon standing up she fainted and hit her head.  Last weekend the exact same thing, though upon standing she got ver  weak, and kind of stumbled to seated, she could hear everyone, but was very weak, and did not think she fainted.  Both times upon standing she was lightheaded, weak, neither were associate with any palpitations or cardiac awareness.  Both times took a bit to really feel better/stronger.  She is worried about her BP being high. Says she has been going back/forth between amlodipine 5mg  and 10mg . Says historically she has not been able to tolerate the 10mg  with weakness and low BP but worried because sometimes she finds her BP at home 190/100.  She says since the summer after a Prolia injection everything seemed to roll downhill, her renal function acutely worse and has reports her Creat in the 5's is known for her.  She is worried about a CP, is L chest and an aching tenderness. Comes and goes, is tender to palpations She had a stress test a few months back in prep for possible renal transplant need.  No palpitations, no SOB   AAD Hx Rythmol 2011 Notes dates in 2011 she presented with Atrial flutter was identified having manifested as a wide-complex tachycardia. She been taking Rythmol on for sometime and it created a appearance of ventricular tachycardia  02/23/2009: EPS/ablation of a clockwise atrial flutter  Past Medical History:  Diagnosis Date  . CKD (chronic kidney disease)   . Lupus (systemic lupus erythematosus) ([place])   . Wide-complex tachycardia (HCC)    probable atrial flutter with 1-1  and 2-1 conduction    Past Surgical History:  Procedure Laterality Date  . ABDOMINAL HYSTERECTOMY    . CARDIAC ELECTROPHYSIOLOGY STUDY AND ABLATION  02/23/2009   of typical and atypical flutter  . COLON SURGERY    . GALLBLADDER SURGERY    . hystoectomy    . SPLENECTOMY, TOTAL      Current Outpatient Medications  Medication Sig Dispense Refill  . amLODipine (NORVASC) 5 MG tablet Take by mouth daily in the afternoon.    . pravastatin (PRAVACHOL) 20 MG tablet Take 20 mg by mouth daily.     . propafenone (RYTHMOL) 150 MG tablet TAKE 1 TABLET BY MOUTH TWICE DAILY. 60 tablet 0  . sodium bicarbonate 650 MG tablet Take 650 mg by mouth 2 (two) times daily.     No current facility-administered medications for this visit.    Allergies:   Codeine   Social History:  The patient  reports that she has never smoked. She has never used smokeless tobacco. She reports previous alcohol use. She reports that she does not use drugs.   Family History:  The patient's family history includes Coronary artery disease in her father and mother.  ROS:  Please see the history of present illness.    All other systems are reviewed and otherwise negative.   PHYSICAL EXAM:  VS:  BP 110/60   Pulse 79   Ht 5\' 3"  (1.6 m)   Wt 117 lb (53.1 kg)   SpO2 100%   BMI 20.73 kg/m  BMI: Body mass index is 20.73 kg/m. Well nourished, well developed, though thin, in no acute distress HEENT: normocephalic, atraumatic Neck: no JVD, carotid bruits or masses Cardiac:  RRR; no significant murmurs, no rubs, or gallops Lungs:  CTA b/l, no wheezing, rhonchi or rales Abd: soft, nontender MS: no deformity, advanced atrophy for age Ext: no edema Skin: warm and dry, no rash Neuro:  No gross deficits appreciated Psych: euthymic mood, full affect   EKG:  Done today and reviewed by myself shows  SR 79bpm, no ST/T changes   10/28/2019: Lexiscan stress myoview FINDINGS:  Unexpected extra cardiac findings: None.  Defect: No inducible defect to suggest ischemia. No large fixed defect to suggest transmural scar.   TID: <1.25   Gated rest images:  End diastolic volume: 40 cc  End systolic volume: 12 cc  Ejection fraction: >70%.  Wall motion abnormalities: None.  Gated stress images:  End diastolic volume: 27 cc  End systolic volume: 4 cc  Ejection fraction: >70%.  Wall motion abnormalities: None.   10/23/2019: TTE SUMMARY  The left ventricular size is normal.  LV ejection fraction = 60-65%.  Left  ventricular systolic function is normal.  The left ventricular wall motion is normal.  The right ventricle is normal in size and function.  The left and right atrial size are normal.  There is no significant valvular stenosis or regurgitation.  Estimated right ventricular systolic pressure is 31 mmHg.  Estimated right atrial pressure is 5 mmHg.Marland Kitchen  There is no pericardial effusion.  There is no comparison study available.      Recent Labs: 10/04/2019: ALT 9 10/06/2019: BUN 27; Creatinine, Ser 3.38; Hemoglobin 8.9; Platelets 285; Potassium 3.7; Sodium 140  No results found for requested labs within last 8760 hours.   CrCl cannot be calculated (Patient's most recent lab result is older than the maximum 21 days allowed.).   Wt Readings from Last 3 Encounters:  03/04/20 117 lb (53.1 kg)  10/06/19  114 lb 13.8 oz (52.1 kg)  03/20/19 117 lb (53.1 kg)     Other studies reviewed: Additional studies/records reviewed today include: summarized above  ASSESSMENT AND PLAN:  1. AFlutter     Ablated 2010 2. 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 Syncope     Sounds of orthostatic changes  She was mildy orthostatic today with mild dizziness Recommend that she reach out to her nephrologist to discuss her BP management Discussed adequate hydration and symptom recognition Will get 2 week Zio AT   4. CP     Sounds muculoskeletal    Disposition: F/u in 40mo, sooner if needed  Current medicines are reviewed at length with the patient today.  The patient did not have any concerns regarding medicines.  Venetia Night, PA-C 03/04/2020 5:35 PM     [address] [place] [address] [phone] (office)  [phone] (fax)

## 2020-03-04 ENCOUNTER — Ambulatory Visit (INDEPENDENT_AMBULATORY_CARE_PROVIDER_SITE_OTHER): Payer: Medicare Other

## 2020-03-04 ENCOUNTER — Encounter: Payer: Self-pay | Admitting: *Deleted

## 2020-03-04 ENCOUNTER — Ambulatory Visit: Payer: Medicare Other | Admitting: Physician Assistant

## 2020-03-04 ENCOUNTER — Other Ambulatory Visit: Payer: Self-pay

## 2020-03-04 ENCOUNTER — Encounter: Payer: Self-pay | Admitting: Physician Assistant

## 2020-03-04 VITALS — BP 110/60 | HR 79 | Ht 63.0 in | Wt 117.0 lb

## 2020-03-04 DIAGNOSIS — Z79899 Other long term (current) drug therapy: Secondary | ICD-10-CM | POA: Diagnosis not present

## 2020-03-04 DIAGNOSIS — R55 Syncope and collapse: Secondary | ICD-10-CM

## 2020-03-04 DIAGNOSIS — I471 Supraventricular tachycardia: Secondary | ICD-10-CM | POA: Diagnosis not present

## 2020-03-04 NOTE — Patient Instructions (Addendum)
Medication Instructions:   Your physician recommends that you continue on your current medications as directed. Please refer to the Current Medication list given to you today.   *If you need a refill on your cardiac medications before your next appointment, please call your pharmacy*   Lab Work: NONE ORDERED  TODAY   If you have labs (blood work) drawn today and your tests are completely normal, you will receive your results only by: Marland Kitchen MyChart Message (if you have MyChart) OR . A paper copy in the mail If you have any lab test that is abnormal or we need to change your treatment, we will call you to review the results.   Testing/Procedures: Your physician has recommended that you wear an event monitor. Event monitors are medical devices that record the heart's electrical activity. Doctors most often [place] these monitors to diagnose arrhythmias. Arrhythmias are problems with the speed or rhythm of the heartbeat. The monitor is a small, portable device. You can wear one while you do your normal daily activities. This is usually used to diagnose what is causing palpitations/syncope (passing out).    Follow-Up: At [facility], you and your health needs are our priority.  As part of our continuing mission to provide you with exceptional heart care, we have created designated Provider Care Teams.  These Care Teams include your primary Cardiologist (physician) and Advanced Practice Providers (APPs -  Physician Assistants and Nurse Practitioners) who all work together to provide you with the care you need, when you need it.  We recommend signing up for the patient portal called "MyChart".  Sign up information is provided on this After Visit Summary.  MyChart is used to connect with patients for Virtual Visits (Telemedicine).  Patients are able to view lab/test results, encounter notes, upcoming appointments, etc.  Non-urgent messages can be sent to your provider as well.   To learn more about what  you can do with MyChart, go to [email or web address].    Your next appointment:   2 month(s)  The format for your next appointment:   In Person  Provider:   Sherryl Manges, MD   Other Instructions  ZIO AT Long term monitor-Live Telemetry  Your physician has requested you wear a ZIO patch monitor for 14 days.  This is a single patch monitor. [employer] supplies one patch monitor per enrollment. Additional stickers are not available.  Please do not apply patch if you will be having a Nuclear Stress Test, Echocardiogram, Cardiac CT, MRI, or Chest Xray during the time frame you would be wearing the monitor. The patch cannot be worn during these tests. You cannot remove and re-apply the ZIO AT patch monitor.   Your ZIO patch monitor will be sent Fed Ex from [employer] directly to your home address. The monitor may also be mailed to a PO BOX if home delivery is not available. It may take 3-5 days to receive your monitor after you have been enrolled.  Once you have received you monitor, please review enclosed instructions. Your monitor has already been registered assigning a specific monitor serial # to you.   Applying the monitor  Shave hair from upper left chest.  Hold abrader disc by orange tab. Rub abrader in 40 strokes over left upper chest as indicated in your monitor instructions.  Clean area with 4 enclosed alcohol pads. Use all pads to ensure the area is cleaned thoroughly. Let dry.  Apply patch as indicated in monitor instructions. Patch will be placed under  collarbone on left side of chest with arrow pointing upward.  Rub patch adhesive wings for 2 minutes. Remove the white label marked "1". Remove the white label marked "2". Rub patch adhesive wings for 2 additional minutes.  While looking in a mirror, press and release button in center of patch. A small green light will flash 3-4 times. This will be your only indicator the monitor has been turned on.  Do not shower for the  first 24 hours. You may shower after the first 24 hours.  Press the button if you feel a symptom. You will hear a small click. Record Date, Time and Symptom in the Patient Log.   Starting the Gateway  In your kit there is a Hydrographic surveyor box the size of a cellphone. This is Airline pilot. It transmits all your recorded data to [facility]. This box must stay within 10 feet of you at all times. Open the box and push the * button. There will be a light that blinks orange and then green a few times. When the light stops blinking, the Gateway is connected to the ZIO patch.  Call [employer] at [phone] to confirm your monitor is transmitting.   Returning your monitor  Remove your patch and place it inside the [place]. In the lower half of the Gateway there is a white bag with prepaid postage on it. Place Gateway in bag and seal. Mail package back to [place] as soon as possible. Your physician should have your final report approximately 7 days after you have mailed back your monitor.   Call [place] at [phone] if you have questions regarding your ZIO AT patch monitor. Call them immediately if you see an orange light blinking on your monitor.  If your monitor falls off in less than 4 days contact our Monitor department at [phone]. If your monitor becomes loose or falls off after 4 days call [employer] at [phone] for suggestions on securing your monitor.

## 2020-03-04 NOTE — Progress Notes (Signed)
Patient ID: Debra Petersen, female   DOB: 1951-01-09, 69 y.o.   MRN: [id number] Patient enrolled for 14 day ZIO AT long term monitor-Live Telemetry to be shipped to her home.

## 2020-03-10 ENCOUNTER — Telehealth: Payer: Self-pay | Admitting: Internal Medicine

## 2020-03-10 NOTE — Telephone Encounter (Signed)
Carly with [employer] is calling to follow up regarding patient's Zio monitor. She states it was delivered to her home address on 03/06/20, but it has not been applied. She states she is also unable to contact the patient.

## 2020-03-11 ENCOUNTER — Telehealth: Payer: Self-pay | Admitting: *Deleted

## 2020-03-11 NOTE — Telephone Encounter (Signed)
Patient was out of town.  She spoke with [employer] today and will apply monitor tonite or tomorrow so it is completed by her MRI scheduled 03/26/20.  Instructions reviewed with patient.

## 2020-03-11 NOTE — Telephone Encounter (Signed)
Spoke with pt who states she had to go out of town unexpectedly for a family emergency.  Pt states she will be home tonight and will review instructions to place monitor.  Pt advised she may contact office for assistance if needed.  Pt verbalized understanding and thanked Therapist, sports for the call.

## 2020-03-16 ENCOUNTER — Other Ambulatory Visit: Payer: Self-pay

## 2020-03-16 DIAGNOSIS — R55 Syncope and collapse: Secondary | ICD-10-CM | POA: Diagnosis not present

## 2020-04-13 ENCOUNTER — Other Ambulatory Visit: Payer: Self-pay | Admitting: Internal Medicine

## 2020-04-29 ENCOUNTER — Telehealth: Payer: Self-pay | Admitting: Internal Medicine

## 2020-04-29 NOTE — Telephone Encounter (Signed)
The patient has been notified of the result and verbalized understanding.  All questions (if any) were answered. Wilma Flavin, RN 04/29/2020 9:59 AM

## 2020-04-29 NOTE — Telephone Encounter (Signed)
New message:    Patient calling stating that some one was to call her back last month and she has not heard from anyone concerning heart monitor. Please advise.

## 2020-04-29 NOTE — Telephone Encounter (Signed)
Pt is scheduled to see Dr Caryl Comes 05/11/2020 at 245pm.

## 2020-05-11 ENCOUNTER — Other Ambulatory Visit: Payer: Self-pay

## 2020-05-11 ENCOUNTER — Ambulatory Visit: Payer: Medicare Other | Admitting: Internal Medicine

## 2020-05-11 ENCOUNTER — Encounter: Payer: Self-pay | Admitting: Internal Medicine

## 2020-05-11 VITALS — BP 120/70 | HR 89 | Ht 63.0 in | Wt 118.0 lb

## 2020-05-11 DIAGNOSIS — I471 Supraventricular tachycardia: Secondary | ICD-10-CM | POA: Diagnosis not present

## 2020-05-11 DIAGNOSIS — I4892 Unspecified atrial flutter: Secondary | ICD-10-CM | POA: Diagnosis not present

## 2020-05-11 NOTE — Progress Notes (Signed)
Craige Cotta EOMI [address] but kf      Patient Care Team: Prince Solian, MD as PCP - General (Internal Medicine) Prince Solian, MD as Consulting Physician (Internal Medicine)   HPI  Debra Petersen is a 70 y.o. female seeing in followup of atrial flutter ablation. She presented 2013 with 1: 1 and 2:1 flutter. There there was also as I recall a 250 ms tachycardia. She was taken to the lab by Dr. Lovena Le. She was found to have atrial flutter at a cycle length of 205. She was found to have no evidence of accessory pathway and no evidence of antegrade dual AV nodal physiology.  Following her procedure she has developed recurrent palpitations with SVT confirmed by event recorder and was treated with  Rythmol,in the wake of her husband is dying, she has taken less. She had more palpitations that she has resumed it albeit irregularly. In the past she's also used adjunctive Inderal.  DATE PR interval QRSduration Dose  1/15 16 80   1/17 16 82 150 bid  3/20 162 86 150 bid    DATE TEST EF   12/10 cMRI  67 % No DGE         The patient denies chest pain, shortness of breath, nocturnal dyspnea, orthopnea or peripheral edema.  There have been no palpitations   Interval syncope.  2 episodes in December.  Associated with a prodrome of heat and nausea.  Has a remote history of syncope following childbirth but none since.  Did not recognize the prodrome.  Seen in the office orthostatics were done unfortunately they are not available in the synopsis.  Said that there was some variation.  Also interval worsening of her kidney function with recent mild interval improvement been evaluated at [facility] for renal replacement therapy in terms of dialysis and/or transplant    Date Cr K Hgb  10/19 1.83   12.6   10/21 5.1       Past Medical History:  Diagnosis Date  . CKD (chronic kidney disease)   . Lupus (systemic lupus erythematosus) ([place])   . Wide-complex tachycardia (HCC)    probable atrial flutter with  1-1 and 2-1 conduction    Past Surgical History:  Procedure Laterality Date  . ABDOMINAL HYSTERECTOMY    . CARDIAC ELECTROPHYSIOLOGY STUDY AND ABLATION  02/23/2009   of typical and atypical flutter  . COLON SURGERY    . GALLBLADDER SURGERY    . hystoectomy    . SPLENECTOMY, TOTAL      Current Outpatient Medications  Medication Sig Dispense Refill  . amLODipine (NORVASC) 5 MG tablet Take by mouth daily in the afternoon.    . pravastatin (PRAVACHOL) 20 MG tablet Take 20 mg by mouth daily.    . propafenone (RYTHMOL) 150 MG tablet TAKE 1 TABLET BY MOUTH TWICE DAILY. 60 tablet 7  . sodium bicarbonate 650 MG tablet Take 1,300 mg by mouth 3 (three) times daily.     No current facility-administered medications for this visit.    Allergies  Allergen Reactions  . Codeine Itching    Review of Systems negative except from HPI and PMH  Physical Exam BP 120/70 (BP Location: Left Arm, Patient Position: Sitting, Cuff Size: Normal)   Pulse 89   Ht '5\' 3"'$  (1.6 m)   Wt 118 lb (53.5 kg)   SpO2 98%   BMI 20.90 kg/m  Well developed and nourished in no acute distress HENT normal Neck supple with JVP-flat Clear Regular rate and rhythm,  no murmurs or gallops Abd-soft with active BS No Clubbing cyanosis edema Skin-warm and dry A & Oriented  Grossly normal sensory and motor function   ECG sinus @ 81 19/09/38 RAE Q waves V1-2 unchanged    Assessment and  Plan  Atrial tach  Hypertension   syncope  Renal Dysfunction Gd 4   Arrhythmia well controlled on propafenone.  It is metabolized hepatically primarily.  Electro-cardiographic parameters do not suggest drug accumulation  Blood pressure currently managed by renal.  Interval syncope sounds as if neurally mediated with a consistent and suggestive prodrome.  The trigger not evident.  Event recorder was reviewed with the patient showed some nonsustained tachycardia.  Longstanding however, it was hard to invoke it as the cause of  syncope.  Apparently she had modest orthostatic changes in the office; they are not available for my review  Worsening renal function.  Being evaluated at [facility].

## 2020-05-11 NOTE — Patient Instructions (Signed)

## 2020-06-23 ENCOUNTER — Encounter (HOSPITAL_COMMUNITY): Payer: Self-pay

## 2020-06-23 ENCOUNTER — Emergency Department (HOSPITAL_COMMUNITY): Payer: Medicare Other

## 2020-06-23 ENCOUNTER — Inpatient Hospital Stay (HOSPITAL_COMMUNITY)
Admission: EM | Admit: 2020-06-23 | Discharge: 2020-07-01 | DRG: 682 | Disposition: A | Discharge status: Home / Self Care | Payer: Medicare Other | Attending: Family Medicine | Admitting: Family Medicine

## 2020-06-23 ENCOUNTER — Other Ambulatory Visit: Payer: Self-pay

## 2020-06-23 DIAGNOSIS — J069 Acute upper respiratory infection, unspecified: Secondary | ICD-10-CM | POA: Diagnosis present

## 2020-06-23 DIAGNOSIS — E872 Acidosis: Secondary | ICD-10-CM | POA: Diagnosis present

## 2020-06-23 DIAGNOSIS — R053 Chronic cough: Secondary | ICD-10-CM | POA: Diagnosis present

## 2020-06-23 DIAGNOSIS — Z6820 Body mass index (BMI) 20.0-20.9, adult: Secondary | ICD-10-CM

## 2020-06-23 DIAGNOSIS — E876 Hypokalemia: Secondary | ICD-10-CM | POA: Diagnosis present

## 2020-06-23 DIAGNOSIS — I471 Supraventricular tachycardia: Secondary | ICD-10-CM | POA: Diagnosis present

## 2020-06-23 DIAGNOSIS — R112 Nausea with vomiting, unspecified: Secondary | ICD-10-CM | POA: Diagnosis present

## 2020-06-23 DIAGNOSIS — Q8901 Asplenia (congenital): Secondary | ICD-10-CM | POA: Diagnosis not present

## 2020-06-23 DIAGNOSIS — E860 Dehydration: Secondary | ICD-10-CM | POA: Diagnosis present

## 2020-06-23 DIAGNOSIS — I4892 Unspecified atrial flutter: Secondary | ICD-10-CM | POA: Diagnosis present

## 2020-06-23 DIAGNOSIS — E875 Hyperkalemia: Secondary | ICD-10-CM | POA: Diagnosis present

## 2020-06-23 DIAGNOSIS — D509 Iron deficiency anemia, unspecified: Secondary | ICD-10-CM | POA: Diagnosis present

## 2020-06-23 DIAGNOSIS — D75839 Thrombocytosis, unspecified: Secondary | ICD-10-CM | POA: Diagnosis present

## 2020-06-23 DIAGNOSIS — Z885 Allergy status to narcotic agent status: Secondary | ICD-10-CM | POA: Diagnosis not present

## 2020-06-23 DIAGNOSIS — E785 Hyperlipidemia, unspecified: Secondary | ICD-10-CM | POA: Diagnosis present

## 2020-06-23 DIAGNOSIS — M329 Systemic lupus erythematosus, unspecified: Secondary | ICD-10-CM | POA: Diagnosis present

## 2020-06-23 DIAGNOSIS — N185 Chronic kidney disease, stage 5: Secondary | ICD-10-CM | POA: Diagnosis present

## 2020-06-23 DIAGNOSIS — Z20822 Contact with and (suspected) exposure to covid-19: Secondary | ICD-10-CM | POA: Diagnosis present

## 2020-06-23 DIAGNOSIS — D631 Anemia in chronic kidney disease: Secondary | ICD-10-CM | POA: Diagnosis present

## 2020-06-23 DIAGNOSIS — I482 Chronic atrial fibrillation, unspecified: Secondary | ICD-10-CM | POA: Diagnosis present

## 2020-06-23 DIAGNOSIS — Z8249 Family history of ischemic heart disease and other diseases of the circulatory system: Secondary | ICD-10-CM | POA: Diagnosis not present

## 2020-06-23 DIAGNOSIS — N184 Chronic kidney disease, stage 4 (severe): Secondary | ICD-10-CM | POA: Diagnosis not present

## 2020-06-23 DIAGNOSIS — Z79899 Other long term (current) drug therapy: Secondary | ICD-10-CM

## 2020-06-23 DIAGNOSIS — R059 Cough, unspecified: Secondary | ICD-10-CM

## 2020-06-23 DIAGNOSIS — N179 Acute kidney failure, unspecified: Secondary | ICD-10-CM | POA: Diagnosis present

## 2020-06-23 DIAGNOSIS — I120 Hypertensive chronic kidney disease with stage 5 chronic kidney disease or end stage renal disease: Secondary | ICD-10-CM | POA: Diagnosis present

## 2020-06-23 DIAGNOSIS — J189 Pneumonia, unspecified organism: Secondary | ICD-10-CM | POA: Diagnosis not present

## 2020-06-23 DIAGNOSIS — E440 Moderate protein-calorie malnutrition: Secondary | ICD-10-CM | POA: Diagnosis present

## 2020-06-23 DIAGNOSIS — K529 Noninfective gastroenteritis and colitis, unspecified: Secondary | ICD-10-CM | POA: Diagnosis present

## 2020-06-23 LAB — BASIC METABOLIC PANEL
Anion gap: 10 (ref 5–15)
BUN: 43 mg/dL — ABNORMAL HIGH (ref 8–23)
CO2: 13 mmol/L — ABNORMAL LOW (ref 22–32)
Calcium: 8.6 mg/dL — ABNORMAL LOW (ref 8.9–10.3)
Chloride: 113 mmol/L — ABNORMAL HIGH (ref 98–111)
Creatinine, Ser: 6.5 mg/dL — ABNORMAL HIGH (ref 0.44–1.00)
GFR, Estimated: 6 mL/min — ABNORMAL LOW (ref >60)
Glucose, Bld: 107 mg/dL — ABNORMAL HIGH (ref 70–99)
Potassium: 3.5 mmol/L (ref 3.5–5.1)
Sodium: 136 mmol/L (ref 135–145)

## 2020-06-23 LAB — URINALYSIS, ROUTINE W REFLEX MICROSCOPIC
Bilirubin Urine: NEGATIVE
Glucose, UA: NEGATIVE mg/dL
Ketones, ur: NEGATIVE mg/dL
Leukocytes,Ua: NEGATIVE
Nitrite: NEGATIVE
Protein, ur: 30 mg/dL — AB
Specific Gravity, Urine: 1.004 — ABNORMAL LOW (ref 1.005–1.030)
pH: 6 (ref 5.0–8.0)

## 2020-06-23 LAB — BLOOD GAS, VENOUS
Acid-base deficit: 13.2 mmol/L — ABNORMAL HIGH (ref 0.0–2.0)
Bicarbonate: 14 mmol/L — ABNORMAL LOW (ref 20.0–28.0)
FIO2: 21
O2 Saturation: 39 %
Patient temperature: 98.6
pCO2, Ven: 39.8 mmHg — ABNORMAL LOW (ref 44.0–60.0)
pH, Ven: 7.173 — CL (ref 7.250–7.430)
pO2, Ven: <31 mmHg — CL (ref 32.0–45.0)

## 2020-06-23 LAB — CBC WITH DIFFERENTIAL/PLATELET
Abs Immature Granulocytes: 0.07 10*3/uL (ref 0.00–0.07)
Basophils Absolute: 0.1 10*3/uL (ref 0.0–0.1)
Basophils Relative: 1 %
Eosinophils Absolute: 0.3 10*3/uL (ref 0.0–0.5)
Eosinophils Relative: 4 %
HCT: 25.9 % — ABNORMAL LOW (ref 36.0–46.0)
Hemoglobin: 8.9 g/dL — ABNORMAL LOW (ref 12.0–15.0)
Immature Granulocytes: 1 %
Lymphocytes Relative: 29 %
Lymphs Abs: 2.5 10*3/uL (ref 0.7–4.0)
MCH: 33.3 pg (ref 26.0–34.0)
MCHC: 34.4 g/dL (ref 30.0–36.0)
MCV: 97 fL (ref 80.0–100.0)
Monocytes Absolute: 1 10*3/uL (ref 0.1–1.0)
Monocytes Relative: 12 %
Neutro Abs: 4.6 10*3/uL (ref 1.7–7.7)
Neutrophils Relative %: 53 %
Platelets: 521 10*3/uL — ABNORMAL HIGH (ref 150–400)
RBC: 2.67 MIL/uL — ABNORMAL LOW (ref 3.87–5.11)
RDW: 15.8 % — ABNORMAL HIGH (ref 11.5–15.5)
WBC: 8.5 10*3/uL (ref 4.0–10.5)
nRBC: 0.4 % — ABNORMAL HIGH (ref 0.0–0.2)

## 2020-06-23 LAB — CREATININE, URINE, RANDOM: Creatinine, Urine: 39.44 mg/dL

## 2020-06-23 LAB — SODIUM, URINE, RANDOM: Sodium, Ur: 51 mmol/L

## 2020-06-23 MED ORDER — STERILE WATER FOR INJECTION IV SOLN
INTRAVENOUS | Status: DC
  Filled 2020-06-23 (×2): qty 1000
  Filled 2020-06-23: qty 150
  Filled 2020-06-23: qty 1000

## 2020-06-23 MED ORDER — SODIUM CHLORIDE 0.9 % IV BOLUS
1000.0000 mL | Freq: Once | INTRAVENOUS | Status: AC
  Administered 2020-06-23: 1000 mL via INTRAVENOUS

## 2020-06-23 MED ORDER — SODIUM CHLORIDE 0.9 % IV SOLN
12.5000 mg | Freq: Four times a day (QID) | INTRAVENOUS | Status: DC | PRN
  Administered 2020-06-23 (×2): 12.5 mg via INTRAVENOUS
  Filled 2020-06-23 (×3): qty 0.5

## 2020-06-23 MED ORDER — SODIUM CHLORIDE 0.9 % IV BOLUS: 1000.0000 mL | Freq: Once | INTRAVENOUS | Status: DC

## 2020-06-23 NOTE — H&P (Signed)
History and Physical    Debra Petersen [id number] DOB: September 03, 1950 DOA: 06/23/2020  PCP: Prince Solian, MD  Patient coming from: Home  Chief Complaint: N/V  HPI: Debra Petersen is a 70 y.o. female with medical history significant of SLE, CKD IV,  HTN, HLD. Presenting with N/V. She reports that he symptoms started as cough and chills about 10 days ago. She called her PCP for an antibiotic prescription. She felt that she was initially getting better, but then she worsened over the last 6 days. She now has N/V/dry heaves. She grew very weak. She tried a clear liquid diet but it didn't help. She saw her PCP this morning and had labwork drawn. After seeing the results, it was recommended that she come to the ED. She denies any other aggravating or alleviating factors.   ED Course: She was found to have a creatinine of 6.5 and HCO3- of 13. Nephrology was consulted. [facility] was called for admission.    Review of Systems:  Denies CP, palpitations, dyspnea, hematemesis, hematochezia, syncopal episodes. Review of systems is otherwise negative for all not mentioned in HPI.   PMHx Past Medical History:  Diagnosis Date  . CKD (chronic kidney disease)   . Lupus (systemic lupus erythematosus) ([place])   . Wide-complex tachycardia (HCC)    probable atrial flutter with 1-1 and 2-1 conduction    PSHx Past Surgical History:  Procedure Laterality Date  . ABDOMINAL HYSTERECTOMY    . CARDIAC ELECTROPHYSIOLOGY STUDY AND ABLATION  02/23/2009   of typical and atypical flutter  . COLON SURGERY    . GALLBLADDER SURGERY    . hystoectomy    . SPLENECTOMY, TOTAL      SocHx  reports that she has never smoked. She has never used smokeless tobacco. She reports previous alcohol use. She reports that she does not use drugs.  Allergies  Allergen Reactions  . Codeine Itching    FamHx Family History  Problem Relation Age of Onset  . Coronary artery disease Mother        had a pacemaker  . Coronary artery disease  Father     Prior to Admission medications   Medication Sig Start Date End Date Taking? Authorizing Provider  acetaminophen (TYLENOL) 500 MG tablet Take 1,000 mg by mouth every 6 (six) hours as needed for mild pain.   Yes [provider]  amLODipine (NORVASC) 5 MG tablet Take by mouth daily in the afternoon.   Yes [provider]  pravastatin (PRAVACHOL) 20 MG tablet Take 20 mg by mouth daily.   Yes [provider]  propafenone (RYTHMOL) 150 MG tablet TAKE 1 TABLET BY MOUTH TWICE DAILY. Patient taking differently: Take 150 mg by mouth in the morning and at bedtime. 04/14/20  Yes Deboraha Sprang, MD  sodium bicarbonate 650 MG tablet Take 1,300 mg by mouth 3 (three) times daily. 09/14/19  Yes [provider]  cefdinir (OMNICEF) 300 MG capsule Take 300 mg by mouth 2 (two) times daily. 06/12/20   [provider]  promethazine (PHENERGAN) 25 MG tablet Take 25 mg by mouth every 8 (eight) hours as needed for nausea. 06/23/20   [provider]    Physical Exam: Vitals:   06/23/20 1345 06/23/20 1400 06/23/20 1415 06/23/20 1430  BP:  (!) 159/140  (!) 136/109  Pulse: 93 99 94 92  Resp:    16  Temp:      TempSrc:      SpO2: 97% 90% 100% 100%  General: 70 y.o. female resting in bed in NAD Eyes: PERRL, normal sclera ENMT: Nares patent w/o discharge, orophaynx clear, dentition normal, ears w/o discharge/lesions/ulcers Neck: Supple, trachea midline Cardiovascular: tachy, +S1, S2, no m/g/r, equal pulses throughout Respiratory: CTABL, no w/r/r, normal WOB GI: BS+, NDNT, no masses noted, no organomegaly noted MSK: No e/c/c Skin: No rashes, bruises, ulcerations noted Neuro: A&O x 3, no focal deficits Psyc: Appropriate interaction and affect, calm/cooperative  Labs on Admission: I have personally reviewed following labs and imaging studies  CBC: No results for input(s): WBC, NEUTROABS, HGB, HCT, MCV, PLT in the last 168 hours. Basic Metabolic  Panel: Recent Labs  Lab 06/23/20 1102  NA 136  K 3.5  CL 113*  CO2 13*  GLUCOSE 107*  BUN 43*  CREATININE 6.50*  CALCIUM 8.6*   GFR: CrCl cannot be calculated (Unknown ideal weight.). Liver Function Tests: No results for input(s): AST, ALT, ALKPHOS, BILITOT, PROT, ALBUMIN in the last 168 hours. No results for input(s): LIPASE, AMYLASE in the last 168 hours. No results for input(s): AMMONIA in the last 168 hours. Coagulation Profile: No results for input(s): INR, PROTIME in the last 168 hours. Cardiac Enzymes: No results for input(s): CKTOTAL, CKMB, CKMBINDEX, TROPONINI in the last 168 hours. BNP (last 3 results) No results for input(s): PROBNP in the last 8760 hours. HbA1C: No results for input(s): HGBA1C in the last 72 hours. CBG: No results for input(s): GLUCAP in the last 168 hours. Lipid Profile: No results for input(s): CHOL, HDL, LDLCALC, TRIG, CHOLHDL, LDLDIRECT in the last 72 hours. Thyroid Function Tests: No results for input(s): TSH, T4TOTAL, FREET4, T3FREE, THYROIDAB in the last 72 hours. Anemia Panel: No results for input(s): VITAMINB12, FOLATE, FERRITIN, TIBC, IRON, RETICCTPCT in the last 72 hours. Urine analysis:    Component Value Date/Time   COLORURINE STRAW (A) 10/03/2019 [place] 10/03/2019 0557   LABSPEC 1.005 10/03/2019 0557   PHURINE 5.0 10/03/2019 0557   GLUCOSEU NEGATIVE 10/03/2019 0557   HGBUR MODERATE (A) 10/03/2019 0557   BILIRUBINUR NEGATIVE 10/03/2019 0557   [place] 10/03/2019 0557   PROTEINUR NEGATIVE 10/03/2019 0557   UROBILINOGEN 0.2 02/19/2009 1839   NITRITE NEGATIVE 10/03/2019 0557   LEUKOCYTESUR MODERATE (A) 10/03/2019 0557    Radiological Exams on Admission: No results found.  EKG: Independently reviewed. Sinus, no st elevations  Assessment/Plan AKI on CKD4 Non-gap metabolic acidosis     - admit to inpt, progressive @ [facility]     - nephro onboard, appreciate assistance     - bicarb gtt     - will  defer further management to nephro  N/V     - compazine, fluids     - she wants to eat, will start diet  HTN     - continue norvasc  HLD     - continue statin  Normocytic anemia     - check iron studies  Hx of SVT     - on propafenone, continue  Hx of SLE     - continue outpt follow up  DVT prophylaxis: heparin  Code Status: FULL  Family Communication: None at bedside  Consults called: Nephrology  Status is: Inpatient  Remains inpatient appropriate because:Inpatient level of care appropriate due to severity of illness   Dispo: The patient is from: Home              Anticipated d/c is to: Home  Patient currently is not medically stable to d/c.   Difficult to place patient No  Time spent coordinating admission: 75 miuntes  [place] [facility]  If 7PM-7AM, please contact night-coverage [email or web address]  06/23/2020, 3:28 PM

## 2020-06-23 NOTE — ED Notes (Signed)
Attempted to call report to [facility]. RN is currently in report, she says that she will call back as soon as she is able.

## 2020-06-23 NOTE — ED Triage Notes (Signed)
Patient coming from doctor office with c/o dehydration.

## 2020-06-23 NOTE — Consult Note (Signed)
Renal Service Consult Note [facility]  Debra Petersen 06/23/2020 Sol Blazing, MD Requesting Physician: Dr. Danice Goltz  Reason for Consult: CKD patient w/ dehydration HPI: The patient is a 70 y.o. year-old w/ hx of wide-complex tachycardia, SLE, CKD stage IV.  Pt went to PCP's office today after having a URI this week, was taking an abx for a few days. Po intake has been down.  PCP checked labs and her creat was around 7, so she was sent to the ED.  In the ED Na 136  K 3.5  CO2 13  AG 10 UBN 43 and creat 6.50.  Pt feels she is dehydrated and is getting IVF's and is to be admitted.  We are asked to see for renal failure.  Pt is f/b Dr Johnney Ou at [facility] for her CKD.  Pt states she had a bad diarrheal illness for several weeks and her creat rose up to 5.8 last fall, but then it improved down to 3.7 after rehydrating and she hasn't had diarrhea issues since then.    In the last 6 days or so she has had refractory nausea w/ vomiting and then dry heaves.  This has started to L[place] up yesterday and she ate one good meal, then it returned last night. Today in the ED she is hungry and wants to eat.  She denies any confusion, jerking or chest pain   ROS  denies CP  no joint pain   no HA  no blurry vision  no rash  no diarrhea  no nausea/ vomiting  no dysuria  no difficulty voiding  no change in urine color    Past Medical History  Past Medical History:  Diagnosis Date  . CKD (chronic kidney disease)   . Lupus (systemic lupus erythematosus) ([place])   . Wide-complex tachycardia (HCC)    probable atrial flutter with 1-1 and 2-1 conduction   Past Surgical History  Past Surgical History:  Procedure Laterality Date  . ABDOMINAL HYSTERECTOMY    . CARDIAC ELECTROPHYSIOLOGY STUDY AND ABLATION  02/23/2009   of typical and atypical flutter  . COLON SURGERY    . GALLBLADDER SURGERY    . hystoectomy    . SPLENECTOMY, TOTAL     Family History  Family History  Problem Relation Age  of Onset  . Coronary artery disease Mother        had a pacemaker  . Coronary artery disease Father    Social History  reports that she has never smoked. She has never used smokeless tobacco. She reports previous alcohol use. She reports that she does not use drugs. Allergies  Allergies  Allergen Reactions  . Codeine Itching   Home medications Prior to Admission medications   Medication Sig Start Date End Date Taking? Authorizing Provider  acetaminophen (TYLENOL) 500 MG tablet Take 1,000 mg by mouth every 6 (six) hours as needed for mild pain.   Yes [provider]  amLODipine (NORVASC) 5 MG tablet Take by mouth daily in the afternoon.   Yes [provider]  pravastatin (PRAVACHOL) 20 MG tablet Take 20 mg by mouth daily.   Yes [provider]  propafenone (RYTHMOL) 150 MG tablet TAKE 1 TABLET BY MOUTH TWICE DAILY. Patient taking differently: Take 150 mg by mouth in the morning and at bedtime. 04/14/20  Yes Deboraha Sprang, MD  sodium bicarbonate 650 MG tablet Take 1,300 mg by mouth 3 (three) times daily. 09/14/19  Yes [provider]  cefdinir (  OMNICEF) 300 MG capsule Take 300 mg by mouth 2 (two) times daily. 06/12/20   [provider]  promethazine (PHENERGAN) 25 MG tablet Take 25 mg by mouth every 8 (eight) hours as needed for nausea. 06/23/20   [provider]     Vitals:   06/23/20 1215 06/23/20 1230 06/23/20 1300 06/23/20 1330  BP:  107/61 126/78 137/77  Pulse: 90 92 90 91  Resp:  $Remo'15 17 16  'dNiXT$ Temp:      TempSrc:      SpO2: 96% 100% 98% 98%   Exam Gen alert, no distress, elderly pleasant lady in no distress No rash, cyanosis or gangrene Sclera anicteric, throat clear  No jvd or bruits Chest clear bilat to bases, no rales/ wheezing RRR no MRG Abd soft ntnd no mass or ascites +bs GU defer MS no joint effusions or deformity Ext no LE or UE edema, no wounds or ulcers Neuro is alert, Ox 3 , nf       Home meds:  - norvasc  5 hs/ propafenone 150 bid/ sod bicarb 1300 tid  - pravastatin 20 qd  - prn's/ vitamins/ supplements    PH ven  7.173/ pCO2 39 / pO2 < 31    Na 136  K 3.5  CO2 13  AG 10 eGFR 6 ml/min  BUN 43  Cr 6.50        Date  Creat  eGFR     09/16/2019  2.87  16 ml/min     7/28- 10/06/19 4.69 >> 3.38   AKI episode     11/13/19   3.66  Office lab      11/31/2021  5.39  9 ml/min, at [facility]     12/25/19  5.10  Office lab      03/20/20   4.49  Office lab    Assessment/ Plan: 1. AKI on CKD V - vs progression of disease. Baseline creat unclear, maybe is in the 3.5- 5.0 range. Had a bad episode of dehydration / diarrhea -related last year w/ peak creat 5.8 but had recovered/ improved from that.  Creat here is 6.5 in setting of about 1 week of nausea and vomiting. Pt w/o edema by exam. Agree w/ admission for IVF"s, use bicarb for now (NS or LR boluses). No gross uremic symptoms.  Nausea is often a uremic symptom however pt is feeling better today so will see if her renal function and nausea improve w/ time and IVF's. Please admit to [facility].  2. HTN - stable BP's here, cont home meds, avoid acei/ arb w/ aki 3. HL - per pmd 4. H/o SLE 5. Hx arrhythmia - on propafenone      Kelly Splinter  MD 06/23/2020, 1:49 PM  No results for input(s): WBC, HGB in the last 168 hours. Recent Labs  Lab 06/23/20 1102  K 3.5  BUN 43*  CREATININE 6.50*  CALCIUM 8.6*

## 2020-06-23 NOTE — ED Notes (Signed)
US at bedside

## 2020-06-23 NOTE — ED Provider Notes (Signed)
[facility] DEPT Provider Note   CSN: [id number] Arrival date & time: 06/23/20  1011     History No chief complaint on file.   Debra Petersen is a 70 y.o. female.  Debra Petersen has a history of chronic kidney disease and lupus.  She presents with decreased renal function.  She was seen at her PCPs office today, and her serum creatinine was 7.  Her BUN was 40, and her sodium and potassium were normal.  Earlier in the week, she developed an upper respiratory illness, and she states that she is feeling a little bit better from that.  She has been taking an antibiotic that was prescribed by her primary care doctor.  She also states that her oral intake has been down secondary to nausea.  The history is provided by the patient.  Emesis Severity: nausea. Timing:  Constant Able to tolerate:  Liquids and solids Progression:  Unchanged Chronicity:  New Recent urination:  Normal Relieved by:  Nothing Worsened by:  Nothing Ineffective treatments:  None tried Associated symptoms: URI   Associated symptoms: no abdominal pain, no arthralgias, no chills, no cough, no diarrhea, no fever and no sore throat        Past Medical History:  Diagnosis Date  . CKD (chronic kidney disease)   . Lupus (systemic lupus erythematosus) ([place])   . Wide-complex tachycardia (HCC)    probable atrial flutter with 1-1 and 2-1 conduction    Patient Active Problem List   Diagnosis Date Noted  . Diarrhea 10/03/2019  . Acute kidney injury superimposed on chronic kidney disease ([place]) 10/03/2019  . Dehydration 10/03/2019  . Acute-on-chronic kidney injury ([place]) 10/03/2019  . Stress due to illness of family member 03/19/2013  . HYPERTENSION, BENIGN 12/03/2009  . Hypothyroidism 04/02/2009  . SVT/ PSVT/ PAT 04/02/2009  . ATRIAL FLUTTER 04/02/2009  . ABNORMAL ELECTROCARDIOGRAM 04/02/2009    Past Surgical History:  Procedure Laterality Date  . ABDOMINAL HYSTERECTOMY    .  CARDIAC ELECTROPHYSIOLOGY STUDY AND ABLATION  02/23/2009   of typical and atypical flutter  . COLON SURGERY    . GALLBLADDER SURGERY    . hystoectomy    . SPLENECTOMY, TOTAL       OB History   No obstetric history on file.     Family History  Problem Relation Age of Onset  . Coronary artery disease Mother        had a pacemaker  . Coronary artery disease Father     Social History   Tobacco Use  . Smoking status: Never Smoker  . Smokeless tobacco: Never Used  Vaping Use  . Vaping Use: Never used  Substance Use Topics  . Alcohol use: Not Currently  . Drug use: Never    Home Medications Prior to Admission medications   Medication Sig Start Date End Date Taking? Authorizing Provider  amLODipine (NORVASC) 5 MG tablet Take by mouth daily in the afternoon.    [provider]  pravastatin (PRAVACHOL) 20 MG tablet Take 20 mg by mouth daily.    [provider]  propafenone (RYTHMOL) 150 MG tablet TAKE 1 TABLET BY MOUTH TWICE DAILY. 04/14/20   Deboraha Sprang, MD  sodium bicarbonate 650 MG tablet Take 1,300 mg by mouth 3 (three) times daily. 09/14/19   [provider]    Allergies    Codeine  Review of Systems   Review of Systems  Constitutional: Negative for chills and fever.  HENT: Negative for  ear pain and sore throat.   Eyes: Negative for pain and visual disturbance.  Respiratory: Negative for cough and shortness of breath.   Cardiovascular: Negative for chest pain and palpitations.  Gastrointestinal: Positive for vomiting. Negative for abdominal pain and diarrhea.  Genitourinary: Negative for dysuria and hematuria.  Musculoskeletal: Negative for arthralgias and back pain.  Skin: Negative for color change and rash.  Neurological: Negative for seizures and syncope.  All other systems reviewed and are negative.   Physical Exam Updated Vital Signs BP 129/68 (BP Location: Right Arm)   Pulse 88   Temp 97.8 F (36.6 C) (Oral)   Resp 18    SpO2 100%   Physical Exam Vitals and nursing note reviewed.  Constitutional:      General: She is not in acute distress.    Appearance: She is well-developed.  HENT:     Head: Normocephalic and atraumatic.  Eyes:     Conjunctiva/sclera: Conjunctivae normal.  Cardiovascular:     Rate and Rhythm: Normal rate and regular rhythm.     Heart sounds: No murmur heard.   Pulmonary:     Effort: Pulmonary effort is normal. No respiratory distress.     Breath sounds: Normal breath sounds.  Abdominal:     Palpations: Abdomen is soft.     Tenderness: There is no abdominal tenderness.  Musculoskeletal:     Cervical back: Neck supple.  Skin:    General: Skin is warm and dry.  Neurological:     General: No focal deficit present.     Mental Status: She is alert.  Psychiatric:        Mood and Affect: Mood normal.     ED Results / Procedures / Treatments   Labs (all labs ordered are listed, but only abnormal results are displayed) Labs Reviewed  BASIC METABOLIC PANEL - Abnormal; Notable for the following components:      Result Value   Chloride 113 (*)    CO2 13 (*)    Glucose, Bld 107 (*)    BUN 43 (*)    Creatinine, Ser 6.50 (*)    Calcium 8.6 (*)    GFR, Estimated 6 (*)    All other components within normal limits  BLOOD GAS, VENOUS - Abnormal; Notable for the following components:   pH, Ven 7.173 (*)    pCO2, Ven 39.8 (*)    pO2, Ven <31.0 (*)    Bicarbonate 14.0 (*)    Acid-base deficit 13.2 (*)    All other components within normal limits  SARS CORONAVIRUS 2 (TAT 6-24 HRS)  CREATININE, URINE, RANDOM  SODIUM, URINE, RANDOM  URINALYSIS, ROUTINE W REFLEX MICROSCOPIC  CBC WITH DIFFERENTIAL/PLATELET    EKG None  Radiology No results found.  Procedures Procedures   Medications Ordered in ED Medications  promethazine (PHENERGAN) 12.5 mg in sodium chloride 0.9 % 50 mL IVPB (has no administration in time range)  sodium chloride 0.9 % bolus 1,000 mL (has no  administration in time range)    ED Course  I have reviewed the triage vital signs and the nursing notes.  Pertinent labs & imaging results that were available during my care of the patient were reviewed by me and considered in my medical decision making (see chart for details).  Clinical Course as of 06/23/20 1524  Tue Jun 23, 2020  1516 Dr. Marylyn Ishihara with [facility] will admit. The patient has been seen by Dr. Barry Dienes with nephrology. [AW]    Clinical Course User Index [  AW] Arnaldo Natal, MD   MDM Rules/Calculators/A&P                          With worsening of her chronic kidney disease possibly secondary to a recent viral illness but also possibly progression of her illness.  She was found to be quite acidotic and was treated with a bicarb drip.  Nephrology will follow her care, and she will be admitted by the hospitalist service. Final Clinical Impression(s) / ED Diagnoses Final diagnoses:  Acute renal failure superimposed on stage 5 chronic kidney disease, not on chronic dialysis, unspecified acute renal failure type [facility])    Rx / DC Orders ED Discharge Orders    None       Arnaldo Natal, MD 06/23/20 1531

## 2020-06-24 ENCOUNTER — Inpatient Hospital Stay: Payer: Self-pay

## 2020-06-24 LAB — MAGNESIUM: Magnesium: 1.2 mg/dL — ABNORMAL LOW (ref 1.7–2.4)

## 2020-06-24 LAB — BASIC METABOLIC PANEL
Anion gap: 7 (ref 5–15)
BUN: 29 mg/dL — ABNORMAL HIGH (ref 8–23)
CO2: 25 mmol/L (ref 22–32)
Calcium: 7.3 mg/dL — ABNORMAL LOW (ref 8.9–10.3)
Chloride: 109 mmol/L (ref 98–111)
Creatinine, Ser: 5.14 mg/dL — ABNORMAL HIGH (ref 0.44–1.00)
GFR, Estimated: 9 mL/min — ABNORMAL LOW (ref >60)
Glucose, Bld: 84 mg/dL (ref 70–99)
Potassium: 2.9 mmol/L — ABNORMAL LOW (ref 3.5–5.1)
Sodium: 141 mmol/L (ref 135–145)

## 2020-06-24 LAB — HEPATIC FUNCTION PANEL
ALT: 14 U/L (ref 0–44)
AST: 15 U/L (ref 15–41)
Albumin: 2.3 g/dL — ABNORMAL LOW (ref 3.5–5.0)
Alkaline Phosphatase: 85 U/L (ref 38–126)
Bilirubin, Direct: <0.1 mg/dL (ref 0.0–0.2)
Total Bilirubin: 0.4 mg/dL (ref 0.3–1.2)
Total Protein: 5.6 g/dL — ABNORMAL LOW (ref 6.5–8.1)

## 2020-06-24 LAB — IRON AND TIBC
Iron: 20 ug/dL — ABNORMAL LOW (ref 28–170)
Saturation Ratios: 12 % (ref 10.4–31.8)
TIBC: 162 ug/dL — ABNORMAL LOW (ref 250–450)
UIBC: 142 ug/dL

## 2020-06-24 LAB — RENAL FUNCTION PANEL
Albumin: 2.1 g/dL — ABNORMAL LOW (ref 3.5–5.0)
Anion gap: 8 (ref 5–15)
BUN: 30 mg/dL — ABNORMAL HIGH (ref 8–23)
CO2: 20 mmol/L — ABNORMAL LOW (ref 22–32)
Calcium: 7.5 mg/dL — ABNORMAL LOW (ref 8.9–10.3)
Chloride: 112 mmol/L — ABNORMAL HIGH (ref 98–111)
Creatinine, Ser: 5.2 mg/dL — ABNORMAL HIGH (ref 0.44–1.00)
GFR, Estimated: 8 mL/min — ABNORMAL LOW (ref >60)
Glucose, Bld: 89 mg/dL (ref 70–99)
Phosphorus: 3.9 mg/dL (ref 2.5–4.6)
Potassium: 2.7 mmol/L — CL (ref 3.5–5.1)
Sodium: 140 mmol/L (ref 135–145)

## 2020-06-24 LAB — SARS CORONAVIRUS 2 (TAT 6-24 HRS): SARS Coronavirus 2: NEGATIVE

## 2020-06-24 MED ORDER — POTASSIUM CHLORIDE 10 MEQ/100ML IV SOLN
10.0000 meq | INTRAVENOUS | Status: DC
  Administered 2020-06-24: 10 meq via INTRAVENOUS
  Filled 2020-06-24: qty 100

## 2020-06-24 MED ORDER — POTASSIUM CHLORIDE CRYS ER 20 MEQ PO TBCR
40.0000 meq | EXTENDED_RELEASE_TABLET | Freq: Once | ORAL | Status: AC
  Administered 2020-06-24: 40 meq via ORAL
  Filled 2020-06-24: qty 2

## 2020-06-24 MED ORDER — AMLODIPINE BESYLATE 5 MG PO TABS
5.0000 mg | ORAL_TABLET | Freq: Every day | ORAL | Status: DC
  Administered 2020-06-25 – 2020-07-01 (×7): 5 mg via ORAL
  Filled 2020-06-24 (×8): qty 1

## 2020-06-24 MED ORDER — ENOXAPARIN SODIUM 40 MG/0.4ML ~~LOC~~ SOLN: 40.0000 mg | SUBCUTANEOUS | Status: DC

## 2020-06-24 MED ORDER — POTASSIUM CHLORIDE 10 MEQ/100ML IV SOLN: 10.0000 meq | INTRAVENOUS | Status: DC

## 2020-06-24 MED ORDER — FERROUS SULFATE 325 (65 FE) MG PO TABS
325.0000 mg | ORAL_TABLET | Freq: Two times a day (BID) | ORAL | Status: DC
  Administered 2020-06-24 – 2020-07-01 (×14): 325 mg via ORAL
  Filled 2020-06-24 (×15): qty 1

## 2020-06-24 MED ORDER — PRAVASTATIN SODIUM 10 MG PO TABS
20.0000 mg | ORAL_TABLET | Freq: Every day | ORAL | Status: DC
  Administered 2020-06-25 – 2020-06-30 (×6): 20 mg via ORAL
  Filled 2020-06-24 (×6): qty 2

## 2020-06-24 MED ORDER — MAGNESIUM SULFATE 4 GM/100ML IV SOLN
4.0000 g | Freq: Once | INTRAVENOUS | Status: DC
  Filled 2020-06-24: qty 100

## 2020-06-24 MED ORDER — PROPAFENONE HCL 150 MG PO TABS
150.0000 mg | ORAL_TABLET | Freq: Two times a day (BID) | ORAL | Status: DC
  Administered 2020-06-24 – 2020-07-01 (×14): 150 mg via ORAL
  Filled 2020-06-24 (×15): qty 1

## 2020-06-24 MED ORDER — HEPARIN SODIUM (PORCINE) 5000 UNIT/ML IJ SOLN
5000.0000 [IU] | Freq: Two times a day (BID) | INTRAMUSCULAR | Status: DC
  Administered 2020-06-24 – 2020-07-01 (×15): 5000 [IU] via SUBCUTANEOUS
  Filled 2020-06-24 (×15): qty 1

## 2020-06-24 NOTE — Progress Notes (Addendum)
PROGRESS NOTE    Debra Petersen  [id number] DOB: 1950/12/13 DOA: 06/23/2020 PCP: Prince Solian, MD   No chief complaint on file.   Brief Narrative: 70 year old female with PMH of Recurrent Atrial Flutter s/p ablation on Propafenone and Chronic Kidney Disease Stage 4 who follows with Dr. Johnney Ou presents to the ED on 4/19 with Creatinine of 6.5 mg/dL with baseline ~ 4 mg/dL.  Patient states she recently recovered from an acute virus that lasted a week where she had nausea and vomiting and dry heaves.  She was managed with Sodium Bicarbonate Drip overnight.  This morning her IV infiltrated and this was stopped.  Subjective: Patient is doing well this morning. Denies any abdominal pain, nausea, vomiting, or diarrhea. She appears anxious about her kidney disease.   Assessment & Plan: Active Problems:   AKI (acute kidney injury) ([place])  AKI on CKD4: - Creatinine improved from 6.5 to 5.2 mg/dL with GFR 8 mL/min after receiving bicarbonate drip.  Bicarbonate is up to 20 mmol/L. - This was discontinued to difficulty with access.  Nephrology did not recommend PICC placement as patient will likely need dialysis in the near future.  She will follow up with her regular Nephrologist to discuss options of peritoneal dialysis vs hemodialysis.  Hypokalemia: - Potassium is 2.7 mmol/L. - Patient's IV infiltrated and she is unable to receive IV Potassium. - I will give Kdur 40 MEQ x 3 doses.  Hypomagnesium: - Magnesium 1.2 mg/dL, replaced with IV Mg 4 mg.  Iron Deficiency Anemia: - Iron saturation is 12%.  She would benefit from IV Venofer, but given difficulty with access, I will start Ferrous Sulfate 325 mg PO BID.  Nausea and Vomiting, resolved: - Zofran PRN. - Hold off on any further workup.  Patient can follow up with GI outpatient if she wishes.  Chronic Atrial Flutter S/P Ablation: - Start low dose Propafenone 150 mg BID given renal function and once potassium levels stabilize. - Liver  Profile is normal.  Essential Hypertension: - Amlodipine 5 mg daily.  Hyperlipidemia: - Statin.  Moderate Protein Calorie Malnutrition: - Albumin is 2.1 g/dL. - Recommend nutritional supplements.  Diet Order            DIET SOFT Room service appropriate? Yes; Fluid consistency: Thin  Diet effective now                  DVT prophylaxis: heparin injection 5,000 Units Start: 06/24/20 1000 Code Status:   Code Status: Prior  Family Communication: plan of care discussed with patient at bedside.  Status is: Inpatient  Remains inpatient appropriate because:Inpatient level of care appropriate due to severity of illness   Dispo: The patient is from: Home              Anticipated d/c is to: Home              Patient currently is medically stable to d/c.  Plan is to discharge patient hom tomorrow.  She can follow up with Nephrology to consider dialysis and GI if her nausea and vomiting recur.   Difficult to place patient No    Unresulted Labs (From admission, onward)          Start     Ordered   06/25/20 0500  Renal function panel  Daily,   R      06/24/20 0802   06/24/20 0824  Calcium, ionized  Add-on,   AD        06/24/20 [id number]  06/24/20 0803  Parathyroid hormone, intact (no Ca)  Once,   R        06/24/20 0802   Signed and Held  CBC  (heparin)  Once,   R       Comments: Baseline for heparin therapy IF NOT ALREADY DRAWN.  Notify MD if PLT < 100 K.    Signed and Held   Signed and Held  Creatinine, serum  (heparin)  Once,   R       Comments: Baseline for heparin therapy IF NOT ALREADY DRAWN.    Signed and Held   Signed and Held  CBC  Tomorrow morning,   R        Signed and Held   Signed and Held  Iron and TIBC  Once,   R        Signed and Held          Medications reviewed:  Scheduled Meds: . ferrous sulfate  325 mg Oral BID WC  . heparin injection (subcutaneous)  5,000 Units Subcutaneous Q12H  . propafenone  150 mg Oral Q12H   Continuous Infusions: .  magnesium sulfate bolus IVPB    . promethazine (PHENERGAN) injection (IM or IVPB) 12.5 mg (06/23/20 2301)  .  sodium bicarbonate (isotonic) infusion in sterile water 75 mL/hr at 06/24/20 0912    Consultants:see note  Procedures:see note  Antimicrobials: Anti-infectives (From admission, onward)   None      Objective: Vitals: Today's Vitals   06/24/20 0900 06/24/20 1155 06/24/20 1200 06/24/20 1300  BP:  133/67    Pulse:  86    Resp:  20    Temp:      TempSrc:      SpO2:  98%    PainSc: 0-No pain  0-No pain 0-No pain    Intake/Output Summary (Last 24 hours) at 06/24/2020 1434 Last data filed at 06/24/2020 0600 Gross per 24 hour  Intake 2700 ml  Output --  Net 2700 ml   There were no vitals filed for this visit. Weight change:   Intake/Output from previous day: 04/19 0701 - 04/20 0700 In: 3746 [I.V.:1650; IV Piggyback:2096] Out: -  Intake/Output this shift: No intake/output data recorded. There were no vitals filed for this visit.  Examination:  General exam: thin, frail, no acute distress, slightly anxious HEENT: NCAT, PERRL Respiratory system: CTAB, no WRR Cardiovascular system: did not appreciate a murmur, regular, No JVD. Gastrointestinal system: Abdomen soft, NT,ND, BS+. Nervous System: No focal deficits. Extremities: No edema, distal peripheral pulses palpable.  Skin: No rashes, mild scattered bruises, no icterus.  Data Reviewed: I have personally reviewed following labs and imaging studies CBC: Recent Labs  Lab 06/23/20 1623  WBC 8.5  NEUTROABS 4.6  HGB 8.9*  HCT 25.9*  MCV 97.0  PLT [id number]*   Basic Metabolic Panel: Recent Labs  Lab 06/23/20 1102 06/24/20 0420 06/24/20 1158  NA 136 140 141  K 3.5 2.7* 2.9*  CL 113* 112* 109  CO2 13* 20* 25  GLUCOSE 107* 89 84  BUN 43* 30* 29*  CREATININE 6.50* 5.20* 5.14*  CALCIUM 8.6* 7.5* 7.3*  MG  --  1.2*  --   PHOS  --  3.9  --    GFR: CrCl cannot be calculated (Unknown ideal weight.). Liver  Function Tests: Recent Labs  Lab 06/24/20 0420 06/24/20 1035  AST  --  15  ALT  --  14  ALKPHOS  --  85  BILITOT  --  0.4  PROT  --  5.6*  ALBUMIN 2.1* 2.3*   Anemia Panel: Recent Labs    06/24/20 1035  TIBC 162*  IRON 20*    Recent Results (from the past 240 hour(s))  SARS CORONAVIRUS 2 (TAT 6-24 HRS) Nasopharyngeal Nasopharyngeal Swab     Status: None   Collection Time: 06/23/20  4:30 PM   Specimen: Nasopharyngeal Swab  Result Value Ref Range Status   SARS Coronavirus 2 NEGATIVE NEGATIVE Final    Comment: (NOTE) SARS-CoV-2 target nucleic acids are NOT DETECTED.  The SARS-CoV-2 RNA is generally detectable in upper and lower respiratory specimens during the acute phase of infection. Negative results do not preclude SARS-CoV-2 infection, do not rule out co-infections with other pathogens, and should not be used as the sole basis for treatment or other patient management decisions. Negative results must be combined with clinical observations, patient history, and epidemiological information. The expected result is Negative.  Fact Sheet for Patients: SugarRoll.be  Fact Sheet for Healthcare Providers: https://www.woods-mathews.com/  This test is not yet approved or cleared by the [place] FDA and  has been authorized for detection and/or diagnosis of SARS-CoV-2 by FDA under an Emergency Use Authorization (EUA). This EUA will remain  in effect (meaning this test can be used) for the duration of the COVID-19 declaration under Se ction 564(b)(1) of the Act, 21 U.S.C. section 360bbb-3(b)(1), unless the authorization is terminated or revoked sooner.  Performed at [facility], [place] [address] [place], [place] [address]      Radiology Studies: US RENAL  Result Date: 06/23/2020 CLINICAL DATA:  Acute on chronic kidney injury. EXAM: RENAL / URINARY TRACT ULTRASOUND COMPLETE COMPARISON:  CT abdomen pelvis dated October 03, 2019. Renal ultrasound dated March 11, 2019. FINDINGS: Right Kidney: Renal measurements: 10.1 x 3.7 x 4.8 cm = volume: 93 mL. Unchanged increased echogenicity. No mass or hydronephrosis visualized. Left Kidney: Renal measurements: 9.9 x 4.7 x 3.8 cm = volume: 82 mL. Unchanged increased echogenicity. No mass or hydronephrosis visualized. Bladder: Appears normal for degree of bladder distention. Other: None. IMPRESSION: 1. No acute abnormality. 2. Unchanged increased bilateral renal echogenicity, consistent with medical renal disease. Electronically Signed   By: Titus Dubin M.D.   On: 06/23/2020 16:14   [place] EKG SITE RITE  Result Date: 06/24/2020 If Site Rite image not attached, placement could not be confirmed due to current cardiac rhythm.    LOS: 1 day   George Hugh, MD [facility]  06/24/2020, 2:34 PM

## 2020-06-24 NOTE — Progress Notes (Signed)
Secure chat sent to Dr Lavera Guise and Janace Hoard RN re PICC insertion recommendation.  Pt has infiltrate on RUA from NaHCO3, Mag and K and complaining of pain at site.  Prior LFA infiltrate also noted.  No suitable site for PIV at this time.  Gabby at bedside to remove PIV and apply heat/ice to site.

## 2020-06-24 NOTE — Progress Notes (Addendum)
[facility] ROUNDING NOTE   Subjective:   Interval history: 70 year old lady wide-complex tachycardia, SLE, stage IV chronic kidney disease.  She has been followed with Dr. Johnney Ou at [facility]  Her baseline creatinine appears to be around about 4 mg/dL.  She was admitted 06/23/2020 with 6-day history of refractory nausea with vomiting.  And dry heaves.  In the emergency room creatinine was 6.5 mg/dL.  She is being rehydrated with no gross uremic symptoms.  Blood pressure 150/80 pulse 83 temperature 98.4 O2 sats 97%  Sodium 140 potassium 2.7 chloride 112 CO2 20 BUN 30 creatinine 5.2 glucose 89 calcium 7.5 phosphorus 3.9 albumin 2.1 hemoglobin 8.9.  Urinalysis bland  Potassium chloride 10 mEq x 4 IV sodium bicarbonate 150 cc an hour  Renal ultrasound essentially unremarkable  Objective:  Vital signs in last 24 hours:  Temp:  [97.8 F (36.6 C)-98.4 F (36.9 C)] 98.4 F (36.9 C) (04/19 2035) Pulse Rate:  [77-102] 83 (04/19 2003) Resp:  [15-21] 17 (04/19 2003) BP: (107-159)/(59-140) 150/83 (04/19 2003) SpO2:  [90 %-100 %] 100 % (04/19 2003)  Weight change:  There were no vitals filed for this visit.  Intake/Output: I/O last 3 completed shifts: In: [id number] [I.V.:1650; IV Piggyback:2096] Out: -    Intake/Output this shift:  No intake/output data recorded.  CVS- RRR RS- CTA ABD- BS present soft non-distended EXT- no edema   Basic Metabolic Panel: Recent Labs  Lab 06/23/20 1102 06/24/20 0420  NA 136 140  K 3.5 2.7*  CL 113* 112*  CO2 13* 20*  GLUCOSE 107* 89  BUN 43* 30*  CREATININE 6.50* 5.20*  CALCIUM 8.6* 7.5*  PHOS  --  3.9    Liver Function Tests: Recent Labs  Lab 06/24/20 0420  ALBUMIN 2.1*   No results for input(s): LIPASE, AMYLASE in the last 168 hours. No results for input(s): AMMONIA in the last 168 hours.  CBC: Recent Labs  Lab 06/23/20 1623  WBC 8.5  NEUTROABS 4.6  HGB 8.9*  HCT 25.9*  MCV 97.0  PLT 521*     Cardiac Enzymes: No results for input(s): CKTOTAL, CKMB, CKMBINDEX, TROPONINI in the last 168 hours.  BNP: Invalid input(s): POCBNP  CBG: No results for input(s): GLUCAP in the last 168 hours.  Microbiology: Results for orders placed or performed during the hospital encounter of 06/23/20  SARS CORONAVIRUS 2 (TAT 6-24 HRS) Nasopharyngeal Nasopharyngeal Swab     Status: None   Collection Time: 06/23/20  4:30 PM   Specimen: Nasopharyngeal Swab  Result Value Ref Range Status   SARS Coronavirus 2 NEGATIVE NEGATIVE Final    Comment: (NOTE) SARS-CoV-2 target nucleic acids are NOT DETECTED.  The SARS-CoV-2 RNA is generally detectable in upper and lower respiratory specimens during the acute phase of infection. Negative results do not preclude SARS-CoV-2 infection, do not rule out co-infections with other pathogens, and should not be used as the sole basis for treatment or other patient management decisions. Negative results must be combined with clinical observations, patient history, and epidemiological information. The expected result is Negative.  Fact Sheet for Patients: SugarRoll.be  Fact Sheet for Healthcare Providers: https://www.woods-mathews.com/  This test is not yet approved or cleared by the [place] FDA and  has been authorized for detection and/or diagnosis of SARS-CoV-2 by FDA under an Emergency Use Authorization (EUA). This EUA will remain  in effect (meaning this test can be used) for the duration of the COVID-19 declaration under Se ction 564(b)(1) of the Act,  21 U.S.C. section 360bbb-3(b)(1), unless the authorization is terminated or revoked sooner.  Performed at [facility], [place] [address] [place], [place] [address]     Coagulation Studies: No results for input(s): LABPROT, INR in the last 72 hours.  Urinalysis: Recent Labs    06/23/20 1452  COLORURINE STRAW*  LABSPEC 1.004*  PHURINE 6.0   GLUCOSEU NEGATIVE  HGBUR SMALL*  BILIRUBINUR NEGATIVE  KETONESUR NEGATIVE  PROTEINUR 30*  NITRITE NEGATIVE  LEUKOCYTESUR NEGATIVE      Imaging: US RENAL  Result Date: 06/23/2020 CLINICAL DATA:  Acute on chronic kidney injury. EXAM: RENAL / URINARY TRACT ULTRASOUND COMPLETE COMPARISON:  CT abdomen pelvis dated October 03, 2019. Renal ultrasound dated March 11, 2019. FINDINGS: Right Kidney: Renal measurements: 10.1 x 3.7 x 4.8 cm = volume: 93 mL. Unchanged increased echogenicity. No mass or hydronephrosis visualized. Left Kidney: Renal measurements: 9.9 x 4.7 x 3.8 cm = volume: 82 mL. Unchanged increased echogenicity. No mass or hydronephrosis visualized. Bladder: Appears normal for degree of bladder distention. Other: None. IMPRESSION: 1. No acute abnormality. 2. Unchanged increased bilateral renal echogenicity, consistent with medical renal disease. Electronically Signed   By: Titus Dubin M.D.   On: 06/23/2020 16:14     Medications:   . potassium chloride    . promethazine (PHENERGAN) injection (IM or IVPB) 12.5 mg (06/23/20 2301)  .  sodium bicarbonate (isotonic) infusion in sterile water 150 mL/hr at 06/24/20 0245    promethazine (PHENERGAN) injection (IM or IVPB)  Assessment/ Plan:  1. AKI on CKD V -appears to be improving with hydration there is no signs of obstruction on her renal ultrasound.  Urinalysis is bland and unremarkable.  This is consistent with dehydration.    The does not appear to be any acute needs for dialysis.  We will continue hydration at this point.  It appears that she has had a transplant evaluation at [facility].  2. Hypokalemia replete as per primary service 3. Metabolic acidosis would recommend continuation of IV sodium bicarbonate at this point we will decrease the rate to 75 cc an hour 4. Bone mineral check daily renal panel and PTH 5. Anemia check iron studies we will start ESA if adequate 6. HTN - stable BP's here, cont home meds, avoid  acei/ arb w/ aki 7. HL - per pmd 8. H/o SLE 9. Hx arrhythmia - on propafenone     LOS: [place] '@TODAY''@7'$ :50 AM

## 2020-06-24 NOTE — Progress Notes (Signed)
Per Secure chat with Dr. Justin Mend, [place] Done, Avoid PICC, most likely need dialysis in the future. Primary RN made aware.

## 2020-06-24 NOTE — Progress Notes (Signed)
Pt has low k+ value of 2.7, Messaged on call provider and received order for [id number] po x1.

## 2020-06-24 NOTE — Plan of Care (Signed)

## 2020-06-24 NOTE — Progress Notes (Signed)
IV infiltrated, and removed. All medications are on hold until PICC is placed. Messaging doctor about oral potassium due to potassium level being 2.7

## 2020-06-25 ENCOUNTER — Inpatient Hospital Stay (HOSPITAL_COMMUNITY): Payer: Medicare Other

## 2020-06-25 DIAGNOSIS — E876 Hypokalemia: Secondary | ICD-10-CM

## 2020-06-25 DIAGNOSIS — E872 Acidosis: Secondary | ICD-10-CM

## 2020-06-25 LAB — CBC
HCT: 24.4 % — ABNORMAL LOW (ref 36.0–46.0)
Hemoglobin: 8.4 g/dL — ABNORMAL LOW (ref 12.0–15.0)
MCH: 33.2 pg (ref 26.0–34.0)
MCHC: 34.4 g/dL (ref 30.0–36.0)
MCV: 96.4 fL (ref 80.0–100.0)
Platelets: 596 10*3/uL — ABNORMAL HIGH (ref 150–400)
RBC: 2.53 MIL/uL — ABNORMAL LOW (ref 3.87–5.11)
RDW: 15.8 % — ABNORMAL HIGH (ref 11.5–15.5)
WBC: 12.2 10*3/uL — ABNORMAL HIGH (ref 4.0–10.5)
nRBC: 0.2 % (ref 0.0–0.2)

## 2020-06-25 LAB — BASIC METABOLIC PANEL
Anion gap: 6 (ref 5–15)
BUN: 33 mg/dL — ABNORMAL HIGH (ref 8–23)
CO2: 20 mmol/L — ABNORMAL LOW (ref 22–32)
Calcium: 7.8 mg/dL — ABNORMAL LOW (ref 8.9–10.3)
Chloride: 111 mmol/L (ref 98–111)
Creatinine, Ser: 4.82 mg/dL — ABNORMAL HIGH (ref 0.44–1.00)
GFR, Estimated: 9 mL/min — ABNORMAL LOW (ref >60)
Glucose, Bld: 80 mg/dL (ref 70–99)
Potassium: 3.3 mmol/L — ABNORMAL LOW (ref 3.5–5.1)
Sodium: 137 mmol/L (ref 135–145)

## 2020-06-25 LAB — CALCIUM, IONIZED: Calcium, Ionized, Serum: 4.4 mg/dL — ABNORMAL LOW (ref 4.5–5.6)

## 2020-06-25 LAB — PROCALCITONIN: Procalcitonin: 0.24 ng/mL

## 2020-06-25 LAB — MAGNESIUM: Magnesium: 1.2 mg/dL — ABNORMAL LOW (ref 1.7–2.4)

## 2020-06-25 LAB — PARATHYROID HORMONE, INTACT (NO CA): PTH: 431 pg/mL — ABNORMAL HIGH (ref 15–65)

## 2020-06-25 MED ORDER — FERROUS SULFATE 325 (65 FE) MG PO TABS: 325.0000 mg | ORAL_TABLET | Freq: Every day | ORAL | 0 refills | Status: AC

## 2020-06-25 MED ORDER — MAGNESIUM SULFATE 4 GM/100ML IV SOLN
4.0000 g | Freq: Once | INTRAVENOUS | Status: AC
  Administered 2020-06-25: 4 g via INTRAVENOUS
  Filled 2020-06-25: qty 100

## 2020-06-25 MED ORDER — SODIUM BICARBONATE 650 MG PO TABS
650.0000 mg | ORAL_TABLET | Freq: Three times a day (TID) | ORAL | Status: DC
  Administered 2020-06-25 – 2020-06-29 (×12): 650 mg via ORAL
  Filled 2020-06-25 (×12): qty 1

## 2020-06-25 MED ORDER — CALCITRIOL 0.25 MCG PO CAPS
0.2500 ug | ORAL_CAPSULE | Freq: Every day | ORAL | Status: DC
  Administered 2020-06-25 – 2020-07-01 (×7): 0.25 ug via ORAL
  Filled 2020-06-25 (×7): qty 1

## 2020-06-25 MED ORDER — PROMETHAZINE HCL 25 MG PO TABS
12.5000 mg | ORAL_TABLET | Freq: Four times a day (QID) | ORAL | Status: DC | PRN
  Administered 2020-06-25 – 2020-07-01 (×14): 12.5 mg via ORAL
  Filled 2020-06-25 (×11): qty 1

## 2020-06-25 MED ORDER — POTASSIUM CHLORIDE ER 20 MEQ PO TBCR: 20.0000 meq | EXTENDED_RELEASE_TABLET | Freq: Every day | ORAL | 0 refills | Status: AC

## 2020-06-25 MED ORDER — SODIUM BICARBONATE 650 MG PO TABS: 1300.0000 mg | ORAL_TABLET | Freq: Three times a day (TID) | ORAL | 0 refills | Status: AC

## 2020-06-25 MED ORDER — POTASSIUM CHLORIDE CRYS ER 20 MEQ PO TBCR
40.0000 meq | EXTENDED_RELEASE_TABLET | Freq: Once | ORAL | Status: AC
  Administered 2020-06-25: 40 meq via ORAL
  Filled 2020-06-25: qty 2

## 2020-06-25 MED ORDER — CALCITRIOL 0.25 MCG PO CAPS: 0.2500 ug | ORAL_CAPSULE | Freq: Every day | ORAL | 0 refills | Status: AC

## 2020-06-25 NOTE — Plan of Care (Signed)

## 2020-06-25 NOTE — Discharge Summary (Incomplete)
Discharge Summary  Debra Petersen [id number] DOB: 1950-11-29  PCP: Prince Solian, MD  Admit date: 06/23/2020 Discharge date: 06/25/2020  Time spent: 43mns, more than 50% time spent on coordination of care. 352ms  Recommendations for Outpatient Follow-up:  1. F/u with PCP within a week  for hospital discharge follow up, repeat cbc/bmp at follow up Unresulted Labs (From admission, onward)          Start     Ordered   06/24/20 0824  Calcium, ionized  Add-on,   AD        06/24/20 [id number] Unscheduled  Occult blood card to lab, stool  As needed,   R      06/25/20 1015   Signed and Held  CBC  (heparin)  Once,   R       Comments: Baseline for heparin therapy IF NOT ALREADY DRAWN.  Notify MD if PLT < 100 K.    Signed and Held   Signed and Held  Creatinine, serum  (heparin)  Once,   R       Comments: Baseline for heparin therapy IF NOT ALREADY DRAWN.    Signed and Held   Signed and Held  CBC  Tomorrow morning,   R        Signed and Held   Signed and Held  Iron and TIBC  Once,   R        Signed and Held        2.    Discharge Diagnoses:  Active Hospital Problems   Diagnosis Date Noted  . AKI (acute kidney injury) ([facility]04/19/2022    Resolved Hospital Problems  No resolved problems to display.    Discharge Condition: stable  Diet recommendation: heart healthy/carb modified  Filed Weights   06/25/20 0742  Weight: 51.9 kg    [facility] present illness:  [facility]  Active Problems:   AKI (acute kidney injury) ([facility]  Procedures:  *  Consultations:  *  Discharge Exam: BP 137/65 (BP Location: Right Arm)   Pulse 80   Temp 98.4 F (36.9 C) (Oral)   Resp 16   Wt 51.9 kg   SpO2 100%   BMI 20.27 kg/m   General: * Cardiovascular: * Respiratory: *  Discharge Instructions You were cared for by a hospitalist during your hospital stay. If you have any questions about your discharge medications or the care you received while you were in the  hospital after you are discharged, you can call the unit and asked to speak with the hospitalist on call if the hospitalist that took care of you is not available. Once you are discharged, your primary care physician will handle any further medical issues. Please note that NO REFILLS for any discharge medications will be authorized once you are discharged, as it is imperative that you return to your primary care physician (or establish a relationship with a primary care physician if you do not have one) for your aftercare needs so that they can reassess your need for medications and monitor your lab values.  Discharge Instructions    Diet general   Complete by: As directed    Renal us[place]orea Increase activity slowly   Complete by: As directed      Allergies as of 06/25/2020      Reactions   Codeine Itching      Medication List    TAKE these medications   acetaminophen 500 MG tablet Commonly known  as: TYLENOL Take 1,000 mg by mouth every 6 (six) hours as needed for mild pain.   amLODipine 5 MG tablet Commonly known as: NORVASC Take by mouth daily in the afternoon.   calcitRIOL 0.25 MCG capsule Commonly known as: ROCALTROL Take 1 capsule (0.25 mcg total) by mouth daily. Start taking on: June 26, 2020   ferrous sulfate 325 (65 FE) MG tablet Take 1 tablet (325 mg total) by mouth daily with breakfast.   Potassium Chloride ER 20 MEQ Tbcr Take 20 mEq by mouth daily for 3 days.   pravastatin 20 MG tablet Commonly known as: PRAVACHOL Take 20 mg by mouth daily.   promethazine 25 MG tablet Commonly known as: PHENERGAN Take 25 mg by mouth every 8 (eight) hours as needed for nausea.   propafenone 150 MG tablet Commonly known as: RYTHMOL TAKE 1 TABLET BY MOUTH TWICE DAILY. What changed: when to take this   sodium bicarbonate 650 MG tablet Take 2 tablets (1,300 mg total) by mouth 3 (three) times daily.      Allergies  Allergen Reactions  . Codeine Itching    Follow-up  Information    Avva, Ravisankar, MD Follow up in 1 week(s).   Specialty: Internal Medicine Why: for hospital discharge follow up, repeat basic labs to monitor anemia , kidneys function and electrolte levels Contact information: [place] [address] [phone]        Justin Mend, MD Follow up on 07/06/2020.   Specialty: Internal Medicine Contact information: [place] [id number] [phone]                The results of significant diagnostics from this hospitalization (including imaging, microbiology, ancillary and laboratory) are listed below for reference.    Significant Diagnostic Studies: US RENAL  Result Date: 06/23/2020 CLINICAL DATA:  Acute on chronic kidney injury. EXAM: RENAL / URINARY TRACT ULTRASOUND COMPLETE COMPARISON:  CT abdomen pelvis dated October 03, 2019. Renal ultrasound dated March 11, 2019. FINDINGS: Right Kidney: Renal measurements: 10.1 x 3.7 x 4.8 cm = volume: 93 mL. Unchanged increased echogenicity. No mass or hydronephrosis visualized. Left Kidney: Renal measurements: 9.9 x 4.7 x 3.8 cm = volume: 82 mL. Unchanged increased echogenicity. No mass or hydronephrosis visualized. Bladder: Appears normal for degree of bladder distention. Other: None. IMPRESSION: 1. No acute abnormality. 2. Unchanged increased bilateral renal echogenicity, consistent with medical renal disease. Electronically Signed   By: Titus Dubin M.D.   On: 06/23/2020 16:14   [place] EKG SITE RITE  Result Date: 06/24/2020 If Site Rite image not attached, placement could not be confirmed due to current cardiac rhythm.   Microbiology: Recent Results (from the past 240 hour(s))  SARS CORONAVIRUS 2 (TAT 6-24 HRS) Nasopharyngeal Nasopharyngeal Swab     Status: None   Collection Time: 06/23/20  4:30 PM   Specimen: Nasopharyngeal Swab  Result Value Ref Range Status   SARS Coronavirus 2 NEGATIVE NEGATIVE Final    Comment: (NOTE) SARS-CoV-2 target nucleic  acids are NOT DETECTED.  The SARS-CoV-2 RNA is generally detectable in upper and lower respiratory specimens during the acute phase of infection. Negative results do not preclude SARS-CoV-2 infection, do not rule out co-infections with other pathogens, and should not be used as the sole basis for treatment or other patient management decisions. Negative results must be combined with clinical observations, patient history, and epidemiological information. The expected result is Negative.  Fact Sheet for Patients: SugarRoll.be  Fact Sheet for Healthcare Providers:  https://www.woods-mathews.com/  This test is not yet approved or cleared by the [place] and  has been authorized for detection and/or diagnosis of SARS-CoV-2 by FDA under an Emergency Use Authorization (EUA). This EUA will remain  in effect (meaning this test can be used) for the duration of the COVID-19 declaration under Se ction 564(b)(1) of the Act, 21 U.S.C. section 360bbb-3(b)(1), unless the authorization is terminated or revoked sooner.  Performed at [facility], [place] [address] [place], [place] [address]      Labs: Basic Metabolic Panel: Recent Labs  Lab 06/23/20 1102 06/24/20 0420 06/24/20 1158 06/25/20 0122  NA 136 140 141 137  K 3.5 2.7* 2.9* 3.3*  CL 113* 112* 109 111  CO2 13* 20* 25 20*  GLUCOSE 107* 89 84 80  BUN 43* 30* 29* 33*  CREATININE 6.50* 5.20* 5.14* 4.82*  CALCIUM 8.6* 7.5* 7.3* 7.8*  MG  --  1.2*  --   --   PHOS  --  3.9  --   --    Liver Function Tests: Recent Labs  Lab 06/24/20 0420 06/24/20 1035  AST  --  15  ALT  --  14  ALKPHOS  --  85  BILITOT  --  0.4  PROT  --  5.6*  ALBUMIN 2.1* 2.3*   No results for input(s): LIPASE, AMYLASE in the last 168 hours. No results for input(s): AMMONIA in the last 168 hours. CBC: Recent Labs  Lab 06/23/20 1623 06/25/20 0122  WBC 8.5 12.2*  NEUTROABS 4.6  --   HGB 8.9* 8.4*   HCT 25.9* 24.4*  MCV 97.0 96.4  PLT 521* 596*   Cardiac Enzymes: No results for input(s): CKTOTAL, CKMB, CKMBINDEX, TROPONINI in the last 168 hours. BNP: BNP (last 3 results) No results for input(s): BNP in the last 8760 hours.  ProBNP (last 3 results) No results for input(s): PROBNP in the last 8760 hours.  CBG: No results for input(s): GLUCAP in the last 168 hours.     Signed:  Florencia Reasons MD, PhD, FACP  [facility] 06/25/2020, 10:38 AM

## 2020-06-25 NOTE — Progress Notes (Signed)
PROGRESS NOTE    Debra Petersen  [id number] DOB: Jun 30, 1950 DOA: 06/23/2020 PCP: Prince Solian, MD   No chief complaint on file.   Brief Narrative: 70 year old female with PMH of Recurrent Atrial Flutter s/p ablation on Propafenone and Chronic Kidney Disease Stage 4 who follows with Dr. Johnney Ou presents to the ED on 4/19 with Creatinine of 6.5 mg/dL with baseline ~ 4 mg/dL.  Patient states she recently recovered from an acute virus that lasted a week where she had nausea and vomiting and dry heaves.  She was managed with Sodium Bicarbonate Drip overnight.  This morning her IV infiltrated and this was stopped.  Subjective:  Reports having cough, chills Denies any abdominal pain, nausea, vomiting, or diarrhea.   she lost her iv access, she said, she started to eat more, wbc elevated today, increase from 8.5 to 12.2  Assessment & Plan: Active Problems:   AKI (acute kidney injury) ([place])  Cough with leukocytosis and reports chills,  Denies seasonal allergies, denies nasal congestion, denies acid reflux Will get cxr, procalcitonin    AKI on CKD4/metabolic acidosis: -Bicarb 13 /creatinine 6.5 on presentation,   -Has been on bicarbonate drip until Lost IV access, report started to eat better now, start oral bicarb supplement -Appreciate nephrology input -  She will follow up with her regular Nephrologist to discuss options of peritoneal dialysis vs hemodialysis.  Report also followed at [facility] for kidney [facility] evaluation  Hypokalemia: - Potassium is 2.7 mmol/L. -Remain low after several dose of potassium supplement , continue replace , recheck in the morning   Hypomagnesium: - Magnesium 1.2 mg/dL on presentation - IV Mg 4 mg ordered, not sure if she has got it, repeat mag remain low at 1.2, will order iv mag 4g x1 today. -Recheck magnesium level  Iron Deficiency Anemia: -Report got IV iron in the past - Iron saturation is 12%.   -started on Ferrous Sulfate  325 mg PO BID, consider IV iron if she have IV access  Nausea and Vomiting, resolved: - Zofran PRN. - Hold off on any further workup.  Patient can follow up with GI outpatient if she wishes.  Chronic Atrial Flutter S/P Ablation: - Start low dose Propafenone 150 mg BID given renal function and once potassium levels stabilize. - Liver Profile is normal.  Essential Hypertension: - Amlodipine 5 mg daily.  Hyperlipidemia: - Statin.  Moderate Protein Calorie Malnutrition: - Albumin is 2.1 g/dL. - Recommend nutritional supplements.  Diet Order            Diet general           DIET SOFT Room service appropriate? Yes; Fluid consistency: Thin  Diet effective now                  DVT prophylaxis: heparin injection 5,000 Units Start: 06/24/20 1000 Code Status:   Code Status: Prior  Family Communication: plan of care discussed with patient at bedside.  Status is: Inpatient  Remains inpatient appropriate because:Inpatient level of care appropriate due to severity of illness   Dispo: The patient is from: Home              Anticipated d/c is to: Home              Patient currently is medically stable to d/c.  Plan is to likely discharge patient home tomorrow.  She can follow up with Nephrology to consider dialysis and GI if her nausea and vomiting recur.  Difficult to place patient No    Unresulted Labs (From admission, onward)          Start     Ordered   06/26/20 0500  CBC with Differential/Platelet  Tomorrow morning,   R       Question:  Specimen collection method  Answer:  Lab=Lab collect   06/25/20 1504   06/26/20 [id number]  Basic metabolic panel  Tomorrow morning,   R       Question:  Specimen collection method  Answer:  Lab=Lab collect   06/25/20 1504   06/26/20 0500  Magnesium  Tomorrow morning,   R       Question:  Specimen collection method  Answer:  Lab=Lab collect   06/25/20 1504   Unscheduled  Occult blood card to lab, stool  As needed,   R      06/25/20 1015    Signed and Held  CBC  (heparin)  Once,   R       Comments: Baseline for heparin therapy IF NOT ALREADY DRAWN.  Notify MD if PLT < 100 K.    Signed and Held   Signed and Held  Creatinine, serum  (heparin)  Once,   R       Comments: Baseline for heparin therapy IF NOT ALREADY DRAWN.    Signed and Held   Signed and Held  CBC  Tomorrow morning,   R        Signed and Held   Signed and Held  Iron and TIBC  Once,   R        Signed and Held          Medications reviewed:  Scheduled Meds: . amLODipine  5 mg Oral Daily  . calcitRIOL  0.25 mcg Oral Daily  . ferrous sulfate  325 mg Oral BID WC  . heparin injection (subcutaneous)  5,000 Units Subcutaneous Q12H  . pravastatin  20 mg Oral q1800  . propafenone  150 mg Oral Q12H   Continuous Infusions: . magnesium sulfate bolus IVPB    .  sodium bicarbonate (isotonic) infusion in sterile water 75 mL/hr at 06/24/20 0912    Consultants:see note  Procedures:see note  Antimicrobials: Anti-infectives (From admission, onward)   None      Objective: Vitals: Today's Vitals   06/25/20 0742 06/25/20 0915 06/25/20 1148 06/25/20 1152  BP: 137/65  (!) 131/49 135/61  Pulse: 80  87   Resp: 16  20   Temp: 98.4 F (36.9 C)  98.6 F (37 C)   TempSrc: Oral  Oral   SpO2: 100%  100%   Weight: 51.9 kg     PainSc:  0-No pain     No intake or output data in the 24 hours ending 06/25/20 1504 Filed Weights   06/25/20 0742  Weight: 51.9 kg   Weight change:   Intake/Output from previous day: 04/20 0701 - 04/21 0700 In: 978.3 [P.O.:500; I.V.:478.3] Out: -  Intake/Output this shift: No intake/output data recorded. Filed Weights   06/25/20 0742  Weight: 51.9 kg    Examination:  General exam: thin, frail, no acute distress, slightly anxious HEENT: NCAT, PERRL Respiratory system: Bibasilar crackles, no wheezing, no rales, no rhonchi, , no increased work of breathing Cardiovascular system: did not appreciate a murmur, regular, No  JVD. Gastrointestinal system: Abdomen soft, NT,ND, BS+. Nervous System: No focal deficits. Extremities: No edema, distal peripheral pulses palpable.  Skin: No rashes, mild scattered bruises, no icterus.  Data Reviewed: I  have personally reviewed following labs and imaging studies CBC: Recent Labs  Lab 06/23/20 1623 06/25/20 0122  WBC 8.5 12.2*  NEUTROABS 4.6  --   HGB 8.9* 8.4*  HCT 25.9* 24.4*  MCV 97.0 96.4  PLT 521* 0000000*   Basic Metabolic Panel: Recent Labs  Lab 06/23/20 1102 06/24/20 0420 06/24/20 1158 06/25/20 0122  NA 136 140 141 137  K 3.5 2.7* 2.9* 3.3*  CL 113* 112* 109 111  CO2 13* 20* 25 20*  GLUCOSE 107* 89 84 80  BUN 43* 30* 29* 33*  CREATININE 6.50* 5.20* 5.14* 4.82*  CALCIUM 8.6* 7.5* 7.3* 7.8*  MG  --  1.2*  --   --   PHOS  --  3.9  --   --    GFR: Estimated Creatinine Clearance: 9 mL/min (A) (by C-G formula based on SCr of 4.82 mg/dL (H)). Liver Function Tests: Recent Labs  Lab 06/24/20 0420 06/24/20 1035  AST  --  15  ALT  --  14  ALKPHOS  --  85  BILITOT  --  0.4  PROT  --  5.6*  ALBUMIN 2.1* 2.3*   Anemia Panel: Recent Labs    06/24/20 1035  TIBC 162*  IRON 20*    Recent Results (from the past 240 hour(s))  SARS CORONAVIRUS 2 (TAT 6-24 HRS) Nasopharyngeal Nasopharyngeal Swab     Status: None   Collection Time: 06/23/20  4:30 PM   Specimen: Nasopharyngeal Swab  Result Value Ref Range Status   SARS Coronavirus 2 NEGATIVE NEGATIVE Final    Comment: (NOTE) SARS-CoV-2 target nucleic acids are NOT DETECTED.  The SARS-CoV-2 RNA is generally detectable in upper and lower respiratory specimens during the acute phase of infection. Negative results do not preclude SARS-CoV-2 infection, do not rule out co-infections with other pathogens, and should not be used as the sole basis for treatment or other patient management decisions. Negative results must be combined with clinical observations, patient history, and epidemiological  information. The expected result is Negative.  Fact Sheet for Patients: SugarRoll.be  Fact Sheet for Healthcare Providers: https://www.woods-mathews.com/  This test is not yet approved or cleared by the [place] FDA and  has been authorized for detection and/or diagnosis of SARS-CoV-2 by FDA under an Emergency Use Authorization (EUA). This EUA will remain  in effect (meaning this test can be used) for the duration of the COVID-19 declaration under Se ction 564(b)(1) of the Act, 21 U.S.C. section 360bbb-3(b)(1), unless the authorization is terminated or revoked sooner.  Performed at [facility], [place] [address], [place], [place] [address]      Radiology Studies: DG Chest 2 View  Result Date: 06/25/2020 CLINICAL DATA:  Cough. EXAM: CHEST - 2 VIEW COMPARISON:  Chest x-ray 10/03/2019. FINDINGS: Mediastinum hilar structures normal. Cardiomegaly. Mild right base atelectasis/infiltrate cannot be excluded. No pleural effusion or pneumothorax. Degenerative change thoracic spine. Surgical clips upper abdomen. IMPRESSION: 1.  Cardiomegaly.  No pulmonary venous congestion. 2.  Mild right base atelectasis/infiltrate cannot be excluded. Electronically Signed   By: Marcello Moores  Register   On: 06/25/2020 12:54   US RENAL  Result Date: 06/23/2020 CLINICAL DATA:  Acute on chronic kidney injury. EXAM: RENAL / URINARY TRACT ULTRASOUND COMPLETE COMPARISON:  CT abdomen pelvis dated October 03, 2019. Renal ultrasound dated March 11, 2019. FINDINGS: Right Kidney: Renal measurements: 10.1 x 3.7 x 4.8 cm = volume: 93 mL. Unchanged increased echogenicity. No mass or hydronephrosis visualized. Left Kidney: Renal measurements: 9.9 x 4.7  x 3.8 cm = volume: 82 mL. Unchanged increased echogenicity. No mass or hydronephrosis visualized. Bladder: Appears normal for degree of bladder distention. Other: None. IMPRESSION: 1. No acute abnormality. 2. Unchanged increased bilateral  renal echogenicity, consistent with medical renal disease. Electronically Signed   By: Titus Dubin M.D.   On: 06/23/2020 16:14   [place] EKG SITE RITE  Result Date: 06/24/2020 If Site Rite image not attached, placement could not be confirmed due to current cardiac rhythm.    LOS: 2 days   Florencia Reasons, MD PhD FACP [facility]  06/25/2020, 3:04 PM

## 2020-06-25 NOTE — Progress Notes (Signed)
[facility] ROUNDING NOTE   Subjective:   Interval history: 70 year old lady wide-complex tachycardia, SLE, stage IV chronic kidney disease.  She has been followed with Dr. Johnney Ou at [facility].  Her baseline creatinine appears to be around about 4 mg/dL.  She was admitted 06/23/2020 with 6-day history of refractory nausea with vomiting.  And dry heaves.  In the emergency room creatinine was 6.5 mg/dL.  She is being rehydrated with no gross uremic symptoms.  IV infiltrated unable to receive intravenous medications.  Blood pressure 137/65 pulse 88 temperature 98.4 O2 sats 99% room air  Sodium 137 potassium 3.3 chloride 111 CO2 20 BUN 33 creatinine 4.82 glucose 80 calcium 7.8 iron saturation 12%.  Hemoglobin 8.4   Renal ultrasound essentially unremarkable  Objective:  Vital signs in last 24 hours:  Temp:  [98.4 F (36.9 C)-99.8 F (37.7 C)] 98.4 F (36.9 C) (04/21 0742) Pulse Rate:  [73-88] 80 (04/21 0742) Resp:  [16-20] 16 (04/21 0742) BP: (133-158)/(58-84) 137/65 (04/21 0742) SpO2:  [94 %-100 %] 100 % (04/21 0742) Weight:  [51.9 kg] 51.9 kg (04/21 0742)  Weight change:  Filed Weights   06/25/20 0742  Weight: 51.9 kg    Intake/Output: I/O last 3 completed shifts: In: 2678.3 [P.O.:500; I.V.:2128.3; IV Piggyback:50] Out: -    Intake/Output this shift:  No intake/output data recorded.  Alert nondistressed  CVS- RRR no murmurs rubs gallops RS- CTA no wheezes or rales ABD- BS present soft non-distended EXT- no edema   Basic Metabolic Panel: Recent Labs  Lab 06/23/20 1102 06/24/20 0420 06/24/20 1158 06/25/20 0122  NA 136 140 141 137  K 3.5 2.7* 2.9* 3.3*  CL 113* 112* 109 111  CO2 13* 20* 25 20*  GLUCOSE 107* 89 84 80  BUN 43* 30* 29* 33*  CREATININE 6.50* 5.20* 5.14* 4.82*  CALCIUM 8.6* 7.5* 7.3* 7.8*  MG  --  1.2*  --   --   PHOS  --  3.9  --   --     Liver Function Tests: Recent Labs  Lab 06/24/20 0420 06/24/20 1035   AST  --  15  ALT  --  14  ALKPHOS  --  85  BILITOT  --  0.4  PROT  --  5.6*  ALBUMIN 2.1* 2.3*   No results for input(s): LIPASE, AMYLASE in the last 168 hours. No results for input(s): AMMONIA in the last 168 hours.  CBC: Recent Labs  Lab 06/23/20 1623 06/25/20 0122  WBC 8.5 12.2*  NEUTROABS 4.6  --   HGB 8.9* 8.4*  HCT 25.9* 24.4*  MCV 97.0 96.4  PLT 521* 596*    Cardiac Enzymes: No results for input(s): CKTOTAL, CKMB, CKMBINDEX, TROPONINI in the last 168 hours.  BNP: Invalid input(s): POCBNP  CBG: No results for input(s): GLUCAP in the last 168 hours.  Microbiology: Results for orders placed or performed during the hospital encounter of 06/23/20  SARS CORONAVIRUS 2 (TAT 6-24 HRS) Nasopharyngeal Nasopharyngeal Swab     Status: None   Collection Time: 06/23/20  4:30 PM   Specimen: Nasopharyngeal Swab  Result Value Ref Range Status   SARS Coronavirus 2 NEGATIVE NEGATIVE Final    Comment: (NOTE) SARS-CoV-2 target nucleic acids are NOT DETECTED.  The SARS-CoV-2 RNA is generally detectable in upper and lower respiratory specimens during the acute phase of infection. Negative results do not preclude SARS-CoV-2 infection, do not rule out co-infections with other pathogens, and should not be used as the sole  basis for treatment or other patient management decisions. Negative results must be combined with clinical observations, patient history, and epidemiological information. The expected result is Negative.  Fact Sheet for Patients: SugarRoll.be  Fact Sheet for Healthcare Providers: https://www.woods-mathews.com/  This test is not yet approved or cleared by the [place] FDA and  has been authorized for detection and/or diagnosis of SARS-CoV-2 by FDA under an Emergency Use Authorization (EUA). This EUA will remain  in effect (meaning this test can be used) for the duration of the COVID-19 declaration under Se ction  564(b)(1) of the Act, 21 U.S.C. section 360bbb-3(b)(1), unless the authorization is terminated or revoked sooner.  Performed at [facility], [place] [address] [place], [place] [address]     Coagulation Studies: No results for input(s): LABPROT, INR in the last 72 hours.  Urinalysis: Recent Labs    06/23/20 1452  COLORURINE STRAW*  LABSPEC 1.004*  PHURINE 6.0  GLUCOSEU NEGATIVE  HGBUR SMALL*  BILIRUBINUR NEGATIVE  KETONESUR NEGATIVE  PROTEINUR 30*  NITRITE NEGATIVE  LEUKOCYTESUR NEGATIVE      Imaging: US RENAL  Result Date: 06/23/2020 CLINICAL DATA:  Acute on chronic kidney injury. EXAM: RENAL / URINARY TRACT ULTRASOUND COMPLETE COMPARISON:  CT abdomen pelvis dated October 03, 2019. Renal ultrasound dated March 11, 2019. FINDINGS: Right Kidney: Renal measurements: 10.1 x 3.7 x 4.8 cm = volume: 93 mL. Unchanged increased echogenicity. No mass or hydronephrosis visualized. Left Kidney: Renal measurements: 9.9 x 4.7 x 3.8 cm = volume: 82 mL. Unchanged increased echogenicity. No mass or hydronephrosis visualized. Bladder: Appears normal for degree of bladder distention. Other: None. IMPRESSION: 1. No acute abnormality. 2. Unchanged increased bilateral renal echogenicity, consistent with medical renal disease. Electronically Signed   By: Titus Dubin M.D.   On: 06/23/2020 16:14   [place] EKG SITE RITE  Result Date: 06/24/2020 If Site Rite image not attached, placement could not be confirmed due to current cardiac rhythm.    Medications:   . magnesium sulfate bolus IVPB    .  sodium bicarbonate (isotonic) infusion in sterile water 75 mL/hr at 06/24/20 0912   . amLODipine  5 mg Oral Daily  . ferrous sulfate  325 mg Oral BID WC  . heparin injection (subcutaneous)  5,000 Units Subcutaneous Q12H  . potassium chloride  40 mEq Oral Once  . pravastatin  20 mg Oral q1800  . propafenone  150 mg Oral Q12H   promethazine  Assessment/ Plan:  1. AKI on CKD V -appears to be  improving with hydration there is no signs of obstruction on her renal ultrasound.  Urinalysis is bland and unremarkable.  This is consistent with dehydration.    The does not appear to be any acute needs for dialysis.  We will continue hydration at this point.  It appears that she has had a transplant evaluation at [facility].  She would also like to consider peritoneal dialysis when the time comes.  She will need PD catheter placement creatinine above 6 mg/dL. 2. Hypokalemia replete as per primary service 3. Metabolic acidosis would recommend continuation of IV sodium bicarbonate at this point we will decrease the rate to 75 cc an hour.  Continues to be repleted.  4. Bone mineral check daily renal panel.  PTH 431 we will start calcitriol 0.25 mcg. 5. Anemia patient is iron deficient cannot receive any IV iron to replete.  Being given oral ferrous sulfate at this time.  Would not recommend any ESA's until iron  stores are replete 6. HTN - stable BP's here, cont home meds, avoid acei/ arb w/ aki 7. HL - per pmd 8. H/o SLE 9. Hx arrhythmia - on propafenone  At this point there is little more to add will sign off patient.  She can follow-up with Dr. Johnney Ou at [facility] on discharge     LOS: [place] '@TODAY''@8'$ :38 AM

## 2020-06-26 LAB — BASIC METABOLIC PANEL
Anion gap: 7 (ref 5–15)
BUN: 33 mg/dL — ABNORMAL HIGH (ref 8–23)
CO2: 21 mmol/L — ABNORMAL LOW (ref 22–32)
Calcium: 8.8 mg/dL — ABNORMAL LOW (ref 8.9–10.3)
Chloride: 108 mmol/L (ref 98–111)
Creatinine, Ser: 4.14 mg/dL — ABNORMAL HIGH (ref 0.44–1.00)
GFR, Estimated: 11 mL/min — ABNORMAL LOW (ref >60)
Glucose, Bld: 95 mg/dL (ref 70–99)
Potassium: 5.3 mmol/L — ABNORMAL HIGH (ref 3.5–5.1)
Sodium: 136 mmol/L (ref 135–145)

## 2020-06-26 LAB — RESPIRATORY PANEL BY PCR

## 2020-06-26 LAB — CBC WITH DIFFERENTIAL/PLATELET
Abs Immature Granulocytes: 0.18 10*3/uL — ABNORMAL HIGH (ref 0.00–0.07)
Basophils Absolute: 0.1 10*3/uL (ref 0.0–0.1)
Basophils Relative: 1 %
Eosinophils Absolute: 0.7 10*3/uL — ABNORMAL HIGH (ref 0.0–0.5)
Eosinophils Relative: 5 %
HCT: 26.2 % — ABNORMAL LOW (ref 36.0–46.0)
Hemoglobin: 8.8 g/dL — ABNORMAL LOW (ref 12.0–15.0)
Immature Granulocytes: 1 %
Lymphocytes Relative: 36 %
Lymphs Abs: 4.9 10*3/uL — ABNORMAL HIGH (ref 0.7–4.0)
MCH: 33.2 pg (ref 26.0–34.0)
MCHC: 33.6 g/dL (ref 30.0–36.0)
MCV: 98.9 fL (ref 80.0–100.0)
Monocytes Absolute: 1.8 10*3/uL — ABNORMAL HIGH (ref 0.1–1.0)
Monocytes Relative: 13 %
Neutro Abs: 5.9 10*3/uL (ref 1.7–7.7)
Neutrophils Relative %: 44 %
Platelets: 625 10*3/uL — ABNORMAL HIGH (ref 150–400)
RBC: 2.65 MIL/uL — ABNORMAL LOW (ref 3.87–5.11)
RDW: 16.1 % — ABNORMAL HIGH (ref 11.5–15.5)
WBC: 13.7 10*3/uL — ABNORMAL HIGH (ref 4.0–10.5)
nRBC: 0.1 % (ref 0.0–0.2)

## 2020-06-26 LAB — PROCALCITONIN: Procalcitonin: 0.24 ng/mL

## 2020-06-26 LAB — MAGNESIUM: Magnesium: 2.8 mg/dL — ABNORMAL HIGH (ref 1.7–2.4)

## 2020-06-26 MED ORDER — CHOLESTYRAMINE LIGHT 4 G PO PACK
4.0000 g | PACK | Freq: Every day | ORAL | Status: DC
  Administered 2020-06-26 – 2020-07-01 (×6): 4 g via ORAL
  Filled 2020-06-26 (×6): qty 1

## 2020-06-26 MED ORDER — GUAIFENESIN ER 600 MG PO TB12
600.0000 mg | ORAL_TABLET | Freq: Two times a day (BID) | ORAL | Status: DC
  Administered 2020-06-26 – 2020-07-01 (×10): 600 mg via ORAL
  Filled 2020-06-26 (×10): qty 1

## 2020-06-26 NOTE — Plan of Care (Signed)

## 2020-06-26 NOTE — Care Management Important Message (Signed)
Important Message  Patient Details  Name: Debra Petersen MRN: [id number] Date of Birth: 27-Feb-1951   Medicare Important Message Given:  Yes     Abisola Carrero 06/26/2020, 2:05 PM

## 2020-06-26 NOTE — Progress Notes (Signed)
PROGRESS NOTE    Debra Petersen  [id number] DOB: [birth year] DOA: 06/23/2020 PCP: Prince Solian, MD   No chief complaint on file.   Brief Narrative: 70 year old female with PMH of Recurrent Atrial Flutter s/p ablation on Propafenone and Chronic Kidney Disease Stage 4 who follows with Dr. Johnney Ou presents to the ED on 4/19 with Creatinine of 6.5 mg/dL with baseline ~ 4 mg/dL.  Patient states she recently recovered from an acute virus that lasted a week where she had nausea and vomiting and dry heaves.  She was managed with Sodium Bicarbonate Drip overnight.  This morning her IV infiltrated and this was stopped.  Subjective:  Reports has been sick having cough since 4/8 ,  Coworker has similar symotoms She is gradually improving, less cough compare to before, no fever last 24hrs, wbc though continue to trend up She reports chronic diarrhea, reports cholestyramine helped in the past  Cr improving, appetite is better  son at bedside    Assessment & Plan: Active Problems:   AKI (acute kidney injury) ([place])  Cough with leukocytosis and reports chills,  Denies seasonal allergies, denies nasal congestion, denies acid reflux  cxr mild r basilar atelectasis vs infiltrate, procalcitonin 0.24 She has no fever, reports although cough is still lingering but has much improved compared to before, will add on blood culture, respiratory viral panel, she is tested negative for covid 19, currently she does not appear septic,   Leukocytosis/thrombocytosis Reactive? Dehydration? Got worse Follow up on blood culture, respiratory viral panel, lactic acid   encourage oral intake, repeat cbc in am    AKI on CKD4/metabolic acidosis: ( reason for admission) -Bicarb 13 /creatinine 6.5 on presentation, cr improving  - on bicarbonate drip initially , report started to eat better now, start oral bicarb supplement -Appreciate nephrology input -  She will follow up with her regular Nephrologist to discuss  options of peritoneal dialysis vs hemodialysis.  Report also followed at [facility] for kidney transplant evaluation  Hypokalemia: - Potassium is 2.7 mmol/L. -replaced, improved, recheck in the morning   Hypomagnesium: - Magnesium 1.2 mg/dL on presentation -  iv mag 4g x1 on 4/21 -Recheck magnesium level in am  Iron Deficiency Anemia: -Report got IV iron in the past - Iron saturation is 12%.   -started on Ferrous Sulfate 325 mg PO BID, consider IV iron if rule out infection  Nausea and Vomiting, resolved: -lft wnl - Zofran PRN. - Hold off on any further workup.  Patient can follow up with GI outpatient if she wishes.  Chronic diarrhea: Reports cholestyramine helped in the past but cause constipation, will try cholestyramine once daily for now  Chronic Atrial Flutter S/P Ablation: - continue home meds Propafenone 150 mg BID   Essential Hypertension: - Amlodipine 5 mg daily.  Hyperlipidemia: - Statin.  Moderate Protein Calorie Malnutrition: - Albumin is 2.1 g/dL. - Recommend nutritional supplements.  Diet Order            Diet general           DIET SOFT Room service appropriate? Yes; Fluid consistency: Thin  Diet effective now                  DVT prophylaxis: heparin injection 5,000 Units Start: 06/24/20 1000 Code Status:   Code Status: Prior  Family Communication: plan of care discussed with patient and son at bedside.  Status is: Inpatient  Remains inpatient appropriate because:Inpatient level of care appropriate due to severity  of illness   Dispo: The patient is from: Home              Anticipated d/c is to: Home              Patient currently is not medically stable to d/c.  Possible discharge patient home tomorrow if clinically continue to improve and wbc/plt improves, f/u blood culture, respiratory viral panel, lactic acid   She can follow up with Nephrology to consider dialysis and GI if her nausea and vomiting recur.   Difficult to place  patient No    Unresulted Labs (From admission, onward)          Start     Ordered   06/27/20 0500  CBC with Differential/Platelet  Tomorrow morning,   R       Question:  Specimen collection method  Answer:  Lab=Lab collect   06/26/20 1507   06/27/20 [id number]  Basic metabolic panel  Tomorrow morning,   R       Question:  Specimen collection method  Answer:  Lab=Lab collect   06/26/20 1507   06/26/20 1507  Respiratory (~20 pathogens) panel by PCR  (Respiratory panel by PCR (~20 pathogens, ~24 hr TAT)  w precautions)  Once,   R        06/26/20 1506   06/26/20 1504  Culture, blood (routine x 2)  BLOOD CULTURE X 2,   R (with TIMED occurrences)      06/26/20 1503   06/26/20 0500  Procalcitonin  Daily,   R     Question:  Specimen collection method  Answer:  Lab=Lab collect   06/25/20 1513   Unscheduled  Occult blood card to lab, stool  As needed,   R      06/25/20 1015   Signed and Held  CBC  (heparin)  Once,   R       Comments: Baseline for heparin therapy IF NOT ALREADY DRAWN.  Notify MD if PLT < 100 K.    Signed and Held   Signed and Held  Creatinine, serum  (heparin)  Once,   R       Comments: Baseline for heparin therapy IF NOT ALREADY DRAWN.    Signed and Held   Signed and Held  CBC  Tomorrow morning,   R        Signed and Held   Signed and Held  Iron and TIBC  Once,   R        Signed and Held          Medications reviewed:  Scheduled Meds: . amLODipine  5 mg Oral Daily  . calcitRIOL  0.25 mcg Oral Daily  . cholestyramine light  4 g Oral Daily  . ferrous sulfate  325 mg Oral BID WC  . guaiFENesin  600 mg Oral BID  . heparin injection (subcutaneous)  5,000 Units Subcutaneous Q12H  . pravastatin  20 mg Oral q1800  . propafenone  150 mg Oral Q12H  . sodium bicarbonate  650 mg Oral TID   Continuous Infusions:   Consultants:see note  Procedures:see note  Antimicrobials: Anti-infectives (From admission, onward)   None      Objective: Vitals: Today's Vitals    06/26/20 0504 06/26/20 0723 06/26/20 0841 06/26/20 1124  BP: (!) 145/89 (!) 145/79  (!) 150/67  Pulse: 90 86  86  Resp: '20 16  20  '$ Temp: 98.3 F (36.8 C) 98 F (36.7 C)  98.3 F (36.8 C)  TempSrc: Oral Oral  Oral  SpO2: 98% 98%    Weight:      PainSc:   0-No pain     Intake/Output Summary (Last 24 hours) at 06/26/2020 1521 Last data filed at 06/26/2020 1027 Gross per 24 hour  Intake 2160 ml  Output 550 ml  Net 1610 ml   Filed Weights   06/25/20 0742  Weight: 51.9 kg   Weight change:   Intake/Output from previous day: 04/21 0701 - 04/22 0700 In: 1320 [P.O.:1320] Out: 550 [Urine:550] Intake/Output this shift: Total I/O In: 840 [P.O.:840] Out: -  Filed Weights   06/25/20 0742  Weight: 51.9 kg    Examination:  General exam: thin, frail, no acute distress, slightly anxious HEENT: NCAT, PERRL Respiratory system: mild Bibasilar crackles, no wheezing, no rales, no rhonchi, , no increased work of breathing Cardiovascular system: did not appreciate a murmur, regular, No JVD. Gastrointestinal system: Abdomen soft, NT,ND, BS+. Nervous System: No focal deficits. Extremities: No edema, distal peripheral pulses palpable.  Skin: No rashes, mild scattered bruises, no icterus.  Data Reviewed: I have personally reviewed following labs and imaging studies CBC: Recent Labs  Lab 06/23/20 1623 06/25/20 0122 06/26/20 0136  WBC 8.5 12.2* 13.7*  NEUTROABS 4.6  --  5.9  HGB 8.9* 8.4* 8.8*  HCT 25.9* 24.4* 26.2*  MCV 97.0 96.4 98.9  PLT 521* 596* [id number]*   Basic Metabolic Panel: Recent Labs  Lab 06/23/20 1102 06/24/20 0420 06/24/20 1158 06/25/20 0122 06/25/20 1544 06/26/20 0136  NA 136 140 141 137  --  136  K 3.5 2.7* 2.9* 3.3*  --  5.3*  CL 113* 112* 109 111  --  108  CO2 13* 20* 25 20*  --  21*  GLUCOSE 107* 89 84 80  --  95  BUN 43* 30* 29* 33*  --  33*  CREATININE 6.50* 5.20* 5.14* 4.82*  --  4.14*  CALCIUM 8.6* 7.5* 7.3* 7.8*  --  8.8*  MG  --  1.2*  --   --  1.2*  2.8*  PHOS  --  3.9  --   --   --   --    GFR: Estimated Creatinine Clearance: 10.5 mL/min (A) (by C-G formula based on SCr of 4.14 mg/dL (H)). Liver Function Tests: Recent Labs  Lab 06/24/20 0420 06/24/20 1035  AST  --  15  ALT  --  14  ALKPHOS  --  85  BILITOT  --  0.4  PROT  --  5.6*  ALBUMIN 2.1* 2.3*   Anemia Panel: Recent Labs    06/24/20 1035  TIBC 162*  IRON 20*    Recent Results (from the past 240 hour(s))  SARS CORONAVIRUS 2 (TAT 6-24 HRS) Nasopharyngeal Nasopharyngeal Swab     Status: None   Collection Time: 06/23/20  4:30 PM   Specimen: Nasopharyngeal Swab  Result Value Ref Range Status   SARS Coronavirus 2 NEGATIVE NEGATIVE Final    Comment: (NOTE) SARS-CoV-2 target nucleic acids are NOT DETECTED.  The SARS-CoV-2 RNA is generally detectable in upper and lower respiratory specimens during the acute phase of infection. Negative results do not preclude SARS-CoV-2 infection, do not rule out co-infections with other pathogens, and should not be used as the sole basis for treatment or other patient management decisions. Negative results must be combined with clinical observations, patient history, and epidemiological information. The expected result is Negative.  Fact Sheet for Patients: SugarRoll.be  Fact Sheet for Healthcare Providers: https://www.woods-mathews.com/  This  test is not yet approved or cleared by the [place] and  has been authorized for detection and/or diagnosis of SARS-CoV-2 by FDA under an Emergency Use Authorization (EUA). This EUA will remain  in effect (meaning this test can be used) for the duration of the COVID-19 declaration under Se ction 564(b)(1) of the Act, 21 U.S.C. section 360bbb-3(b)(1), unless the authorization is terminated or revoked sooner.  Performed at [facility], [place] [address], [place], [place] [address]      Radiology Studies: DG Chest 2  View  Result Date: 06/25/2020 CLINICAL DATA:  Cough. EXAM: CHEST - 2 VIEW COMPARISON:  Chest x-ray 10/03/2019. FINDINGS: Mediastinum hilar structures normal. Cardiomegaly. Mild right base atelectasis/infiltrate cannot be excluded. No pleural effusion or pneumothorax. Degenerative change thoracic spine. Surgical clips upper abdomen. IMPRESSION: 1.  Cardiomegaly.  No pulmonary venous congestion. 2.  Mild right base atelectasis/infiltrate cannot be excluded. Electronically Signed   By: Marcello Moores  Register   On: 06/25/2020 12:54     LOS: 3 days   Florencia Reasons, MD PhD FACP [facility]  06/26/2020, 3:21 PM

## 2020-06-27 ENCOUNTER — Inpatient Hospital Stay (HOSPITAL_COMMUNITY): Payer: Medicare Other

## 2020-06-27 DIAGNOSIS — N185 Chronic kidney disease, stage 5: Secondary | ICD-10-CM

## 2020-06-27 LAB — CBC WITH DIFFERENTIAL/PLATELET
Abs Immature Granulocytes: 0.29 10*3/uL — ABNORMAL HIGH (ref 0.00–0.07)
Basophils Absolute: 0.1 10*3/uL (ref 0.0–0.1)
Basophils Relative: 1 %
Eosinophils Absolute: 0.9 10*3/uL — ABNORMAL HIGH (ref 0.0–0.5)
Eosinophils Relative: 6 %
HCT: 23.4 % — ABNORMAL LOW (ref 36.0–46.0)
Hemoglobin: 7.7 g/dL — ABNORMAL LOW (ref 12.0–15.0)
Immature Granulocytes: 2 %
Lymphocytes Relative: 35 %
Lymphs Abs: 4.8 10*3/uL — ABNORMAL HIGH (ref 0.7–4.0)
MCH: 32.9 pg (ref 26.0–34.0)
MCHC: 32.9 g/dL (ref 30.0–36.0)
MCV: 100 fL (ref 80.0–100.0)
Monocytes Absolute: 1.6 10*3/uL — ABNORMAL HIGH (ref 0.1–1.0)
Monocytes Relative: 12 %
Neutro Abs: 5.9 10*3/uL (ref 1.7–7.7)
Neutrophils Relative %: 44 %
Platelets: 561 10*3/uL — ABNORMAL HIGH (ref 150–400)
RBC: 2.34 MIL/uL — ABNORMAL LOW (ref 3.87–5.11)
RDW: 15.7 % — ABNORMAL HIGH (ref 11.5–15.5)
WBC: 13.6 10*3/uL — ABNORMAL HIGH (ref 4.0–10.5)
nRBC: 0.2 % (ref 0.0–0.2)

## 2020-06-27 LAB — LACTIC ACID, PLASMA: Lactic Acid, Venous: 0.7 mmol/L (ref 0.5–1.9)

## 2020-06-27 LAB — BASIC METABOLIC PANEL
Anion gap: 7 (ref 5–15)
BUN: 43 mg/dL — ABNORMAL HIGH (ref 8–23)
CO2: 20 mmol/L — ABNORMAL LOW (ref 22–32)
Calcium: 8.8 mg/dL — ABNORMAL LOW (ref 8.9–10.3)
Chloride: 106 mmol/L (ref 98–111)
Creatinine, Ser: 4.29 mg/dL — ABNORMAL HIGH (ref 0.44–1.00)
GFR, Estimated: 11 mL/min — ABNORMAL LOW (ref >60)
Glucose, Bld: 103 mg/dL — ABNORMAL HIGH (ref 70–99)
Potassium: 5.8 mmol/L — ABNORMAL HIGH (ref 3.5–5.1)
Sodium: 133 mmol/L — ABNORMAL LOW (ref 135–145)

## 2020-06-27 LAB — PROCALCITONIN: Procalcitonin: 0.22 ng/mL

## 2020-06-27 MED ORDER — SODIUM ZIRCONIUM CYCLOSILICATE 5 G PO PACK
5.0000 g | PACK | Freq: Once | ORAL | Status: DC
  Filled 2020-06-27: qty 1

## 2020-06-27 MED ORDER — SODIUM CHLORIDE 0.9 % IV SOLN
1.0000 g | INTRAVENOUS | Status: DC
  Administered 2020-06-27 – 2020-06-30 (×4): 1 g via INTRAVENOUS
  Filled 2020-06-27 (×2): qty 10
  Filled 2020-06-27 (×3): qty 1

## 2020-06-27 MED ORDER — SODIUM CHLORIDE 0.9 % IV SOLN
500.0000 mg | INTRAVENOUS | Status: DC
  Administered 2020-06-27 – 2020-06-30 (×4): 500 mg via INTRAVENOUS
  Filled 2020-06-27 (×5): qty 500

## 2020-06-27 MED ORDER — SODIUM ZIRCONIUM CYCLOSILICATE 10 G PO PACK
10.0000 g | PACK | Freq: Once | ORAL | Status: AC
  Administered 2020-06-27: 10 g via ORAL
  Filled 2020-06-27: qty 1

## 2020-06-27 NOTE — Progress Notes (Signed)
V/O given by Vashti Hey, MD for pt to get a shower.

## 2020-06-27 NOTE — Progress Notes (Addendum)
PROGRESS NOTE    Debra Petersen  [id number]  DOB: 02/21/1951  DOA: 06/23/2020 PCP: Prince Solian, MD Outpatient Specialists:   Hospital course: 70 year old female was admitted in acute on chronic renal failure on 06/23/2020 secondary to ongoing nausea and vomiting thought to be secondary to viral illness.  Admission creatinine was 6.5, baseline is around 4.  Patient has been treated in house with IV hydration and a creatinine has returned to baseline.  Chest x-ray revealed possible infiltration however patient has not been treated for pneumonia.  Subjective:  Patient states that she has persistent cough which is bothering her.  She is also feeling quite weak.  Is not sure if she is short of breath or is just generally weak.  No further vomiting.  He has chronic diarrhea on cholestyramine.  States she is urinating okay.   Objective: Vitals:   06/26/20 0723 06/26/20 1124 06/26/20 1603 06/26/20 2134  BP: (!) 145/79 (!) 150/67  (!) 145/76  Pulse: 86 86  87  Resp: '16 20  20  '$ Temp: 98 F (36.7 C) 98.3 F (36.8 C) 98.2 F (36.8 C) 98.3 F (36.8 C)  TempSrc: Oral Oral Oral Oral  SpO2: 98%   99%  Weight:        Intake/Output Summary (Last 24 hours) at 06/27/2020 1437 Last data filed at 06/26/2020 1604 Gross per 24 hour  Intake 960 ml  Output 700 ml  Net 260 ml   Filed Weights   06/25/20 0742  Weight: 51.9 kg     Exam:  General: Somewhat tired appearing female with deep wet sounding nonproductive cough in no distress. Eyes: sclera anicteric, conjuctiva mild injection bilaterally CVS: S1-S2, regular  Respiratory: She has significant coarse crackles at the left base halfway up.  Few rales at right base. GI: NABS, soft, NT  LE: No edema.  Neuro: A/O x 3, Moving all extremities equally with normal strength, CN 3-12 intact, grossly nonfocal.  Psych: patient is logical and coherent, judgement and insight appear normal, mood and affect appropriate to  situation.   Assessment & Plan:   70 year old female was admitted with acute on chronic renal failure which is now resolved.  This x-ray showed infiltration and patient has leukocytosis however has no oxygen requirement.  Lung exam is notable for coarse crackles at the left base.  Cough with leukocytosis and rales at left base. Patient has been afebrile here however reports chills and sweats. ProCalcitonin 0.24 and leukocytosis persists. Given abnormal lung exam, will start patient on empiric treatment for CAP with ceftriaxone azithromycin. Noncontrast chest CT also ordered to give a better understanding of what is happening at the left base  Hyperkalemia Calcium has been slowly climbing with replacement Today is 5.8, will treat with Lokelma and recheck in the morning.  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  Is patient medically stable for Discharge: Not yet   Consultants:  Nephrology  Procedures:  None  Antimicrobials:  Ceftriaxone azithromycin started on 06/27/2020   Data Reviewed:  Basic Metabolic Panel: Recent Labs  Lab  06/24/20 0420 06/24/20 1158 06/25/20 0122 06/25/20 1544 06/26/20 0136 06/27/20 0050  NA 140 141 137  --  136 133*  K 2.7* 2.9* 3.3*  --  5.3* 5.8*  CL 112* 109 111  --  108 106  CO2 20* 25 20*  --  21* 20*  GLUCOSE 89 84 80  --  95 103*  BUN 30* 29* 33*  --  33* 43*  CREATININE 5.20* 5.14* 4.82*  --  4.14* 4.29*  CALCIUM 7.5* 7.3* 7.8*  --  8.8* 8.8*  MG 1.2*  --   --  1.2* 2.8*  --   PHOS 3.9  --   --   --   --   --    Liver Function Tests: Recent Labs  Lab 06/24/20 0420 06/24/20 1035  AST  --  15  ALT  --  14  ALKPHOS  --  85  BILITOT  --  0.4  PROT  --  5.6*  ALBUMIN 2.1* 2.3*   No results for input(s): LIPASE, AMYLASE in the last 168 hours. No results for input(s): AMMONIA in the last 168 hours. CBC: Recent Labs  Lab 06/23/20 1623 06/25/20 0122 06/26/20 0136 06/27/20 0050  WBC 8.5 12.2* 13.7* 13.6*  NEUTROABS 4.6  --  5.9 5.9  HGB 8.9* 8.4* 8.8* 7.7*  HCT 25.9* 24.4* 26.2* 23.4*  MCV 97.0 96.4 98.9 100.0  PLT 521* 596* 625* 561*   Cardiac Enzymes: No results for input(s): CKTOTAL, CKMB, CKMBINDEX, TROPONINI in the last 168 hours. BNP (last 3 results) No results for input(s): PROBNP in the last 8760 hours. CBG: No results for input(s): GLUCAP in the last 168 hours.  Recent Results (from the past 240 hour(s))  SARS CORONAVIRUS 2 (TAT 6-24 HRS) Nasopharyngeal Nasopharyngeal Swab     Status: None   Collection Time: 06/23/20  4:30 PM   Specimen: Nasopharyngeal Swab  Result Value Ref Range Status   SARS Coronavirus 2 NEGATIVE NEGATIVE Final    Comment: (NOTE) SARS-CoV-2 target nucleic acids are NOT DETECTED.  The SARS-CoV-2 RNA is generally detectable in upper and lower respiratory specimens during the acute phase of infection. Negative results do not preclude SARS-CoV-2 infection, do not rule out co-infections with other pathogens, and should not be used as the sole basis for treatment or other patient management decisions. Negative results must be  combined with clinical observations, patient history, and epidemiological information. The expected result is Negative.  Fact Sheet for Patients: SugarRoll.be  Fact Sheet for Healthcare Providers: https://www.woods-mathews.com/  This test is not yet approved or cleared by the [place] FDA and  has been authorized for detection and/or diagnosis of SARS-CoV-2 by FDA under an Emergency Use Authorization (EUA). This EUA will remain  in effect (meaning this test can be used) for the duration of the COVID-19 declaration under Se ction 564(b)(1) of the Act, 21 U.S.C. section 360bbb-3(b)(1), unless the authorization is terminated or revoked sooner.  Performed at [facility], [place] [address], [place], [place] [address]   Culture, blood (routine x 2)     Status: None (Preliminary result)   Collection Time: 06/26/20  4:08 PM   Specimen: BLOOD RIGHT ARM  Result Value  Ref Range Status   Specimen Description BLOOD RIGHT ARM  Final   Special Requests   Final    BOTTLES DRAWN AEROBIC AND ANAEROBIC Blood Culture adequate volume   Culture   Final    NO GROWTH < 24 HOURS Performed at [facility], [address] [place], [place] [address]    Report Status PENDING  Incomplete  Culture, blood (routine x 2)     Status: None (Preliminary result)   Collection Time: 06/26/20  4:10 PM   Specimen: BLOOD LEFT HAND  Result Value Ref Range Status   Specimen Description BLOOD LEFT HAND  Final   Special Requests   Final    BOTTLES DRAWN AEROBIC AND ANAEROBIC Blood Culture adequate volume   Culture   Final    NO GROWTH < 24 HOURS Performed at [facility], [place] [address] [place], [place] [address]    Report Status PENDING  Incomplete  Respiratory (~20 pathogens) panel by PCR     Status: None   Collection Time: 06/26/20  4:21 PM   Specimen: Nasopharyngeal Swab; Respiratory  Result Value Ref Range Status   Adenovirus NOT DETECTED NOT  DETECTED Final   Coronavirus 229E NOT DETECTED NOT DETECTED Final    Comment: (NOTE) The Coronavirus on the Respiratory Panel, DOES NOT test for the novel  Coronavirus (2019 nCoV)    Coronavirus HKU1 NOT DETECTED NOT DETECTED Final   Coronavirus NL63 NOT DETECTED NOT DETECTED Final   Coronavirus OC43 NOT DETECTED NOT DETECTED Final   Metapneumovirus NOT DETECTED NOT DETECTED Final   Rhinovirus / Enterovirus NOT DETECTED NOT DETECTED Final   Influenza A NOT DETECTED NOT DETECTED Final   Influenza B NOT DETECTED NOT DETECTED Final   Parainfluenza Virus 1 NOT DETECTED NOT DETECTED Final   Parainfluenza Virus 2 NOT DETECTED NOT DETECTED Final   Parainfluenza Virus 3 NOT DETECTED NOT DETECTED Final   Parainfluenza Virus 4 NOT DETECTED NOT DETECTED Final   Respiratory Syncytial Virus NOT DETECTED NOT DETECTED Final   Bordetella pertussis NOT DETECTED NOT DETECTED Final   Bordetella Parapertussis NOT DETECTED NOT DETECTED Final   Chlamydophila pneumoniae NOT DETECTED NOT DETECTED Final   Mycoplasma pneumoniae NOT DETECTED NOT DETECTED Final    Comment: Performed at [facility], [address] [place], [place] [address]      Studies: No results found.   Scheduled Meds: . amLODipine  5 mg Oral Daily  . calcitRIOL  0.25 mcg Oral Daily  . cholestyramine light  4 g Oral Daily  . ferrous sulfate  325 mg Oral BID WC  . guaiFENesin  600 mg Oral BID  . heparin injection (subcutaneous)  5,000 Units Subcutaneous Q12H  . pravastatin  20 mg Oral q1800  . propafenone  150 mg Oral Q12H  . sodium bicarbonate  650 mg Oral TID   Continuous Infusions: . azithromycin    . cefTRIAXone (ROCEPHIN)  IV      Active Problems:   AKI (acute kidney injury) ([place])     Oveda Dadamo Derek Jack, [facility]  If 7PM-7AM, please contact night-coverage [email or web address]   LOS: 4 days

## 2020-06-28 LAB — CBC
HCT: 24 % — ABNORMAL LOW (ref 36.0–46.0)
Hemoglobin: 7.8 g/dL — ABNORMAL LOW (ref 12.0–15.0)
MCH: 32.9 pg (ref 26.0–34.0)
MCHC: 32.5 g/dL (ref 30.0–36.0)
MCV: 101.3 fL — ABNORMAL HIGH (ref 80.0–100.0)
Platelets: 581 10*3/uL — ABNORMAL HIGH (ref 150–400)
RBC: 2.37 MIL/uL — ABNORMAL LOW (ref 3.87–5.11)
RDW: 15.9 % — ABNORMAL HIGH (ref 11.5–15.5)
WBC: 11.9 10*3/uL — ABNORMAL HIGH (ref 4.0–10.5)
nRBC: 0 % (ref 0.0–0.2)

## 2020-06-28 LAB — COMPREHENSIVE METABOLIC PANEL
ALT: 13 U/L (ref 0–44)
AST: 16 U/L (ref 15–41)
Albumin: 2.3 g/dL — ABNORMAL LOW (ref 3.5–5.0)
Alkaline Phosphatase: 90 U/L (ref 38–126)
Anion gap: 6 (ref 5–15)
BUN: 52 mg/dL — ABNORMAL HIGH (ref 8–23)
CO2: 17 mmol/L — ABNORMAL LOW (ref 22–32)
Calcium: 9.1 mg/dL (ref 8.9–10.3)
Chloride: 112 mmol/L — ABNORMAL HIGH (ref 98–111)
Creatinine, Ser: 4.18 mg/dL — ABNORMAL HIGH (ref 0.44–1.00)
GFR, Estimated: 11 mL/min — ABNORMAL LOW (ref >60)
Glucose, Bld: 88 mg/dL (ref 70–99)
Potassium: 6 mmol/L — ABNORMAL HIGH (ref 3.5–5.1)
Sodium: 135 mmol/L (ref 135–145)
Total Bilirubin: 0.3 mg/dL (ref 0.3–1.2)
Total Protein: 5.8 g/dL — ABNORMAL LOW (ref 6.5–8.1)

## 2020-06-28 LAB — PHOSPHORUS: Phosphorus: 6.9 mg/dL — ABNORMAL HIGH (ref 2.5–4.6)

## 2020-06-28 LAB — POTASSIUM: Potassium: 5.5 mmol/L — ABNORMAL HIGH (ref 3.5–5.1)

## 2020-06-28 MED ORDER — SODIUM ZIRCONIUM CYCLOSILICATE 10 G PO PACK
10.0000 g | PACK | Freq: Once | ORAL | Status: AC
  Administered 2020-06-28: 10 g via ORAL
  Filled 2020-06-28: qty 1

## 2020-06-28 MED ORDER — SODIUM ZIRCONIUM CYCLOSILICATE 10 G PO PACK
10.0000 g | PACK | Freq: Three times a day (TID) | ORAL | Status: AC
  Administered 2020-06-28 (×2): 10 g via ORAL
  Filled 2020-06-28 (×2): qty 1

## 2020-06-28 NOTE — Plan of Care (Signed)

## 2020-06-28 NOTE — Progress Notes (Signed)
PROGRESS NOTE    Debra Petersen  [id number]  DOB: [birth year]  DOA: 06/23/2020 PCP: Prince Solian, MD Outpatient Specialists:   Hospital course: 70 year old female was admitted in acute on chronic renal failure on 06/23/2020 secondary to ongoing nausea and vomiting thought to be secondary to viral illness.  Admission creatinine was 6.5, baseline is around 4.  Patient has been treated in house with IV hydration and a creatinine has returned to baseline.  Chest x-ray revealed possible infiltration however patient has not been treated for pneumonia.  Ceftriaxone and azithromycin started 06/27/2020.  Subjective:  Patient states she does not really feel any different since starting the antibiotics.  She states she read her CT report and agrees with starting antibiotics because it looks like she is having an early pneumonia.  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  Objective: Vitals:   06/27/20 1704 06/27/20 2115 06/28/20 0507 06/28/20 0648  BP: 127/65 (!) 116/59 131/64   Pulse: 94 93 95   Resp: '20 18 16   '$ Temp: 99.3 F (37.4 C) 98.2 F (36.8 C) 98.2 F (36.8 C)   TempSrc: Oral Oral Oral   SpO2: 97% 93% 97%   Weight:    53.2 kg    Intake/Output Summary (Last 24 hours) at 06/28/2020 1619 Last data filed at 06/28/2020 0500 Gross per 24 hour  Intake 1070 ml  Output --  Net 1070 ml   Filed Weights   06/25/20 0742 06/28/20 0648  Weight: 51.9 kg 53.2 kg     Exam:  General: Somewhat tired appearing female with deep wet sounding nonproductive cough in no distress. CVS: S1-S2, regular  Respiratory:  GI: NABS, soft, NT  LE: No edema.  Neuro: A/O x 3, Moving all extremities equally with normal strength, CN 3-12 intact, grossly nonfocal.  Psych: patient is logical and coherent, judgement and insight appear normal, mood and affect appropriate to situation.   Assessment & Plan:    70 year old female was admitted with acute on chronic renal failure which is now resolved.  This x-ray showed infiltration and patient has leukocytosis however has no oxygen requirement.  Lung exam is notable for coarse crackles at the left base.  CAP and asplenic patient. Patient does not really feel improved since starting on ceftriaxone azithromycin However her cough does sound much less wet to me when I listen to her. 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 Would transition over to oral meds and discharge home tomorrow if she is feeling improved.  Hyperkalemia Potassium is increased despite treatment with Lokelma yesterday. She was likely aggressively repleted in the past week. Continue treatment with Lokelma and recheck potassium this afternoon.  Acute on chronic kidney injury Improved with hydration, creatinine is apparently back to baseline around 4 NAGMA likely secondary to chronic kidney failure, on bicarb replacement orally Patient can follow-up with outpatient nephrology as scheduled  Anemia Patient's hemoglobin is stable at 7.8 today however decreased from baseline of 8.5 She remains hemodynamically stable Likely secondary to a combination of CKD and ACD  Nausea and vomiting Resolved with conservative treatment Zofran  Chronic diarrhea Continue cholestyramine  Atrial fib/flutter status post ablation Continue propafenone per home doses  HTN Continue amlodipine   DVT prophylaxis: Subcu heparin Code Status: Full Family Communication: None Disposition Plan:   Patient is  from: Home  Anticipated Discharge Location: Home  Barriers to Discharge: Persistent hyperkalemia  is patient medically stable for Discharge: Not yet   Consultants:  Nephrology  Procedures:  None  Antimicrobials:  Ceftriaxone azithromycin started on 06/27/2020   Data Reviewed:  Basic Metabolic  Panel: Recent Labs  Lab 06/24/20 0420 06/24/20 1158 06/25/20 0122 06/25/20 1544 06/26/20 0136 06/27/20 0050 06/28/20 0145  NA 140 141 137  --  136 133* 135  K 2.7* 2.9* 3.3*  --  5.3* 5.8* 6.0*  CL 112* 109 111  --  108 106 112*  CO2 20* 25 20*  --  21* 20* 17*  GLUCOSE 89 84 80  --  95 103* 88  BUN 30* 29* 33*  --  33* 43* 52*  CREATININE 5.20* 5.14* 4.82*  --  4.14* 4.29* 4.18*  CALCIUM 7.5* 7.3* 7.8*  --  8.8* 8.8* 9.1  MG 1.2*  --   --  1.2* 2.8*  --   --   PHOS 3.9  --   --   --   --   --  6.9*   Liver Function Tests: Recent Labs  Lab 06/24/20 0420 06/24/20 1035 06/28/20 0145  AST  --  15 16  ALT  --  14 13  ALKPHOS  --  85 90  BILITOT  --  0.4 0.3  PROT  --  5.6* 5.8*  ALBUMIN 2.1* 2.3* 2.3*   No results for input(s): LIPASE, AMYLASE in the last 168 hours. No results for input(s): AMMONIA in the last 168 hours. CBC: Recent Labs  Lab 06/23/20 1623 06/25/20 0122 06/26/20 0136 06/27/20 0050 06/28/20 0145  WBC 8.5 12.2* 13.7* 13.6* 11.9*  NEUTROABS 4.6  --  5.9 5.9  --   HGB 8.9* 8.4* 8.8* 7.7* 7.8*  HCT 25.9* 24.4* 26.2* 23.4* 24.0*  MCV 97.0 96.4 98.9 100.0 101.3*  PLT 521* 596* 625* 561* 581*   Cardiac Enzymes: No results for input(s): CKTOTAL, CKMB, CKMBINDEX, TROPONINI in the last 168 hours. BNP (last 3 results) No results for input(s): PROBNP in the last 8760 hours. CBG: No results for input(s): GLUCAP in the last 168 hours.  Recent Results (from the past 240 hour(s))  SARS CORONAVIRUS 2 (TAT 6-24 HRS) Nasopharyngeal Nasopharyngeal Swab     Status: None   Collection Time: 06/23/20  4:30 PM   Specimen: Nasopharyngeal Swab  Result Value Ref Range Status   SARS Coronavirus 2 NEGATIVE NEGATIVE Final    Comment: (NOTE) SARS-CoV-2 target nucleic acids are NOT DETECTED.  The SARS-CoV-2 RNA is generally detectable in upper and lower respiratory specimens during the acute phase of infection. Negative results do not preclude SARS-CoV-2 infection, do  not rule out co-infections with other pathogens, and should not be used as the sole basis for treatment or other patient management decisions. Negative results must be combined with clinical observations, patient history, and epidemiological information. The expected result is Negative.  Fact Sheet for Patients: SugarRoll.be  Fact Sheet for Healthcare Providers: https://www.woods-mathews.com/  This test is not yet approved or cleared by the [place] FDA and  has been authorized for detection and/or diagnosis of SARS-CoV-2 by FDA under an Emergency Use Authorization (EUA). This EUA will remain  in effect (meaning this test can be used) for the duration of the COVID-19 declaration under Se ction 564(b)(1) of the Act, 21 U.S.C. section 360bbb-3(b)(1), unless the authorization is terminated or revoked sooner.  Performed at [facility], [place],  [place] [address]   Culture, blood (routine x 2)     Status: None (Preliminary result)   Collection Time: 06/26/20  4:08 PM   Specimen: BLOOD RIGHT ARM  Result Value Ref Range Status   Specimen Description BLOOD RIGHT ARM  Final   Special Requests   Final    BOTTLES DRAWN AEROBIC AND ANAEROBIC Blood Culture adequate volume   Culture   Final    NO GROWTH 2 DAYS Performed at [facility], [address] [place], [place] [address]    Report Status PENDING  Incomplete  Culture, blood (routine x 2)     Status: None (Preliminary result)   Collection Time: 06/26/20  4:10 PM   Specimen: BLOOD LEFT HAND  Result Value Ref Range Status   Specimen Description BLOOD LEFT HAND  Final   Special Requests   Final    BOTTLES DRAWN AEROBIC AND ANAEROBIC Blood Culture adequate volume   Culture   Final    NO GROWTH 2 DAYS Performed at [facility], [place] [address], [place], [place] [address]    Report Status PENDING  Incomplete  Respiratory (~20 pathogens) panel by PCR      Status: None   Collection Time: 06/26/20  4:21 PM   Specimen: Nasopharyngeal Swab; Respiratory  Result Value Ref Range Status   Adenovirus NOT DETECTED NOT DETECTED Final   Coronavirus 229E NOT DETECTED NOT DETECTED Final    Comment: (NOTE) The Coronavirus on the Respiratory Panel, DOES NOT test for the novel  Coronavirus (2019 nCoV)    Coronavirus HKU1 NOT DETECTED NOT DETECTED Final   Coronavirus NL63 NOT DETECTED NOT DETECTED Final   Coronavirus OC43 NOT DETECTED NOT DETECTED Final   Metapneumovirus NOT DETECTED NOT DETECTED Final   Rhinovirus / Enterovirus NOT DETECTED NOT DETECTED Final   Influenza A NOT DETECTED NOT DETECTED Final   Influenza B NOT DETECTED NOT DETECTED Final   Parainfluenza Virus 1 NOT DETECTED NOT DETECTED Final   Parainfluenza Virus 2 NOT DETECTED NOT DETECTED Final   Parainfluenza Virus 3 NOT DETECTED NOT DETECTED Final   Parainfluenza Virus 4 NOT DETECTED NOT DETECTED Final   Respiratory Syncytial Virus NOT DETECTED NOT DETECTED Final   Bordetella pertussis NOT DETECTED NOT DETECTED Final   Bordetella Parapertussis NOT DETECTED NOT DETECTED Final   Chlamydophila pneumoniae NOT DETECTED NOT DETECTED Final   Mycoplasma pneumoniae NOT DETECTED NOT DETECTED Final    Comment: Performed at [facility], [address], [place], [place] [address]      Studies: CT CHEST WO CONTRAST  Result Date: 06/27/2020 CLINICAL DATA:  Cough. EXAM: CT CHEST WITHOUT CONTRAST TECHNIQUE: Multidetector CT imaging of the chest was performed following the standard protocol without IV contrast. COMPARISON:  Chest radiograph 06/25/2020 FINDINGS: Cardiovascular: Normal heart size. No pericardial effusions. Mild coronary artery calcification. Calcification of the aorta. No aneurysm. Mediastinum/Nodes: Esophagus is decompressed. Scattered lymph nodes in the mediastinum are not pathologically enlarged. Thyroid gland is unremarkable. Lungs/Pleura: Mild scarring in the lung apices.  Scattered patchy areas of subpleural fibrosis or infiltration. Motion artifact in the bases limits evaluation but there appears to be patchy airspace disease with a nodular configuration in the bases, greatest on the left. This may indicate early pneumonia. Airways are patent. No pleural effusions. No pneumothorax. Upper Abdomen: Surgical absence of the gallbladder. No acute changes demonstrated in the visualized upper abdomen. Musculoskeletal: Degenerative changes in the spine. No destructive bone lesions. IMPRESSION: 1. Patchy airspace disease  with a nodular configuration in the bases, greatest on the left. This may indicate early pneumonia. 2. Scattered patchy areas of subpleural fibrosis or infiltration. 3. Aortic atherosclerosis. Aortic Atherosclerosis (ICD10-I70.0). Electronically Signed   By: Lucienne Capers M.D.   On: 06/27/2020 22:18     Scheduled Meds: . amLODipine  5 mg Oral Daily  . calcitRIOL  0.25 mcg Oral Daily  . cholestyramine light  4 g Oral Daily  . ferrous sulfate  325 mg Oral BID WC  . guaiFENesin  600 mg Oral BID  . heparin injection (subcutaneous)  5,000 Units Subcutaneous Q12H  . pravastatin  20 mg Oral q1800  . propafenone  150 mg Oral Q12H  . sodium bicarbonate  650 mg Oral TID   Continuous Infusions: . azithromycin 500 mg (06/28/20 1546)  . cefTRIAXone (ROCEPHIN)  IV 1 g (06/28/20 1543)    Active Problems:   AKI (acute kidney injury) ([place])     Mirai Greenwood Derek Jack, [facility]  If 7PM-7AM, please contact night-coverage [email or web address]   LOS: 5 days

## 2020-06-28 NOTE — Progress Notes (Signed)
HOSPITAL MEDICINE OVERNIGHT EVENT NOTE    Patient noted to have potassium of 6.0 this morning, slight increase compared to yesterday despite administration of Lokelma.  Obtaining repeat EKG.  Administering additional dose of Lokelma with repeat chemistry later today.  Monitoring patient on telemetry.  Debra Emerald  MD [facility]

## 2020-06-29 DIAGNOSIS — N184 Chronic kidney disease, stage 4 (severe): Secondary | ICD-10-CM

## 2020-06-29 DIAGNOSIS — J189 Pneumonia, unspecified organism: Secondary | ICD-10-CM

## 2020-06-29 LAB — CBC WITH DIFFERENTIAL/PLATELET
Abs Immature Granulocytes: 0.2 10*3/uL — ABNORMAL HIGH (ref 0.00–0.07)
Basophils Absolute: 0.1 10*3/uL (ref 0.0–0.1)
Basophils Relative: 1 %
Eosinophils Absolute: 0.8 10*3/uL — ABNORMAL HIGH (ref 0.0–0.5)
Eosinophils Relative: 6 %
HCT: 24.3 % — ABNORMAL LOW (ref 36.0–46.0)
Hemoglobin: 7.9 g/dL — ABNORMAL LOW (ref 12.0–15.0)
Immature Granulocytes: 2 %
Lymphocytes Relative: 35 %
Lymphs Abs: 4.3 10*3/uL — ABNORMAL HIGH (ref 0.7–4.0)
MCH: 33.5 pg (ref 26.0–34.0)
MCHC: 32.5 g/dL (ref 30.0–36.0)
MCV: 103 fL — ABNORMAL HIGH (ref 80.0–100.0)
Monocytes Absolute: 1.4 10*3/uL — ABNORMAL HIGH (ref 0.1–1.0)
Monocytes Relative: 12 %
Neutro Abs: 5.5 10*3/uL (ref 1.7–7.7)
Neutrophils Relative %: 44 %
Platelets: 572 10*3/uL — ABNORMAL HIGH (ref 150–400)
RBC: 2.36 MIL/uL — ABNORMAL LOW (ref 3.87–5.11)
RDW: 15.8 % — ABNORMAL HIGH (ref 11.5–15.5)
WBC: 12.3 10*3/uL — ABNORMAL HIGH (ref 4.0–10.5)
nRBC: 0 % (ref 0.0–0.2)

## 2020-06-29 LAB — BASIC METABOLIC PANEL
Anion gap: 8 (ref 5–15)
BUN: 46 mg/dL — ABNORMAL HIGH (ref 8–23)
CO2: 18 mmol/L — ABNORMAL LOW (ref 22–32)
Calcium: 9.2 mg/dL (ref 8.9–10.3)
Chloride: 112 mmol/L — ABNORMAL HIGH (ref 98–111)
Creatinine, Ser: 4.21 mg/dL — ABNORMAL HIGH (ref 0.44–1.00)
GFR, Estimated: 11 mL/min — ABNORMAL LOW (ref >60)
Glucose, Bld: 95 mg/dL (ref 70–99)
Potassium: 5.6 mmol/L — ABNORMAL HIGH (ref 3.5–5.1)
Sodium: 138 mmol/L (ref 135–145)

## 2020-06-29 MED ORDER — SODIUM ZIRCONIUM CYCLOSILICATE 10 G PO PACK
10.0000 g | PACK | Freq: Once | ORAL | Status: AC
  Administered 2020-06-29: 10 g via ORAL
  Filled 2020-06-29: qty 1

## 2020-06-29 MED ORDER — SODIUM BICARBONATE 650 MG PO TABS
1300.0000 mg | ORAL_TABLET | Freq: Three times a day (TID) | ORAL | Status: DC
  Administered 2020-06-29 – 2020-07-01 (×6): 1300 mg via ORAL
  Filled 2020-06-29 (×6): qty 2

## 2020-06-29 MED ORDER — SODIUM ZIRCONIUM CYCLOSILICATE 10 G PO PACK
10.0000 g | PACK | Freq: Every day | ORAL | Status: DC
  Administered 2020-06-30 – 2020-07-01 (×2): 10 g via ORAL
  Filled 2020-06-29 (×2): qty 1

## 2020-06-29 NOTE — Plan of Care (Signed)

## 2020-06-29 NOTE — Progress Notes (Addendum)
Patient ID: Debra Petersen, female   DOB: 1950/07/11, 70 y.o.   MRN: [id number]  PROGRESS NOTE    Debra Petersen  [id number] DOB: 1951/01/17 DOA: 06/23/2020 PCP: Prince Solian, MD   Brief Narrative:  70 year old female was admitted in acute on chronic renal failure on 06/23/2020 secondary to ongoing nausea and vomiting thought to be secondary to viral illness.  Admission creatinine was 6.5, baseline is around 4.  Patient has been treated in house with IV hydration and a creatinine has returned to baseline.  Chest x-ray revealed possible infiltration.  Ceftriaxone and azithromycin started on 06/27/2020.  Nephrology evaluated the patient and has signed off.  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  Continue Rocephin and Zithromax.  Currently on room air.  Acute kidney injury on chronic kidney disease stage IV Metabolic acidosis -Baseline creatinine is around 4 similar admission creatinine was 6.5.  Treated with IV fluids.  Nephrology evaluated the patient and has signed off.  Creatinine is 4.21 today which is almost close to baseline.  Monitor. -Bicarbonate 18 today.  Continue oral sodium bicarbonate supplementation but increase dose to 2 tablets 3 times daily after discussion with Dr. Bhandari/nephrology  Hyperkalemia -Potassium still at 5.6 today.  Patient will get 1 dose of Lokelma again today.   will continue daily Lokelma for now -Repeat a.m. potassium -Potassium restricted diet  Chronic diarrhea -Improving.  Continue colestyramine  Chronic A. fib/flutter status post ablation -Continue propafenone  Hypertension Continue amlodipine  Hyperlipidemia next-continue statin  Moderate protein calorie malnutrition Continue to follow nutrition recommendations  Iron deficiency anemia along with anemia of chronic disease -Continue iron supplementation    DVT prophylaxis: Heparin Code Status: Full Family Communication: None at  bedside Disposition Plan: Status is: Inpatient  Remains inpatient appropriate because:Inpatient level of care appropriate due to severity of illness   Dispo: The patient is from: Home              Anticipated d/c is to: Home in 1 to 2 days if potassium normalizes and patient remained stable              Patient currently is not medically stable to d/c.   Difficult to place patient No  Consultants: Nephrology  Procedures: None  Antimicrobials:  Rocephin and Zithromax day #3 today  Subjective: Patient seen and examined at bedside.  Denies worsening cough or fever.  Making urine.  No overnight worsening abdominal pain or diarrhea.  Objective: Vitals:   06/27/20 2115 06/28/20 0507 06/28/20 0648 06/29/20 0035  BP: (!) 116/59 131/64  133/70  Pulse: 93 95  92  Resp: '18 16  18  '$ Temp: 98.2 F (36.8 C) 98.2 F (36.8 C)  98.2 F (36.8 C)  TempSrc: Oral Oral  Oral  SpO2: 93% 97%  97%  Weight:   53.2 kg     Intake/Output Summary (Last 24 hours) at 06/29/2020 1044 Last data filed at 06/29/2020 1043 Gross per 24 hour  Intake 600 ml  Output --  Net 600 ml   Filed Weights   06/25/20 0742 06/28/20 0648  Weight: 51.9 kg 53.2 kg    Examination:  General exam: Appears calm and comfortable  Respiratory system: Bilateral decreased breath sounds at bases with some scattered crackles Cardiovascular system: S1 & S2 heard, Rate controlled Gastrointestinal system: Abdomen is nondistended, soft and nontender. Normal bowel sounds heard. Extremities: No cyanosis, clubbing; trace lower extremity edema Central nervous system: Alert and oriented. No focal neurological  deficits. Moving extremities Skin: No rashes, lesions or ulcers Psychiatry: Flat affect    Data Reviewed: I have personally reviewed following labs and imaging studies  CBC: Recent Labs  Lab 06/23/20 1623 06/25/20 0122 06/26/20 0136 06/27/20 0050 06/28/20 0145 06/29/20 0221  WBC 8.5 12.2* 13.7* 13.6* 11.9* 12.3*   NEUTROABS 4.6  --  5.9 5.9  --  5.5  HGB 8.9* 8.4* 8.8* 7.7* 7.8* 7.9*  HCT 25.9* 24.4* 26.2* 23.4* 24.0* 24.3*  MCV 97.0 96.4 98.9 100.0 101.3* 103.0*  PLT 521* 596* 625* 561* 581* 0000000*   Basic Metabolic Panel: Recent Labs  Lab 06/24/20 0420 06/24/20 1158 06/25/20 0122 06/25/20 1544 06/26/20 0136 06/27/20 0050 06/28/20 0145 06/28/20 1644 06/29/20 0221  NA 140   < > 137  --  136 133* 135  --  138  K 2.7*   < > 3.3*  --  5.3* 5.8* 6.0* 5.5* 5.6*  CL 112*   < > 111  --  108 106 112*  --  112*  CO2 20*   < > 20*  --  21* 20* 17*  --  18*  GLUCOSE 89   < > 80  --  95 103* 88  --  95  BUN 30*   < > 33*  --  33* 43* 52*  --  46*  CREATININE 5.20*   < > 4.82*  --  4.14* 4.29* 4.18*  --  4.21*  CALCIUM 7.5*   < > 7.8*  --  8.8* 8.8* 9.1  --  9.2  MG 1.2*  --   --  1.2* 2.8*  --   --   --   --   PHOS 3.9  --   --   --   --   --  6.9*  --   --    < > = values in this interval not displayed.   GFR: Estimated Creatinine Clearance: 10.4 mL/min (A) (by C-G formula based on SCr of 4.21 mg/dL (H)). Liver Function Tests: Recent Labs  Lab 06/24/20 0420 06/24/20 1035 06/28/20 0145  AST  --  15 16  ALT  --  14 13  ALKPHOS  --  85 90  BILITOT  --  0.4 0.3  PROT  --  5.6* 5.8*  ALBUMIN 2.1* 2.3* 2.3*   No results for input(s): LIPASE, AMYLASE in the last 168 hours. No results for input(s): AMMONIA in the last 168 hours. Coagulation Profile: No results for input(s): INR, PROTIME in the last 168 hours. Cardiac Enzymes: No results for input(s): CKTOTAL, CKMB, CKMBINDEX, TROPONINI in the last 168 hours. BNP (last 3 results) No results for input(s): PROBNP in the last 8760 hours. HbA1C: No results for input(s): HGBA1C in the last 72 hours. CBG: No results for input(s): GLUCAP in the last 168 hours. Lipid Profile: No results for input(s): CHOL, HDL, LDLCALC, TRIG, CHOLHDL, LDLDIRECT in the last 72 hours. Thyroid Function Tests: No results for input(s): TSH, T4TOTAL, FREET4, T3FREE,  THYROIDAB in the last 72 hours. Anemia Panel: No results for input(s): VITAMINB12, FOLATE, FERRITIN, TIBC, IRON, RETICCTPCT in the last 72 hours. Sepsis Labs: Recent Labs  Lab 06/25/20 1544 06/26/20 0136 06/27/20 0050  PROCALCITON 0.24 0.24 0.22  LATICACIDVEN  --   --  0.7    Recent Results (from the past 240 hour(s))  SARS CORONAVIRUS 2 (TAT 6-24 HRS) Nasopharyngeal Nasopharyngeal Swab     Status: None   Collection Time: 06/23/20  4:30 PM   Specimen: Nasopharyngeal  Swab  Result Value Ref Range Status   SARS Coronavirus 2 NEGATIVE NEGATIVE Final    Comment: (NOTE) SARS-CoV-2 target nucleic acids are NOT DETECTED.  The SARS-CoV-2 RNA is generally detectable in upper and lower respiratory specimens during the acute phase of infection. Negative results do not preclude SARS-CoV-2 infection, do not rule out co-infections with other pathogens, and should not be used as the sole basis for treatment or other patient management decisions. Negative results must be combined with clinical observations, patient history, and epidemiological information. The expected result is Negative.  Fact Sheet for Patients: SugarRoll.be  Fact Sheet for Healthcare Providers: https://www.woods-mathews.com/  This test is not yet approved or cleared by the [place] FDA and  has been authorized for detection and/or diagnosis of SARS-CoV-2 by FDA under an Emergency Use Authorization (EUA). This EUA will remain  in effect (meaning this test can be used) for the duration of the COVID-19 declaration under Se ction 564(b)(1) of the Act, 21 U.S.C. section 360bbb-3(b)(1), unless the authorization is terminated or revoked sooner.  Performed at [facility], [place] [address], [place], [place] [address]   Culture, blood (routine x 2)     Status: None (Preliminary result)   Collection Time: 06/26/20  4:08 PM   Specimen: BLOOD RIGHT ARM  Result Value Ref Range  Status   Specimen Description BLOOD RIGHT ARM  Final   Special Requests   Final    BOTTLES DRAWN AEROBIC AND ANAEROBIC Blood Culture adequate volume   Culture   Final    NO GROWTH 3 DAYS Performed at [facility], [place] [address], [place], [place] [address]    Report Status PENDING  Incomplete  Culture, blood (routine x 2)     Status: None (Preliminary result)   Collection Time: 06/26/20  4:10 PM   Specimen: BLOOD LEFT HAND  Result Value Ref Range Status   Specimen Description BLOOD LEFT HAND  Final   Special Requests   Final    BOTTLES DRAWN AEROBIC AND ANAEROBIC Blood Culture adequate volume   Culture   Final    NO GROWTH 3 DAYS Performed at [facility], [place] [address] [place], [place] [address]    Report Status PENDING  Incomplete  Respiratory (~20 pathogens) panel by PCR     Status: None   Collection Time: 06/26/20  4:21 PM   Specimen: Nasopharyngeal Swab; Respiratory  Result Value Ref Range Status   Adenovirus NOT DETECTED NOT DETECTED Final   Coronavirus 229E NOT DETECTED NOT DETECTED Final    Comment: (NOTE) The Coronavirus on the Respiratory Panel, DOES NOT test for the novel  Coronavirus (2019 nCoV)    Coronavirus HKU1 NOT DETECTED NOT DETECTED Final   Coronavirus NL63 NOT DETECTED NOT DETECTED Final   Coronavirus OC43 NOT DETECTED NOT DETECTED Final   Metapneumovirus NOT DETECTED NOT DETECTED Final   Rhinovirus / Enterovirus NOT DETECTED NOT DETECTED Final   Influenza A NOT DETECTED NOT DETECTED Final   Influenza B NOT DETECTED NOT DETECTED Final   Parainfluenza Virus 1 NOT DETECTED NOT DETECTED Final   Parainfluenza Virus 2 NOT DETECTED NOT DETECTED Final   Parainfluenza Virus 3 NOT DETECTED NOT DETECTED Final   Parainfluenza Virus 4 NOT DETECTED NOT DETECTED Final   Respiratory Syncytial Virus NOT DETECTED NOT DETECTED Final   Bordetella pertussis NOT DETECTED NOT DETECTED Final   Bordetella Parapertussis NOT DETECTED NOT DETECTED Final    Chlamydophila pneumoniae NOT DETECTED NOT DETECTED  Final   Mycoplasma pneumoniae NOT DETECTED NOT DETECTED Final    Comment: Performed at [facility], [place] [address], [place], [place] [address]         Radiology Studies: CT CHEST WO CONTRAST  Result Date: 06/27/2020 CLINICAL DATA:  Cough. EXAM: CT CHEST WITHOUT CONTRAST TECHNIQUE: Multidetector CT imaging of the chest was performed following the standard protocol without IV contrast. COMPARISON:  Chest radiograph 06/25/2020 FINDINGS: Cardiovascular: Normal heart size. No pericardial effusions. Mild coronary artery calcification. Calcification of the aorta. No aneurysm. Mediastinum/Nodes: Esophagus is decompressed. Scattered lymph nodes in the mediastinum are not pathologically enlarged. Thyroid gland is unremarkable. Lungs/Pleura: Mild scarring in the lung apices. Scattered patchy areas of subpleural fibrosis or infiltration. Motion artifact in the bases limits evaluation but there appears to be patchy airspace disease with a nodular configuration in the bases, greatest on the left. This may indicate early pneumonia. Airways are patent. No pleural effusions. No pneumothorax. Upper Abdomen: Surgical absence of the gallbladder. No acute changes demonstrated in the visualized upper abdomen. Musculoskeletal: Degenerative changes in the spine. No destructive bone lesions. IMPRESSION: 1. Patchy airspace disease with a nodular configuration in the bases, greatest on the left. This may indicate early pneumonia. 2. Scattered patchy areas of subpleural fibrosis or infiltration. 3. Aortic atherosclerosis. Aortic Atherosclerosis (ICD10-I70.0). Electronically Signed   By: Lucienne Capers M.D.   On: 06/27/2020 22:18        Scheduled Meds: . amLODipine  5 mg Oral Daily  . calcitRIOL  0.25 mcg Oral Daily  . cholestyramine light  4 g Oral Daily  . ferrous sulfate  325 mg Oral BID WC  . guaiFENesin  600 mg Oral BID  . heparin injection (subcutaneous)   5,000 Units Subcutaneous Q12H  . pravastatin  20 mg Oral q1800  . propafenone  150 mg Oral Q12H  . sodium bicarbonate  650 mg Oral TID   Continuous Infusions: . azithromycin 500 mg (06/29/20 0946)  . cefTRIAXone (ROCEPHIN)  IV 1 g (06/29/20 0805)          Aline August, MD [facility] 06/29/2020, 10:44 AM

## 2020-06-30 LAB — BASIC METABOLIC PANEL
Anion gap: 11 (ref 5–15)
BUN: 45 mg/dL — ABNORMAL HIGH (ref 8–23)
CO2: 15 mmol/L — ABNORMAL LOW (ref 22–32)
Calcium: 9.6 mg/dL (ref 8.9–10.3)
Chloride: 110 mmol/L (ref 98–111)
Creatinine, Ser: 4.29 mg/dL — ABNORMAL HIGH (ref 0.44–1.00)
GFR, Estimated: 11 mL/min — ABNORMAL LOW (ref >60)
Glucose, Bld: 91 mg/dL (ref 70–99)
Potassium: 5.8 mmol/L — ABNORMAL HIGH (ref 3.5–5.1)
Sodium: 136 mmol/L (ref 135–145)

## 2020-06-30 MED ORDER — STERILE WATER FOR INJECTION IV SOLN
INTRAVENOUS | Status: AC
  Filled 2020-06-30 (×2): qty 1000

## 2020-06-30 NOTE — Plan of Care (Signed)

## 2020-06-30 NOTE — Progress Notes (Signed)
[facility] Progress Note    Assessment/ Plan:    Pt is a 70 year old lady wide-complex tachycardia, SLE, stage IV chronic kidney disease.  She has been followed with Dr. Johnney Ou at [facility].    She was admitted 06/23/2020 with 6-day history of refractory nausea with vomiting.  And dry heaves.  In the emergency room creatinine was 6.5 mg/dL.  1.  AKI on CKD V - Her baseline creatinine appears to be around about 4 mg/dL. No signs of obstruction on her renal ultrasound.  UA was unremarkable.  Cr 4.29 today is slightly above baseline.  - Daily RFP -Renally dose medications  -Strict Is and Os  -Avoid nephrotoxic agents  - follow up with Dr. Johnney Ou outpatient  2.  Hyperkalemia - K 5.8. Patient received 10g Lokelma this morning. Follow up RFP. 3.  Metabolic acidosis- Continue IV sodium bicarbonate 119m/hr.  4.  Bone mineral. Continue calcitriol 0.25 mcg. 5.  Anemia- Continue BID iron supplements until iron stores repleted then start ESA. 6.  HTN - On amlodipine. Plan per primary. 7   HLD - per pmd 8.  H/o SLE 9.  Hx SVT - on propafenone  Subjective:   Denies pain.  Reports good urinary output yesterday.   Objective:   BP (!) 165/70 (BP Location: Right Arm)   Pulse 90   Temp 98.1 F (36.7 C) (Oral)   Resp 18   Wt 53.2 kg   SpO2 99%   BMI 20.78 kg/m   Intake/Output Summary (Last 24 hours) at 06/30/2020 [id number] data filed at 06/30/2020 0500 Gross per 24 hour  Intake 2640 ml  Output --  Net 2640 ml   Weight change:   Physical Exam: Gen: Alert, sitting upright in bed in no acute distress CVS: Regular rate and rhythm Resp: Bibasilar rales, no increased work of breathing Abd: Soft, non-tender, non-distended Ext: No lower extremity edema  Imaging: No results found.  Labs: BMET Recent Labs  Lab 06/24/20 0420 06/24/20 1158 06/25/20 0122 06/26/20 0136 06/27/20 0050 06/28/20 0145 06/28/20 1644 06/29/20 0221 06/30/20 0212  NA 140 141  137 136 133* 135  --  138 136  K 2.7* 2.9* 3.3* 5.3* 5.8* 6.0* 5.5* 5.6* 5.8*  CL 112* 109 111 108 106 112*  --  112* 110  CO2 20* 25 20* 21* 20* 17*  --  18* 15*  GLUCOSE 89 84 80 95 103* 88  --  95 91  BUN 30* 29* 33* 33* 43* 52*  --  46* 45*  CREATININE 5.20* 5.14* 4.82* 4.14* 4.29* 4.18*  --  4.21* 4.29*  CALCIUM 7.5* 7.3* 7.8* 8.8* 8.8* 9.1  --  9.2 9.6  PHOS 3.9  --   --   --   --  6.9*  --   --   --    CBC Recent Labs  Lab 06/23/20 1623 06/25/20 0122 06/26/20 0136 06/27/20 0050 06/28/20 0145 06/29/20 0221  WBC 8.5   < > 13.7* 13.6* 11.9* 12.3*  NEUTROABS 4.6  --  5.9 5.9  --  5.5  HGB 8.9*   < > 8.8* 7.7* 7.8* 7.9*  HCT 25.9*   < > 26.2* 23.4* 24.0* 24.3*  MCV 97.0   < > 98.9 100.0 101.3* 103.0*  PLT 521*   < > 625* 561* 581* 572*   < > = values in this interval not displayed.    Medications:    . amLODipine  5 mg Oral Daily  . calcitRIOL  0.25 mcg Oral Daily  . cholestyramine light  4 g Oral Daily  . ferrous sulfate  325 mg Oral BID WC  . guaiFENesin  600 mg Oral BID  . heparin injection (subcutaneous)  5,000 Units Subcutaneous Q12H  . pravastatin  20 mg Oral q1800  . propafenone  150 mg Oral Q12H  . sodium bicarbonate  1,300 mg Oral TID  . sodium zirconium cyclosilicate  10 g Oral Daily      Aldin Drees, DO  06/30/2020, 8:28 AM

## 2020-06-30 NOTE — Progress Notes (Signed)
Patient ID: Debra Petersen, female   DOB: [birth year], 70 y.o.   MRN: [id number]  PROGRESS NOTE    Debra Petersen  [id number] DOB: 1950-06-15 DOA: 06/23/2020 PCP: Prince Solian, MD   Brief Narrative:  70 year old female was admitted in acute on chronic renal failure on 06/23/2020 secondary to ongoing nausea and vomiting thought to be secondary to viral illness.  Admission creatinine was 6.5, baseline is around 4.  Patient has been treated in house with IV hydration and a creatinine has returned to baseline.  Chest x-ray revealed possible infiltration.  Ceftriaxone and azithromycin started on 06/27/2020.  Nephrology evaluated the patient and has signed off.  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  Continue Rocephin and Zithromax.  Currently on room air.  Acute kidney injury on chronic kidney disease stage IV Metabolic acidosis -Baseline creatinine is around 4 similar admission creatinine was 6.5.  Treated with IV fluids.  Creatinine is 4.29 today which is almost close to baseline.  Monitor. -Bicarbonate worsening to 15 today.  Currently on sodium bicarbonate supplementation. -Discussed with Dr. Bhandari/nephrology will see the patient today in follow-up  Hyperkalemia -Potassium still at 5.8 today.  Continue Lokelma -Repeat a.m. potassium -Potassium restricted diet  Chronic diarrhea -Improving.  Continue colestyramine  Chronic A. fib/flutter status post ablation -Continue propafenone  Hypertension Continue amlodipine  Hyperlipidemia  -continue statin  Moderate protein calorie malnutrition -Continue to follow nutrition recommendations  Iron deficiency anemia along with anemia of chronic disease -Continue iron supplementation    DVT prophylaxis: Heparin Code Status: Full Family Communication: None at bedside Disposition Plan: Status is: Inpatient  Remains inpatient appropriate because:Inpatient level of care appropriate  due to severity of illness   Dispo: The patient is from: Home              Anticipated d/c is to: Home in 1 to 2 days if potassium normalizes and patient remained stable              Patient currently is not medically stable to d/c.   Difficult to place patient No  Consultants: Nephrology  Procedures: None  Antimicrobials:  Rocephin and Zithromax day #4 today  Subjective: Patient seen and examined at bedside.  No overnight fever, vomiting, abdominal pain or diarrhea reported  Objective: Vitals:   06/29/20 0035 06/29/20 1423 06/29/20 2053 06/30/20 0559  BP: 133/70 (!) 116/58 (!) 159/73 (!) 165/70  Pulse: 92 85 95 90  Resp: '18 20 18 18  '$ Temp: 98.2 F (36.8 C) 97.7 F (36.5 C) 98 F (36.7 C) 98.1 F (36.7 C)  TempSrc: Oral Oral Oral Oral  SpO2: 97% 97% 97% 99%  Weight:        Intake/Output Summary (Last 24 hours) at 06/30/2020 0720 Last data filed at 06/30/2020 0500 Gross per 24 hour  Intake 2640 ml  Output --  Net 2640 ml   Filed Weights   06/25/20 0742 06/28/20 0648  Weight: 51.9 kg 53.2 kg    Examination:  General exam: No acute distress.  Currently on room air. Respiratory system: Decreased breath sounds at bases bilaterally with no wheezing cardiovascular system: Rate controlled, S1-S2 heard Gastrointestinal system: Abdomen is slightly distended, soft and nontender.  Bowel sounds are heard  extremities: Mild lower extremity edema present; no clubbing Central nervous system: Awake and alert.  No focal neurological deficits.  Moves extremities  skin: No obvious ecchymosis/lesions Psychiatry: Affect is flat   Data Reviewed: I have personally reviewed following  labs and imaging studies  CBC: Recent Labs  Lab 06/23/20 1623 06/25/20 0122 06/26/20 0136 06/27/20 0050 06/28/20 0145 06/29/20 0221  WBC 8.5 12.2* 13.7* 13.6* 11.9* 12.3*  NEUTROABS 4.6  --  5.9 5.9  --  5.5  HGB 8.9* 8.4* 8.8* 7.7* 7.8* 7.9*  HCT 25.9* 24.4* 26.2* 23.4* 24.0* 24.3*  MCV 97.0  96.4 98.9 100.0 101.3* 103.0*  PLT 521* 596* 625* 561* 581* 0000000*   Basic Metabolic Panel: Recent Labs  Lab 06/24/20 0420 06/24/20 1158 06/25/20 1544 06/26/20 0136 06/27/20 0050 06/28/20 0145 06/28/20 1644 06/29/20 0221 06/30/20 0212  NA 140   < >  --  136 133* 135  --  138 136  K 2.7*   < >  --  5.3* 5.8* 6.0* 5.5* 5.6* 5.8*  CL 112*   < >  --  108 106 112*  --  112* 110  CO2 20*   < >  --  21* 20* 17*  --  18* 15*  GLUCOSE 89   < >  --  95 103* 88  --  95 91  BUN 30*   < >  --  33* 43* 52*  --  46* 45*  CREATININE 5.20*   < >  --  4.14* 4.29* 4.18*  --  4.21* 4.29*  CALCIUM 7.5*   < >  --  8.8* 8.8* 9.1  --  9.2 9.6  MG 1.2*  --  1.2* 2.8*  --   --   --   --   --   PHOS 3.9  --   --   --   --  6.9*  --   --   --    < > = values in this interval not displayed.   GFR: Estimated Creatinine Clearance: 10.2 mL/min (A) (by C-G formula based on SCr of 4.29 mg/dL (H)). Liver Function Tests: Recent Labs  Lab 06/24/20 0420 06/24/20 1035 06/28/20 0145  AST  --  15 16  ALT  --  14 13  ALKPHOS  --  85 90  BILITOT  --  0.4 0.3  PROT  --  5.6* 5.8*  ALBUMIN 2.1* 2.3* 2.3*   No results for input(s): LIPASE, AMYLASE in the last 168 hours. No results for input(s): AMMONIA in the last 168 hours. Coagulation Profile: No results for input(s): INR, PROTIME in the last 168 hours. Cardiac Enzymes: No results for input(s): CKTOTAL, CKMB, CKMBINDEX, TROPONINI in the last 168 hours. BNP (last 3 results) No results for input(s): PROBNP in the last 8760 hours. HbA1C: No results for input(s): HGBA1C in the last 72 hours. CBG: No results for input(s): GLUCAP in the last 168 hours. Lipid Profile: No results for input(s): CHOL, HDL, LDLCALC, TRIG, CHOLHDL, LDLDIRECT in the last 72 hours. Thyroid Function Tests: No results for input(s): TSH, T4TOTAL, FREET4, T3FREE, THYROIDAB in the last 72 hours. Anemia Panel: No results for input(s): VITAMINB12, FOLATE, FERRITIN, TIBC, IRON, RETICCTPCT in  the last 72 hours. Sepsis Labs: Recent Labs  Lab 06/25/20 1544 06/26/20 0136 06/27/20 0050  PROCALCITON 0.24 0.24 0.22  LATICACIDVEN  --   --  0.7    Recent Results (from the past 240 hour(s))  SARS CORONAVIRUS 2 (TAT 6-24 HRS) Nasopharyngeal Nasopharyngeal Swab     Status: None   Collection Time: 06/23/20  4:30 PM   Specimen: Nasopharyngeal Swab  Result Value Ref Range Status   SARS Coronavirus 2 NEGATIVE NEGATIVE Final    Comment: (NOTE) SARS-CoV-2 target  nucleic acids are NOT DETECTED.  The SARS-CoV-2 RNA is generally detectable in upper and lower respiratory specimens during the acute phase of infection. Negative results do not preclude SARS-CoV-2 infection, do not rule out co-infections with other pathogens, and should not be used as the sole basis for treatment or other patient management decisions. Negative results must be combined with clinical observations, patient history, and epidemiological information. The expected result is Negative.  Fact Sheet for Patients: SugarRoll.be  Fact Sheet for Healthcare Providers: https://www.woods-mathews.com/  This test is not yet approved or cleared by the [place] FDA and  has been authorized for detection and/or diagnosis of SARS-CoV-2 by FDA under an Emergency Use Authorization (EUA). This EUA will remain  in effect (meaning this test can be used) for the duration of the COVID-19 declaration under Se ction 564(b)(1) of the Act, 21 U.S.C. section 360bbb-3(b)(1), unless the authorization is terminated or revoked sooner.  Performed at [facility], [place] [address], [place], [place] [address]   Culture, blood (routine x 2)     Status: None (Preliminary result)   Collection Time: 06/26/20  4:08 PM   Specimen: BLOOD RIGHT ARM  Result Value Ref Range Status   Specimen Description BLOOD RIGHT ARM  Final   Special Requests   Final    BOTTLES DRAWN AEROBIC AND ANAEROBIC Blood  Culture adequate volume   Culture   Final    NO GROWTH 3 DAYS Performed at [facility], [place] [address], [place], [place] [address]    Report Status PENDING  Incomplete  Culture, blood (routine x 2)     Status: None (Preliminary result)   Collection Time: 06/26/20  4:10 PM   Specimen: BLOOD LEFT HAND  Result Value Ref Range Status   Specimen Description BLOOD LEFT HAND  Final   Special Requests   Final    BOTTLES DRAWN AEROBIC AND ANAEROBIC Blood Culture adequate volume   Culture   Final    NO GROWTH 3 DAYS Performed at [facility], [place] [address], [place], [place] [address]    Report Status PENDING  Incomplete  Respiratory (~20 pathogens) panel by PCR     Status: None   Collection Time: 06/26/20  4:21 PM   Specimen: Nasopharyngeal Swab; Respiratory  Result Value Ref Range Status   Adenovirus NOT DETECTED NOT DETECTED Final   Coronavirus 229E NOT DETECTED NOT DETECTED Final    Comment: (NOTE) The Coronavirus on the Respiratory Panel, DOES NOT test for the novel  Coronavirus (2019 nCoV)    Coronavirus HKU1 NOT DETECTED NOT DETECTED Final   Coronavirus NL63 NOT DETECTED NOT DETECTED Final   Coronavirus OC43 NOT DETECTED NOT DETECTED Final   Metapneumovirus NOT DETECTED NOT DETECTED Final   Rhinovirus / Enterovirus NOT DETECTED NOT DETECTED Final   Influenza A NOT DETECTED NOT DETECTED Final   Influenza B NOT DETECTED NOT DETECTED Final   Parainfluenza Virus 1 NOT DETECTED NOT DETECTED Final   Parainfluenza Virus 2 NOT DETECTED NOT DETECTED Final   Parainfluenza Virus 3 NOT DETECTED NOT DETECTED Final   Parainfluenza Virus 4 NOT DETECTED NOT DETECTED Final   Respiratory Syncytial Virus NOT DETECTED NOT DETECTED Final   Bordetella pertussis NOT DETECTED NOT DETECTED Final   Bordetella Parapertussis NOT DETECTED NOT DETECTED Final   Chlamydophila pneumoniae NOT DETECTED NOT DETECTED Final   Mycoplasma pneumoniae NOT DETECTED NOT DETECTED Final    Comment: Performed  at [facility], [address], [place], [place] [address]         Radiology Studies: No results found.      Scheduled Meds: . amLODipine  5 mg Oral Daily  . calcitRIOL  0.25 mcg Oral Daily  . cholestyramine light  4 g Oral Daily  . ferrous sulfate  325 mg Oral BID WC  . guaiFENesin  600 mg Oral BID  . heparin injection (subcutaneous)  5,000 Units Subcutaneous Q12H  . pravastatin  20 mg Oral q1800  . propafenone  150 mg Oral Q12H  . sodium bicarbonate  1,300 mg Oral TID  . sodium zirconium cyclosilicate  10 g Oral Daily   Continuous Infusions: . azithromycin 500 mg (06/29/20 0946)  . cefTRIAXone (ROCEPHIN)  IV 1 g (06/29/20 0805)          Aline August, MD [facility] 06/30/2020, 7:20 AM

## 2020-07-01 ENCOUNTER — Other Ambulatory Visit (HOSPITAL_COMMUNITY): Payer: Self-pay

## 2020-07-01 LAB — CBC WITH DIFFERENTIAL/PLATELET
Abs Immature Granulocytes: 0.11 10*3/uL — ABNORMAL HIGH (ref 0.00–0.07)
Basophils Absolute: 0.1 10*3/uL (ref 0.0–0.1)
Basophils Relative: 1 %
Eosinophils Absolute: 0.7 10*3/uL — ABNORMAL HIGH (ref 0.0–0.5)
Eosinophils Relative: 5 %
HCT: 24.3 % — ABNORMAL LOW (ref 36.0–46.0)
Hemoglobin: 7.8 g/dL — ABNORMAL LOW (ref 12.0–15.0)
Immature Granulocytes: 1 %
Lymphocytes Relative: 32 %
Lymphs Abs: 4.4 10*3/uL — ABNORMAL HIGH (ref 0.7–4.0)
MCH: 33.1 pg (ref 26.0–34.0)
MCHC: 32.1 g/dL (ref 30.0–36.0)
MCV: 103 fL — ABNORMAL HIGH (ref 80.0–100.0)
Monocytes Absolute: 1.4 10*3/uL — ABNORMAL HIGH (ref 0.1–1.0)
Monocytes Relative: 11 %
Neutro Abs: 6.8 10*3/uL (ref 1.7–7.7)
Neutrophils Relative %: 50 %
Platelets: 545 10*3/uL — ABNORMAL HIGH (ref 150–400)
RBC: 2.36 MIL/uL — ABNORMAL LOW (ref 3.87–5.11)
RDW: 15.2 % (ref 11.5–15.5)
WBC: 13.5 10*3/uL — ABNORMAL HIGH (ref 4.0–10.5)
nRBC: 0 % (ref 0.0–0.2)

## 2020-07-01 LAB — CULTURE, BLOOD (ROUTINE X 2)
Culture: NO GROWTH
Culture: NO GROWTH
Special Requests: ADEQUATE
Special Requests: ADEQUATE

## 2020-07-01 LAB — BASIC METABOLIC PANEL
Anion gap: 10 (ref 5–15)
BUN: 38 mg/dL — ABNORMAL HIGH (ref 8–23)
CO2: 22 mmol/L (ref 22–32)
Calcium: 9.5 mg/dL (ref 8.9–10.3)
Chloride: 106 mmol/L (ref 98–111)
Creatinine, Ser: 4.21 mg/dL — ABNORMAL HIGH (ref 0.44–1.00)
GFR, Estimated: 11 mL/min — ABNORMAL LOW (ref >60)
Glucose, Bld: 75 mg/dL (ref 70–99)
Potassium: 5.4 mmol/L — ABNORMAL HIGH (ref 3.5–5.1)
Sodium: 138 mmol/L (ref 135–145)

## 2020-07-01 LAB — RENAL FUNCTION PANEL
Albumin: 2.5 g/dL — ABNORMAL LOW (ref 3.5–5.0)
Anion gap: 6 (ref 5–15)
BUN: 38 mg/dL — ABNORMAL HIGH (ref 8–23)
CO2: 22 mmol/L (ref 22–32)
Calcium: 9.2 mg/dL (ref 8.9–10.3)
Chloride: 110 mmol/L (ref 98–111)
Creatinine, Ser: 4.15 mg/dL — ABNORMAL HIGH (ref 0.44–1.00)
GFR, Estimated: 11 mL/min — ABNORMAL LOW (ref >60)
Glucose, Bld: 72 mg/dL (ref 70–99)
Phosphorus: 6.7 mg/dL — ABNORMAL HIGH (ref 2.5–4.6)
Potassium: 5.4 mmol/L — ABNORMAL HIGH (ref 3.5–5.1)
Sodium: 138 mmol/L (ref 135–145)

## 2020-07-01 LAB — MAGNESIUM: Magnesium: 1.5 mg/dL — ABNORMAL LOW (ref 1.7–2.4)

## 2020-07-01 MED ORDER — AZITHROMYCIN 250 MG PO TABS
500.0000 mg | ORAL_TABLET | Freq: Once | ORAL | Status: AC
  Administered 2020-07-01: 500 mg via ORAL
  Filled 2020-07-01: qty 2

## 2020-07-01 MED ORDER — SODIUM CHLORIDE 0.9 % IV SOLN
2.0000 g | INTRAVENOUS | Status: AC
  Administered 2020-07-01: 2 g via INTRAVENOUS
  Filled 2020-07-01: qty 20

## 2020-07-01 MED ORDER — MAGNESIUM SULFATE 2 GM/50ML IV SOLN
2.0000 g | Freq: Once | INTRAVENOUS | Status: AC
  Administered 2020-07-01: 2 g via INTRAVENOUS
  Filled 2020-07-01: qty 50

## 2020-07-01 MED ORDER — SODIUM ZIRCONIUM CYCLOSILICATE 10 G PO PACK
10.0000 g | PACK | Freq: Every day | ORAL | 0 refills | Status: AC
  Filled 2020-07-01: qty 7, 7d supply, fill #0

## 2020-07-01 NOTE — Discharge Summary (Signed)
Physician Discharge Summary  Mariabelen Chugh [id number] DOB: 1950/09/09 DOA: 06/23/2020  PCP: Prince Solian, MD  Admit date: 06/23/2020 Discharge date: 07/01/2020 30 Day Unplanned Readmission Risk Score   Flowsheet Row ED to Hosp-Admission (Current) from 06/23/2020 in [facility]  30 Day Unplanned Readmission Risk Score (%) 18.08 Filed at 07/01/2020 0801     This score is the patient's risk of an unplanned readmission within 30 days of being discharged (0 -100%). The score is based on dignosis, age, lab data, medications, orders, and past utilization.   Low:  0-14.9   Medium: 15-21.9   High: 22-29.9   Extreme: 30 and above         Admitted From: Home Disposition: Home  Recommendations for Outpatient Follow-up:  1. Follow up with PCP in 1-2 weeks 2. Follow-up with your nephrologist on 07/06/2020 3. Please obtain BMP/CBC in one week 4. Please follow up with your PCP on the following pending results: Unresulted Labs (From admission, onward)          Start     Ordered   07/01/20 0700  Renal function panel  Daily,   R     Question:  Specimen collection method  Answer:  Lab=Lab collect   06/30/20 1006   Unscheduled  Occult blood card to lab, stool  As needed,   R      06/25/20 1015   Signed and Held  CBC  (heparin)  Once,   R       Comments: Baseline for heparin therapy IF NOT ALREADY DRAWN.  Notify MD if PLT < 100 K.    Signed and Held   Signed and Held  Creatinine, serum  (heparin)  Once,   R       Comments: Baseline for heparin therapy IF NOT ALREADY DRAWN.    Signed and Held   Signed and Held  CBC  Tomorrow morning,   R        Signed and Held   Signed and Held  Iron and TIBC  Once,   R        Signed and Held            Home Health: None Equipment/Devices: None  Discharge Condition: Stable CODE STATUS: Full code Diet recommendation: Renal diet  Subjective: Seen and examined.  She has no complaints.  Wants to go home.  Brief/Interim Summary:  70 year old female was admitted in acute on chronic renal failure on 06/23/2020 secondary to ongoing nausea and vomiting thought to be secondary to viral illness. Admission creatinine was 6.5, baseline is around 4. Patient was treated in house with IV hydration and a creatinine has returned to baseline. Chest x-ray revealed possible infiltration.Ceftriaxone andazithromycin started on 06/27/2020 and was given for 5 days.  Nephrology evaluated the patient and has signed off.  She was treated for hyperkalemia.  She then developed hypokalemia.  She once again developed resistant hyperkalemia.  Nephrology was reconsulted.  She was given Lokelma.  She also was found to have metabolic acidosis with low CO2 so she was started on bicarb.  Her bicarb improved.  Her potassium is still is 5.4, no change compared to yesterday however per nephrology note, patient tends to eat high potassium food and per nephrology, patient's potassium should improved with improvement of CO2.  They have cleared the patient for discharge with recommendations for 7 days of oral Lokelma.  She already has follow-up with her nephrologist within a week.  Patient has been  walking in the halls without any problem and she has no complaints and she was to go home.  She is going to be discharged today in stable condition.  Discharge Diagnoses:  Active Problems:   AKI (acute kidney injury) [facility])    Discharge Instructions  Discharge Instructions    Diet general   Complete by: As directed    Renal US   Increase activity slowly   Complete by: As directed      Allergies as of 07/01/2020      Reactions   Codeine Itching      Medication List    TAKE these medications   acetaminophen 500 MG tablet Commonly known as: TYLENOL Take 1,000 mg by mouth every 6 (six) hours as needed for mild pain.   amLODipine 5 MG tablet Commonly known as: NORVASC Take by mouth daily in the afternoon.   calcitRIOL 0.25 MCG capsule Commonly known as:  ROCALTROL Take 1 capsule (0.25 mcg total) by mouth daily.   ferrous sulfate 325 (65 FE) MG tablet Take 1 tablet (325 mg total) by mouth daily with breakfast.   Potassium Chloride ER 20 MEQ Tbcr Take 20 mEq by mouth daily for 3 days.   pravastatin 20 MG tablet Commonly known as: PRAVACHOL Take 20 mg by mouth daily.   promethazine 25 MG tablet Commonly known as: PHENERGAN Take 25 mg by mouth every 8 (eight) hours as needed for nausea.   propafenone 150 MG tablet Commonly known as: RYTHMOL TAKE 1 TABLET BY MOUTH TWICE DAILY. What changed: when to take this   sodium bicarbonate 650 MG tablet Take 2 tablets (1,300 mg total) by mouth 3 (three) times daily.   sodium zirconium cyclosilicate 10 g Pack packet Commonly known as: LOKELMA Take 10 g by mouth daily for 7 days. Start taking on: July 02, 2020       Follow-up Information    Avva, Ravisankar, MD Follow up in 1 week(s).   Specialty: Internal Medicine Why: for hospital discharge follow up, repeat basic labs to monitor anemia , kidneys function and electrolte levels Contact information: [place] [address] [phone]        Justin Mend, MD Follow up on 07/06/2020.   Specialty: Internal Medicine Contact information: [address] [place] [address] [phone]              Allergies  Allergen Reactions  . Codeine Itching    Consultations: Nephrology   Procedures/Studies: DG Chest 2 View  Result Date: 06/25/2020 CLINICAL DATA:  Cough. EXAM: CHEST - 2 VIEW COMPARISON:  Chest x-ray 10/03/2019. FINDINGS: Mediastinum hilar structures normal. Cardiomegaly. Mild right base atelectasis/infiltrate cannot be excluded. No pleural effusion or pneumothorax. Degenerative change thoracic spine. Surgical clips upper abdomen. IMPRESSION: 1.  Cardiomegaly.  No pulmonary venous congestion. 2.  Mild right base atelectasis/infiltrate cannot be excluded. Electronically Signed   By: Marcello Moores  Register    On: 06/25/2020 12:54   CT CHEST WO CONTRAST  Result Date: 06/27/2020 CLINICAL DATA:  Cough. EXAM: CT CHEST WITHOUT CONTRAST TECHNIQUE: Multidetector CT imaging of the chest was performed following the standard protocol without IV contrast. COMPARISON:  Chest radiograph 06/25/2020 FINDINGS: Cardiovascular: Normal heart size. No pericardial effusions. Mild coronary artery calcification. Calcification of the aorta. No aneurysm. Mediastinum/Nodes: Esophagus is decompressed. Scattered lymph nodes in the mediastinum are not pathologically enlarged. Thyroid gland is unremarkable. Lungs/Pleura: Mild scarring in the lung apices. Scattered patchy areas of subpleural fibrosis or infiltration. Motion artifact in the  bases limits evaluation but there appears to be patchy airspace disease with a nodular configuration in the bases, greatest on the left. This may indicate early pneumonia. Airways are patent. No pleural effusions. No pneumothorax. Upper Abdomen: Surgical absence of the gallbladder. No acute changes demonstrated in the visualized upper abdomen. Musculoskeletal: Degenerative changes in the spine. No destructive bone lesions. IMPRESSION: 1. Patchy airspace disease with a nodular configuration in the bases, greatest on the left. This may indicate early pneumonia. 2. Scattered patchy areas of subpleural fibrosis or infiltration. 3. Aortic atherosclerosis. Aortic Atherosclerosis (ICD10-I70.0). Electronically Signed   By: Lucienne Capers M.D.   On: 06/27/2020 22:18   US RENAL  Result Date: 06/23/2020 CLINICAL DATA:  Acute on chronic kidney injury. EXAM: RENAL / URINARY TRACT ULTRASOUND COMPLETE COMPARISON:  CT abdomen pelvis dated October 03, 2019. Renal ultrasound dated March 11, 2019. FINDINGS: Right Kidney: Renal measurements: 10.1 x 3.7 x 4.8 cm = volume: 93 mL. Unchanged increased echogenicity. No mass or hydronephrosis visualized. Left Kidney: Renal measurements: 9.9 x 4.7 x 3.8 cm = volume: 82 mL. Unchanged  increased echogenicity. No mass or hydronephrosis visualized. Bladder: Appears normal for degree of bladder distention. Other: None. IMPRESSION: 1. No acute abnormality. 2. Unchanged increased bilateral renal echogenicity, consistent with medical renal disease. Electronically Signed   By: Titus Dubin M.D.   On: 06/23/2020 16:14   [place] EKG SITE RITE  Result Date: 06/24/2020 If Site Rite image not attached, placement could not be confirmed due to current cardiac rhythm.     Discharge Exam: Vitals:   06/30/20 2116 07/01/20 0425  BP: (!) 143/71 123/68  Pulse: 91 90  Resp: 18 16  Temp: 98.2 F (36.8 C) 98 F (36.7 C)  SpO2: 96% 97%   Vitals:   06/30/20 0559 06/30/20 1416 06/30/20 2116 07/01/20 0425  BP: (!) 165/70 132/60 (!) 143/71 123/68  Pulse: 90 89 91 90  Resp: '18 16 18 16  '$ Temp: 98.1 F (36.7 C) 98.4 F (36.9 C) 98.2 F (36.8 C) 98 F (36.7 C)  TempSrc: Oral Oral  Axillary  SpO2: 99% 97% 96% 97%  Weight:        General: Pt is alert, awake, not in acute distress Cardiovascular: RRR, S1/S2 +, no rubs, no gallops Respiratory: CTA bilaterally, no wheezing, no rhonchi Abdominal: Soft, NT, ND, bowel sounds + Extremities: no edema, no cyanosis    The results of significant diagnostics from this hospitalization (including imaging, microbiology, ancillary and laboratory) are listed below for reference.     Microbiology: Recent Results (from the past 240 hour(s))  SARS CORONAVIRUS 2 (TAT 6-24 HRS) Nasopharyngeal Nasopharyngeal Swab     Status: None   Collection Time: 06/23/20  4:30 PM   Specimen: Nasopharyngeal Swab  Result Value Ref Range Status   SARS Coronavirus 2 NEGATIVE NEGATIVE Final    Comment: (NOTE) SARS-CoV-2 target nucleic acids are NOT DETECTED.  The SARS-CoV-2 RNA is generally detectable in upper and lower respiratory specimens during the acute phase of infection. Negative results do not preclude SARS-CoV-2 infection, do not rule out co-infections with  other pathogens, and should not be used as the sole basis for treatment or other patient management decisions. Negative results must be combined with clinical observations, patient history, and epidemiological information. The expected result is Negative.  Fact Sheet for Patients: SugarRoll.be  Fact Sheet for Healthcare Providers: https://www.woods-mathews.com/  This test is not yet approved or cleared by the [place] FDA and  has been authorized for  detection and/or diagnosis of SARS-CoV-2 by FDA under an Emergency Use Authorization (EUA). This EUA will remain  in effect (meaning this test can be used) for the duration of the COVID-19 declaration under Se ction 564(b)(1) of the Act, 21 U.S.C. section 360bbb-3(b)(1), unless the authorization is terminated or revoked sooner.  Performed at [facility], [place] [address] [place], [place] [address]   Culture, blood (routine x 2)     Status: None   Collection Time: 06/26/20  4:08 PM   Specimen: BLOOD RIGHT ARM  Result Value Ref Range Status   Specimen Description BLOOD RIGHT ARM  Final   Special Requests   Final    BOTTLES DRAWN AEROBIC AND ANAEROBIC Blood Culture adequate volume   Culture   Final    NO GROWTH 5 DAYS Performed at [facility], [place] [address], [place], [place] [address]    Report Status 07/01/2020 FINAL  Final  Culture, blood (routine x 2)     Status: None   Collection Time: 06/26/20  4:10 PM   Specimen: BLOOD LEFT HAND  Result Value Ref Range Status   Specimen Description BLOOD LEFT HAND  Final   Special Requests   Final    BOTTLES DRAWN AEROBIC AND ANAEROBIC Blood Culture adequate volume   Culture   Final    NO GROWTH 5 DAYS Performed at [facility], [place] [address], [place], [place] [address]    Report Status 07/01/2020 FINAL  Final  Respiratory (~20 pathogens) panel by PCR     Status: None   Collection Time: 06/26/20  4:21 PM   Specimen:  Nasopharyngeal Swab; Respiratory  Result Value Ref Range Status   Adenovirus NOT DETECTED NOT DETECTED Final   Coronavirus 229E NOT DETECTED NOT DETECTED Final    Comment: (NOTE) The Coronavirus on the Respiratory Panel, DOES NOT test for the novel  Coronavirus (2019 nCoV)    Coronavirus HKU1 NOT DETECTED NOT DETECTED Final   Coronavirus NL63 NOT DETECTED NOT DETECTED Final   Coronavirus OC43 NOT DETECTED NOT DETECTED Final   Metapneumovirus NOT DETECTED NOT DETECTED Final   Rhinovirus / Enterovirus NOT DETECTED NOT DETECTED Final   Influenza A NOT DETECTED NOT DETECTED Final   Influenza B NOT DETECTED NOT DETECTED Final   Parainfluenza Virus 1 NOT DETECTED NOT DETECTED Final   Parainfluenza Virus 2 NOT DETECTED NOT DETECTED Final   Parainfluenza Virus 3 NOT DETECTED NOT DETECTED Final   Parainfluenza Virus 4 NOT DETECTED NOT DETECTED Final   Respiratory Syncytial Virus NOT DETECTED NOT DETECTED Final   Bordetella pertussis NOT DETECTED NOT DETECTED Final   Bordetella Parapertussis NOT DETECTED NOT DETECTED Final   Chlamydophila pneumoniae NOT DETECTED NOT DETECTED Final   Mycoplasma pneumoniae NOT DETECTED NOT DETECTED Final    Comment: Performed at [facility], [place] [address] [place],  [address]     Labs: BNP (last 3 results) No results for input(s): BNP in the last 8760 hours. Basic Metabolic Panel: Recent Labs  Lab 06/25/20 1544 06/26/20 0136 06/27/20 0050 06/28/20 0145 06/28/20 1644 06/29/20 0221 06/30/20 0212 07/01/20 0110  NA  --  136 133* 135  --  138 136 138  138  K  --  5.3* 5.8* 6.0* 5.5* 5.6* 5.8* 5.4*  5.4*  CL  --  108 106 112*  --  112* 110 110  106  CO2  --  21* 20* 17*  --  18* 15* 22  22  GLUCOSE  --  95 103* 88  --  95 91 72  75  BUN  --  33* 43* 52*  --  46* 45* 38*  38*  CREATININE  --  4.14* 4.29* 4.18*  --  4.21* 4.29* 4.15*  4.21*  CALCIUM  --  8.8* 8.8* 9.1  --  9.2 9.6 9.2  9.5  MG 1.2* 2.8*  --   --   --   --   --   1.5*  PHOS  --   --   --  6.9*  --   --   --  6.7*   Liver Function Tests: Recent Labs  Lab 06/24/20 1035 06/28/20 0145 07/01/20 0110  AST 15 16  --   ALT 14 13  --   ALKPHOS 85 90  --   BILITOT 0.4 0.3  --   PROT 5.6* 5.8*  --   ALBUMIN 2.3* 2.3* 2.5*   No results for input(s): LIPASE, AMYLASE in the last 168 hours. No results for input(s): AMMONIA in the last 168 hours. CBC: Recent Labs  Lab 06/26/20 0136 06/27/20 0050 06/28/20 0145 06/29/20 0221 07/01/20 0110  WBC 13.7* 13.6* 11.9* 12.3* 13.5*  NEUTROABS 5.9 5.9  --  5.5 6.8  HGB 8.8* 7.7* 7.8* 7.9* 7.8*  HCT 26.2* 23.4* 24.0* 24.3* 24.3*  MCV 98.9 100.0 101.3* 103.0* 103.0*  PLT 625* 561* 581* 572* 545*   Cardiac Enzymes: No results for input(s): CKTOTAL, CKMB, CKMBINDEX, TROPONINI in the last 168 hours. BNP: Invalid input(s): POCBNP CBG: No results for input(s): GLUCAP in the last 168 hours. D-Dimer No results for input(s): DDIMER in the last 72 hours. Hgb A1c No results for input(s): HGBA1C in the last 72 hours. Lipid Profile No results for input(s): CHOL, HDL, LDLCALC, TRIG, CHOLHDL, LDLDIRECT in the last 72 hours. Thyroid function studies No results for input(s): TSH, T4TOTAL, T3FREE, THYROIDAB in the last 72 hours.  Invalid input(s): FREET3 Anemia work up No results for input(s): VITAMINB12, FOLATE, FERRITIN, TIBC, IRON, RETICCTPCT in the last 72 hours. Urinalysis    Component Value Date/Time   COLORURINE STRAW (A) 06/23/2020 1452   APPEARANCEUR CLEAR 06/23/2020 1452   LABSPEC 1.004 (L) 06/23/2020 1452   PHURINE 6.0 06/23/2020 1452   GLUCOSEU NEGATIVE 06/23/2020 1452   HGBUR SMALL (A) 06/23/2020 1452   BILIRUBINUR NEGATIVE 06/23/2020 1452   KETONESUR NEGATIVE 06/23/2020 1452   PROTEINUR 30 (A) 06/23/2020 1452   UROBILINOGEN 0.2 02/19/2009 1839   NITRITE NEGATIVE 06/23/2020 1452   LEUKOCYTESUR NEGATIVE 06/23/2020 1452   Sepsis Labs Invalid input(s): PROCALCITONIN,  WBC,   LACTICIDVEN Microbiology Recent Results (from the past 240 hour(s))  SARS CORONAVIRUS 2 (TAT 6-24 HRS) Nasopharyngeal Nasopharyngeal Swab     Status: None   Collection Time: 06/23/20  4:30 PM   Specimen: Nasopharyngeal Swab  Result Value Ref Range Status   SARS Coronavirus 2 NEGATIVE NEGATIVE Final    Comment: (NOTE) SARS-CoV-2 target nucleic acids are NOT DETECTED.  The SARS-CoV-2 RNA is generally detectable in upper and lower respiratory specimens during the acute phase of infection. Negative results do not preclude SARS-CoV-2 infection, do not rule out co-infections with other pathogens, and should not be used as the sole basis for treatment or other patient management decisions. Negative results must be combined with clinical observations, patient history, and epidemiological information. The expected result is Negative.  Fact Sheet for Patients: SugarRoll.be  Fact Sheet for Healthcare Providers: https://www.woods-mathews.com/  This test is not yet approved or  cleared by the [place] and  has been authorized for detection and/or diagnosis of SARS-CoV-2 by FDA under an Emergency Use Authorization (EUA). This EUA will remain  in effect (meaning this test can be used) for the duration of the COVID-19 declaration under Se ction 564(b)(1) of the Act, 21 U.S.C. section 360bbb-3(b)(1), unless the authorization is terminated or revoked sooner.  Performed at [facility], [place] [address] [place], [place] [address]   Culture, blood (routine x 2)     Status: None   Collection Time: 06/26/20  4:08 PM   Specimen: BLOOD RIGHT ARM  Result Value Ref Range Status   Specimen Description BLOOD RIGHT ARM  Final   Special Requests   Final    BOTTLES DRAWN AEROBIC AND ANAEROBIC Blood Culture adequate volume   Culture   Final    NO GROWTH 5 DAYS Performed at [facility], [place] [address] [place], [place] [address]    Report  Status 07/01/2020 FINAL  Final  Culture, blood (routine x 2)     Status: None   Collection Time: 06/26/20  4:10 PM   Specimen: BLOOD LEFT HAND  Result Value Ref Range Status   Specimen Description BLOOD LEFT HAND  Final   Special Requests   Final    BOTTLES DRAWN AEROBIC AND ANAEROBIC Blood Culture adequate volume   Culture   Final    NO GROWTH 5 DAYS Performed at [facility], [place] [address] [place], [place] [address]    Report Status 07/01/2020 FINAL  Final  Respiratory (~20 pathogens) panel by PCR     Status: None   Collection Time: 06/26/20  4:21 PM   Specimen: Nasopharyngeal Swab; Respiratory  Result Value Ref Range Status   Adenovirus NOT DETECTED NOT DETECTED Final   Coronavirus 229E NOT DETECTED NOT DETECTED Final    Comment: (NOTE) The Coronavirus on the Respiratory Panel, DOES NOT test for the novel  Coronavirus (2019 nCoV)    Coronavirus HKU1 NOT DETECTED NOT DETECTED Final   Coronavirus NL63 NOT DETECTED NOT DETECTED Final   Coronavirus OC43 NOT DETECTED NOT DETECTED Final   Metapneumovirus NOT DETECTED NOT DETECTED Final   Rhinovirus / Enterovirus NOT DETECTED NOT DETECTED Final   Influenza A NOT DETECTED NOT DETECTED Final   Influenza B NOT DETECTED NOT DETECTED Final   Parainfluenza Virus 1 NOT DETECTED NOT DETECTED Final   Parainfluenza Virus 2 NOT DETECTED NOT DETECTED Final   Parainfluenza Virus 3 NOT DETECTED NOT DETECTED Final   Parainfluenza Virus 4 NOT DETECTED NOT DETECTED Final   Respiratory Syncytial Virus NOT DETECTED NOT DETECTED Final   Bordetella pertussis NOT DETECTED NOT DETECTED Final   Bordetella Parapertussis NOT DETECTED NOT DETECTED Final   Chlamydophila pneumoniae NOT DETECTED NOT DETECTED Final   Mycoplasma pneumoniae NOT DETECTED NOT DETECTED Final    Comment: Performed at [facility], [place] [address], [place],  [address]     Time coordinating discharge: Over 30 minutes  SIGNED:   Darliss Cheney, MD  [facility] 07/01/2020, 10:00 AM  If 7PM-7AM, please contact night-coverage [email or web address]

## 2020-07-01 NOTE — Plan of Care (Signed)

## 2020-07-01 NOTE — Progress Notes (Signed)
[facility] Progress Note    Assessment/ Plan:    Pt is a 70 year old lady wide-complex tachycardia, SLE, stage IV/V chronic kidney disease.  She has been followed with Dr. Johnney Ou at [facility].    She was admitted 06/23/2020 with 6-day history of refractory nausea with vomiting.  And dry heaves.  In the emergency room creatinine was 6.5 mg/dL.  1.  AKI on CKD V - Her baseline creatinine appears to be around about 4 mg/dL.  Cr 4.15 today near baseline. Follow up with Dr. Johnney Ou outpatient. When needed, she in interested in starting peritoneal dialysis. Patient can be discharged. We will sign off.  2.  Hyperkalemia - Likely due to extracellular shift of K in the setting of metabolic acidosis.K 5.4 this morning. Lokelma 10g today.  3.  Metabolic acidosis- resolved with bicarb infusion. Contine TID sodium bicarb tablets. 4.  Bone mineral. Continue calcitriol 0.25 mcg. 5.  Anemia- Continue BID iron supplements until iron stores repleted then start ESA. 6.  HTN - On amlodipine. Plan per primary. 7   HLD - per pmd 8.  H/o SLE 9.  Hx SVT - on propafenone    Subjective:   Patient without complaints. No significant overnight events.    Objective:   BP 123/68 (BP Location: Right Arm)   Pulse 90   Temp 98 F (36.7 C) (Axillary)   Resp 16   Wt 53.2 kg   SpO2 97%   BMI 20.78 kg/m   Intake/Output Summary (Last 24 hours) at 07/01/2020 0830 Last data filed at 07/01/2020 [id number] Gross per 24 hour  Intake 3295.07 ml  Output --  Net 3295.07 ml   Weight change:   Physical Exam: GEN: pleasant older female, well appearing, in no acute distress  CV: regular rate and rhythm RESP: no increased work of breathing, bibasilar rales  ABD: Bowel sounds present. Soft, Nontender, Nondistended.  MSK: no LE edema SKIN: warm, dry     Imaging: No results found.  Labs: BMET Recent Labs  Lab 06/25/20 0122 06/26/20 0136 06/27/20 0050 06/28/20 0145 06/28/20 1644  06/29/20 0221 06/30/20 0212 07/01/20 0110  NA 137 136 133* 135  --  138 136 138  138  K 3.3* 5.3* 5.8* 6.0* 5.5* 5.6* 5.8* 5.4*  5.4*  CL 111 108 106 112*  --  112* 110 110  106  CO2 20* 21* 20* 17*  --  18* 15* 22  22  GLUCOSE 80 95 103* 88  --  95 91 72  75  BUN 33* 33* 43* 52*  --  46* 45* 38*  38*  CREATININE 4.82* 4.14* 4.29* 4.18*  --  4.21* 4.29* 4.15*  4.21*  CALCIUM 7.8* 8.8* 8.8* 9.1  --  9.2 9.6 9.2  9.5  PHOS  --   --   --  6.9*  --   --   --  6.7*   CBC Recent Labs  Lab 06/26/20 0136 06/27/20 0050 06/28/20 0145 06/29/20 0221 07/01/20 0110  WBC 13.7* 13.6* 11.9* 12.3* 13.5*  NEUTROABS 5.9 5.9  --  5.5 6.8  HGB 8.8* 7.7* 7.8* 7.9* 7.8*  HCT 26.2* 23.4* 24.0* 24.3* 24.3*  MCV 98.9 100.0 101.3* 103.0* 103.0*  PLT 625* 561* 581* 572* 545*    Medications:    . amLODipine  5 mg Oral Daily  . calcitRIOL  0.25 mcg Oral Daily  . cholestyramine light  4 g Oral Daily  . ferrous sulfate  325 mg Oral BID WC  .  guaiFENesin  600 mg Oral BID  . heparin injection (subcutaneous)  5,000 Units Subcutaneous Q12H  . pravastatin  20 mg Oral q1800  . propafenone  150 mg Oral Q12H  . sodium bicarbonate  1,300 mg Oral TID  . sodium zirconium cyclosilicate  10 g Oral Daily      Debra Vanzee, DO  07/01/2020, 8:30 AM

## 2020-07-01 NOTE — Plan of Care (Signed)

## 2020-07-01 NOTE — Progress Notes (Addendum)
PHARMACIST - PHYSICIAN COMMUNICATION DR:   Doristine Bosworth CONCERNING: Antibiotic IV to Oral Route Change Policy  RECOMMENDATION: This patient is receiving azithromycin by the intravenous route.  Based on criteria approved by the Pharmacy and Therapeutics Committee, the antibiotic(s) is/are being converted to the equivalent oral dose form(s).   DESCRIPTION: These criteria include:  Patient being treated for a respiratory tract infection, urinary tract infection, cellulitis or clostridium difficile associated diarrhea if on metronidazole  The patient is not neutropenic and does not exhibit a GI malabsorption state  The patient is eating (either orally or via tube) and/or has been taking other orally administered medications for a least 24 hours  The patient is improving clinically and has a Tmax < 100.5  If you have questions about this conversion, please contact the Pharmacy Department  '[]'$   [phone] )  [facility] '[]'$   [phone] )  [facility] '[x]'$   [phone] )  [facility] '[]'$   [phone] )  [facility] '[]'$   [phone] )  [place] to add stop date of 5d to ceftriaxone per Dr Doristine Bosworth.  Onnie Boer, PharmD, BCIDP, AAHIVP, CPP Infectious Disease Pharmacist 07/01/2020 8:54 AM

## 2020-07-01 NOTE — Evaluation (Signed)
Physical Therapy Evaluation Patient Details Name: Debra Petersen MRN: [id number] DOB: 11/29/1950 Today's Date: 07/01/2020   History of Present Illness  Jacara is a 70 y/o female who was sent to ED from her PCP's office due to nausea, vomiting, diarrhea, and dehydration. Pt admitted on 06/23/20 with AKI. CT also concerning for PNA. PMH includes CKD, HTN, HLD, lupus, and chronic A-fib.    Clinical Impression  Pt received in bed, cooperative and pleasant. Appeared to be at baseline level and independent with all mobility. Pt did express the want to strengthen her LEs and return to long-distance walking. PT discussed options to promote further activity and exercise. Pt demonstrated understanding. Do not anticipate pt requires further PT follow up. Signing off for now. Thank you for the opportunity to participate in this patient's care.    Follow Up Recommendations No PT follow up    Equipment Recommendations  None recommended by PT    Recommendations for Other Services       Precautions / Restrictions Restrictions Weight Bearing Restrictions: No      Mobility  Bed Mobility Overal bed mobility: Independent                  Transfers Overall transfer level: Independent Equipment used: None                Ambulation/Gait Ambulation/Gait assistance: Independent Gait Distance (Feet): 225 Feet Assistive device: None Gait Pattern/deviations: WFL(Within Functional Limits) Gait velocity: WFL      Stairs Stairs: Yes Stairs assistance: Independent Stair Management: One rail Left;Alternating pattern Number of Stairs: 3 General stair comments: No LOB  Wheelchair Mobility    Modified Rankin (Stroke Patients Only)       Balance Overall balance assessment: Independent                                           Pertinent Vitals/Pain Pain Assessment: No/denies pain    Home Living Family/patient expects to be discharged to:: Private  residence Living Arrangements: Alone Available Help at Discharge: Family;Friend(s) Type of Home: House Home Access: Stairs to enter Entrance Stairs-Rails: None Entrance Stairs-Number of Steps: 2 Home Layout: One level Home Equipment: None      Prior Function Level of Independence: Independent         Comments: Independent for ADLs. Community ambulator     Journalist, newspaper        Extremity/Trunk Assessment   Upper Extremity Assessment Upper Extremity Assessment: Defer to OT evaluation    Lower Extremity Assessment Lower Extremity Assessment: Overall WFL for tasks assessed    Cervical / Trunk Assessment Cervical / Trunk Assessment: Normal  Communication   Communication: No difficulties  Cognition Arousal/Alertness: Awake/alert Behavior During Therapy: WFL for tasks assessed/performed Overall Cognitive Status: Within Functional Limits for tasks assessed                                        General Comments      Exercises     Assessment/Plan    PT Assessment Patent does not need any further PT services  PT Problem List         PT Treatment Interventions      PT Goals (Current goals can be found in the Care Plan section)  Acute Rehab PT  Goals Patient Stated Goal: go home, get stronger PT Goal Formulation: With patient Time For Goal Achievement: 07/01/20 Potential to Achieve Goals: Good    Frequency     Barriers to discharge        Co-evaluation               AM-PAC PT "6 Clicks" Mobility  Outcome Measure Help needed turning from your back to your side while in a flat bed without using bedrails?: None Help needed moving from lying on your back to sitting on the side of a flat bed without using bedrails?: None Help needed moving to and from a bed to a chair (including a wheelchair)?: None Help needed standing up from a chair using your arms (e.g., wheelchair or bedside chair)?: None Help needed to walk in hospital room?:  None Help needed climbing 3-5 steps with a railing? : None 6 Click Score: 24    End of Session Equipment Utilized During Treatment: Gait belt Activity Tolerance: Patient tolerated treatment well Patient left: in chair;with call bell/phone within reach;Other (comment) (physician present) Nurse Communication: Mobility status PT Visit Diagnosis: Other abnormalities of gait and mobility (R26.89)    Time:  -      Charges:             Rosita Kea, SPT

## 2020-07-01 NOTE — Discharge Instructions (Signed)
Acute Kidney Injury, Adult  Acute kidney injury is a sudden worsening of kidney function. The kidneys are organs that have several jobs. They filter the blood to remove waste products and extra fluid. They also maintain a healthy balance of minerals and hormones in the body, which helps control blood pressure and keep bones strong. With this condition, your kidneys do not do their jobs as well as they should. This condition ranges from mild to severe. Over time, it may develop into long-lasting (chronic) kidney disease. Early detection and treatment may prevent acute kidney injury from developing into a chronic condition. What are the causes? Common causes of this condition include:  A problem with blood flow to the kidneys. This may be caused by: ? Low blood pressure (hypotension) or shock. ? Blood loss. ? Heart and blood vessel (cardiovascular) disease. ? Severe burns. ? Liver disease.  Direct damage to the kidneys. This may be caused by: ? Certain medicines. ? A kidney infection. ? Poisoning. ? Being around or in contact with toxic substances. ? A surgical wound. ? A hard, direct hit to the kidney area.  A sudden blockage of urine flow. This may be caused by: ? Cancer. ? Kidney stones. ? An enlarged prostate in males. What increases the risk? You are more likely to develop this condition if you:  Are older than age 65.  Are female.  Are hospitalized, especially if you are in critical condition.  Have certain conditions, such as: ? Chronic kidney disease. ? Diabetes. ? Coronary artery disease and heart failure. ? Pulmonary disease. ? Chronic liver disease. What are the signs or symptoms? Symptoms of this condition may not be obvious until the condition becomes severe. Symptoms of this condition can include:  Tiredness (lethargy) or difficulty staying awake.  Nausea or vomiting.  Swelling (edema) of the face, legs, ankles, or feet.  Problems with urination, such  as: ? Pain in the abdomen, or pain along the side of your stomach (flank). ? Producing little or no urine. ? Passing urine with a weak flow.  Muscle twitches and cramps, especially in the legs.  Confusion or trouble concentrating.  Loss of appetite.  Fever. How is this diagnosed? Your health care provider can diagnose this condition based on your symptoms, medical history, and a physical exam.  You may also have other tests, such as:  Blood tests.  Urine tests.  Imaging tests.  A test in which a sample of tissue is removed from the kidneys to be examined under a microscope (kidney biopsy). How is this treated? Treatment for this condition depends on the cause and how severe the condition is. In mild cases, treatment may not be needed. The kidneys may heal on their own. In more severe cases, treatment will involve:  Treating the cause of the kidney injury. This may involve changing any medicines you are taking or adjusting your dosage.  Fluids. You may need specialized IV fluids to balance your body's needs.  Having a catheter placed to drain urine and prevent blockages.  Preventing problems from occurring. This may mean avoiding certain medicines or procedures that can cause further injury to the kidneys. In some cases, treatment may also require:  A procedure to remove toxic wastes from the body (dialysis or continuous renal replacement therapy, CRRT).  Surgery. This may be done to repair a torn kidney or to remove the blockage from the urinary system. Follow these instructions at home: Medicines  Take over-the-counter and prescription medicines only as   told by your health care provider.  Do not take any new medicines without your health care provider's approval. Many medicines can worsen your kidney damage.  Do not take any vitamin and mineral supplements without your health care provider's approval. Many nutritional supplements can worsen your kidney  damage. Lifestyle  If your health care provider prescribed changes to your diet, follow them. You may need to decrease the amount of protein you eat.  Achieve and maintain a healthy weight. If you need help with this, ask your health care provider.  Start or continue an exercise plan. Try to exercise at least 30 minutes a day, 5 days a week.  Do not use any products that contain nicotine or tobacco, such as cigarettes, e-cigarettes, and chewing tobacco. If you need help quitting, ask your health care provider.   General instructions  Keep track of your blood pressure. Report changes in your blood pressure as told by your health care provider.  Stay up to date with your vaccines. Ask your health care provider which vaccines you need.  Keep all follow-up visits as told by your health care provider. This is important.   Where to find more information  American Association of Kidney Patients: www.aakp.org  National Kidney Foundation: www.kidney.org  American Kidney Fund: www.akfinc.org  Life [employer] Program: ? www.lifeoptions.org ? www.kidneyschool.org Contact a health care provider if:  Your symptoms get worse.  You develop new symptoms. Get help right away if:  You develop symptoms of worsening kidney disease, which include: ? Headaches. ? Abnormally dark or light skin. ? Easy bruising. ? Frequent hiccups. ? Chest pain. ? Shortness of breath. ? End of menstruation in women. ? Seizures. ? Confusion or altered mental status. ? Abdominal or back pain. ? Itchiness.  You have a fever.  Your body is producing less urine.  You have pain or bleeding when you urinate. Summary  Acute kidney injury is a sudden worsening of kidney function.  Acute kidney injury can be caused by problems with blood flow to the kidneys, direct damage to the kidneys, and sudden blockage of urine flow.  Symptoms of this condition may not be obvious until it becomes severe.  Symptoms may include edema, lethargy, confusion, nausea or vomiting, and problems passing urine.  This condition can be diagnosed with blood tests, urine tests, and imaging tests. Sometimes a kidney biopsy is done to diagnose this condition.  Treatment for this condition often involves treating the underlying cause. It is treated with fluids, medicines, diet changes, dialysis, or surgery. This information is not intended to replace advice given to you by your health care provider. Make sure you discuss any questions you have with your health care provider. Document Revised: 01/01/2019 Document Reviewed: 01/01/2019 Elsevier Patient Education  2021 Elsevier Inc.  

## 2020-07-06 ENCOUNTER — Other Ambulatory Visit (HOSPITAL_COMMUNITY): Payer: Self-pay

## 2020-08-13 ENCOUNTER — Other Ambulatory Visit: Payer: Self-pay

## 2020-08-18 ENCOUNTER — Other Ambulatory Visit (HOSPITAL_COMMUNITY): Payer: Self-pay | Admitting: *Deleted

## 2020-08-20 ENCOUNTER — Other Ambulatory Visit: Payer: Self-pay

## 2020-08-20 ENCOUNTER — Encounter (HOSPITAL_COMMUNITY)
Admission: RE | Admit: 2020-08-20 | Discharge: 2020-08-20 | Disposition: A | Discharge status: Home / Self Care | Payer: Medicare Other | Source: Ambulatory Visit | Attending: Internal Medicine | Admitting: Internal Medicine

## 2020-08-20 DIAGNOSIS — N189 Chronic kidney disease, unspecified: Secondary | ICD-10-CM | POA: Diagnosis not present

## 2020-08-20 DIAGNOSIS — D631 Anemia in chronic kidney disease: Secondary | ICD-10-CM | POA: Diagnosis not present

## 2020-08-20 MED ORDER — SODIUM CHLORIDE 0.9 % IV SOLN
510.0000 mg | INTRAVENOUS | Status: DC
  Administered 2020-08-20: 510 mg via INTRAVENOUS
  Filled 2020-08-20: qty 510

## 2020-08-27 ENCOUNTER — Encounter (HOSPITAL_COMMUNITY)
Admission: RE | Admit: 2020-08-27 | Discharge: 2020-08-27 | Disposition: A | Discharge status: Home / Self Care | Payer: Medicare Other | Source: Ambulatory Visit | Attending: Internal Medicine | Admitting: Internal Medicine

## 2020-08-27 ENCOUNTER — Other Ambulatory Visit: Payer: Self-pay

## 2020-08-27 DIAGNOSIS — N189 Chronic kidney disease, unspecified: Secondary | ICD-10-CM | POA: Diagnosis not present

## 2020-08-27 MED ORDER — SODIUM CHLORIDE 0.9 % IV SOLN
510.0000 mg | INTRAVENOUS | Status: DC
  Administered 2020-08-27: 510 mg via INTRAVENOUS
  Filled 2020-08-27: qty 510

## 2020-10-08 ENCOUNTER — Ambulatory Visit (HOSPITAL_COMMUNITY)
Admission: RE | Admit: 2020-10-08 | Discharge: 2020-10-08 | Disposition: A | Discharge status: Home / Self Care | Payer: Medicare Other | Source: Ambulatory Visit | Attending: Internal Medicine | Admitting: Internal Medicine

## 2020-10-08 VITALS — BP 130/74 | HR 88 | Temp 98.2°F | Resp 18

## 2020-10-08 DIAGNOSIS — N179 Acute kidney failure, unspecified: Secondary | ICD-10-CM | POA: Diagnosis not present

## 2020-10-08 DIAGNOSIS — N189 Chronic kidney disease, unspecified: Secondary | ICD-10-CM | POA: Insufficient documentation

## 2020-10-08 LAB — POCT HEMOGLOBIN-HEMACUE: Hemoglobin: 10.4 g/dL — ABNORMAL LOW (ref 12.0–15.0)

## 2020-10-08 MED ORDER — EPOETIN ALFA-EPBX 10000 UNIT/ML IJ SOLN
INTRAMUSCULAR | Status: AC
  Administered 2020-10-08: 15000 [IU] via SUBCUTANEOUS
  Filled 2020-10-08: qty 2

## 2020-10-08 MED ORDER — EPOETIN ALFA-EPBX 10000 UNIT/ML IJ SOLN: 15000.0000 [IU] | INTRAMUSCULAR | Status: DC

## 2020-11-04 ENCOUNTER — Inpatient Hospital Stay (HOSPITAL_COMMUNITY)
Admission: RE | Admit: 2020-11-04 | Discharge: 2020-11-04 | Disposition: A | Discharge status: Home / Self Care | Payer: Medicare Other | Source: Ambulatory Visit | Attending: Internal Medicine | Admitting: Internal Medicine

## 2020-11-05 ENCOUNTER — Encounter (HOSPITAL_COMMUNITY): Payer: Medicare Other

## 2020-11-27 ENCOUNTER — Other Ambulatory Visit: Payer: Self-pay

## 2020-11-27 ENCOUNTER — Ambulatory Visit (HOSPITAL_COMMUNITY)
Admission: RE | Admit: 2020-11-27 | Discharge: 2020-11-27 | Disposition: A | Discharge status: Home / Self Care | Payer: Medicare Other | Source: Ambulatory Visit | Attending: Internal Medicine | Admitting: Internal Medicine

## 2020-11-27 VITALS — BP 155/76 | HR 96 | Temp 97.7°F | Resp 19 | Ht 63.0 in | Wt 122.0 lb

## 2020-11-27 DIAGNOSIS — N189 Chronic kidney disease, unspecified: Secondary | ICD-10-CM | POA: Insufficient documentation

## 2020-11-27 DIAGNOSIS — N179 Acute kidney failure, unspecified: Secondary | ICD-10-CM | POA: Insufficient documentation

## 2020-11-27 LAB — IRON AND TIBC
Iron: 44 ug/dL (ref 28–170)
Saturation Ratios: 20 % (ref 10.4–31.8)
TIBC: 218 ug/dL — ABNORMAL LOW (ref 250–450)
UIBC: 174 ug/dL

## 2020-11-27 LAB — FERRITIN: Ferritin: 229 ng/mL (ref 11–307)

## 2020-11-27 MED ORDER — EPOETIN ALFA-EPBX 10000 UNIT/ML IJ SOLN
INTRAMUSCULAR | Status: AC
  Filled 2020-11-27: qty 1

## 2020-11-27 MED ORDER — EPOETIN ALFA-EPBX 3000 UNIT/ML IJ SOLN
INTRAMUSCULAR | Status: AC
  Administered 2020-11-27: 15000 [IU]
  Filled 2020-11-27: qty 1

## 2020-11-27 MED ORDER — EPOETIN ALFA-EPBX 10000 UNIT/ML IJ SOLN: 15000.0000 [IU] | INTRAMUSCULAR | Status: DC

## 2020-11-27 MED ORDER — EPOETIN ALFA-EPBX 2000 UNIT/ML IJ SOLN
INTRAMUSCULAR | Status: AC
  Filled 2020-11-27: qty 1

## 2020-11-30 LAB — POCT HEMOGLOBIN-HEMACUE: Hemoglobin: 11.7 g/dL — ABNORMAL LOW (ref 12.0–15.0)

## 2020-12-02 ENCOUNTER — Encounter (HOSPITAL_COMMUNITY): Payer: Medicare Other

## 2020-12-18 ENCOUNTER — Inpatient Hospital Stay (HOSPITAL_COMMUNITY): Admission: RE | Admit: 2020-12-18 | Payer: Medicare Other | Source: Ambulatory Visit

## 2020-12-19 ENCOUNTER — Inpatient Hospital Stay (HOSPITAL_COMMUNITY)
Admission: EM | Admit: 2020-12-19 | Discharge: 2021-01-05 | DRG: 242 | Disposition: E | Payer: Medicare Other | Attending: Critical Care Medicine | Admitting: Critical Care Medicine

## 2020-12-19 ENCOUNTER — Encounter (HOSPITAL_COMMUNITY): Payer: Self-pay | Admitting: *Deleted

## 2020-12-19 ENCOUNTER — Other Ambulatory Visit: Payer: Self-pay

## 2020-12-19 ENCOUNTER — Telehealth: Payer: Self-pay | Admitting: Student

## 2020-12-19 ENCOUNTER — Emergency Department (HOSPITAL_COMMUNITY): Payer: Medicare Other

## 2020-12-19 DIAGNOSIS — R0902 Hypoxemia: Secondary | ICD-10-CM

## 2020-12-19 DIAGNOSIS — I4819 Other persistent atrial fibrillation: Principal | ICD-10-CM | POA: Diagnosis present

## 2020-12-19 DIAGNOSIS — R570 Cardiogenic shock: Secondary | ICD-10-CM | POA: Diagnosis not present

## 2020-12-19 DIAGNOSIS — I9589 Other hypotension: Secondary | ICD-10-CM | POA: Diagnosis not present

## 2020-12-19 DIAGNOSIS — R001 Bradycardia, unspecified: Secondary | ICD-10-CM | POA: Diagnosis not present

## 2020-12-19 DIAGNOSIS — J159 Unspecified bacterial pneumonia: Secondary | ICD-10-CM | POA: Diagnosis not present

## 2020-12-19 DIAGNOSIS — Z8249 Family history of ischemic heart disease and other diseases of the circulatory system: Secondary | ICD-10-CM

## 2020-12-19 DIAGNOSIS — I4891 Unspecified atrial fibrillation: Secondary | ICD-10-CM | POA: Diagnosis not present

## 2020-12-19 DIAGNOSIS — J810 Acute pulmonary edema: Secondary | ICD-10-CM | POA: Diagnosis not present

## 2020-12-19 DIAGNOSIS — N185 Chronic kidney disease, stage 5: Secondary | ICD-10-CM

## 2020-12-19 DIAGNOSIS — M3214 Glomerular disease in systemic lupus erythematosus: Secondary | ICD-10-CM | POA: Diagnosis present

## 2020-12-19 DIAGNOSIS — R579 Shock, unspecified: Secondary | ICD-10-CM | POA: Diagnosis not present

## 2020-12-19 DIAGNOSIS — Z992 Dependence on renal dialysis: Secondary | ICD-10-CM

## 2020-12-19 DIAGNOSIS — A419 Sepsis, unspecified organism: Secondary | ICD-10-CM | POA: Diagnosis not present

## 2020-12-19 DIAGNOSIS — I10 Essential (primary) hypertension: Secondary | ICD-10-CM | POA: Diagnosis not present

## 2020-12-19 DIAGNOSIS — E1122 Type 2 diabetes mellitus with diabetic chronic kidney disease: Secondary | ICD-10-CM | POA: Diagnosis present

## 2020-12-19 DIAGNOSIS — I959 Hypotension, unspecified: Secondary | ICD-10-CM | POA: Diagnosis not present

## 2020-12-19 DIAGNOSIS — E872 Acidosis, unspecified: Secondary | ICD-10-CM | POA: Diagnosis not present

## 2020-12-19 DIAGNOSIS — R64 Cachexia: Secondary | ICD-10-CM | POA: Diagnosis present

## 2020-12-19 DIAGNOSIS — E43 Unspecified severe protein-calorie malnutrition: Secondary | ICD-10-CM | POA: Insufficient documentation

## 2020-12-19 DIAGNOSIS — I5033 Acute on chronic diastolic (congestive) heart failure: Secondary | ICD-10-CM | POA: Diagnosis not present

## 2020-12-19 DIAGNOSIS — G9341 Metabolic encephalopathy: Secondary | ICD-10-CM | POA: Diagnosis not present

## 2020-12-19 DIAGNOSIS — N2581 Secondary hyperparathyroidism of renal origin: Secondary | ICD-10-CM | POA: Diagnosis present

## 2020-12-19 DIAGNOSIS — Z452 Encounter for adjustment and management of vascular access device: Secondary | ICD-10-CM

## 2020-12-19 DIAGNOSIS — R0603 Acute respiratory distress: Secondary | ICD-10-CM

## 2020-12-19 DIAGNOSIS — Z79899 Other long term (current) drug therapy: Secondary | ICD-10-CM

## 2020-12-19 DIAGNOSIS — R6521 Severe sepsis with septic shock: Secondary | ICD-10-CM | POA: Diagnosis not present

## 2020-12-19 DIAGNOSIS — D696 Thrombocytopenia, unspecified: Secondary | ICD-10-CM | POA: Diagnosis present

## 2020-12-19 DIAGNOSIS — D689 Coagulation defect, unspecified: Secondary | ICD-10-CM | POA: Diagnosis present

## 2020-12-19 DIAGNOSIS — E871 Hypo-osmolality and hyponatremia: Secondary | ICD-10-CM | POA: Diagnosis not present

## 2020-12-19 DIAGNOSIS — Z515 Encounter for palliative care: Secondary | ICD-10-CM | POA: Diagnosis not present

## 2020-12-19 DIAGNOSIS — I480 Paroxysmal atrial fibrillation: Secondary | ICD-10-CM | POA: Diagnosis not present

## 2020-12-19 DIAGNOSIS — R54 Age-related physical debility: Secondary | ICD-10-CM | POA: Diagnosis present

## 2020-12-19 DIAGNOSIS — J189 Pneumonia, unspecified organism: Secondary | ICD-10-CM | POA: Diagnosis not present

## 2020-12-19 DIAGNOSIS — I132 Hypertensive heart and chronic kidney disease with heart failure and with stage 5 chronic kidney disease, or end stage renal disease: Secondary | ICD-10-CM | POA: Diagnosis present

## 2020-12-19 DIAGNOSIS — J9691 Respiratory failure, unspecified with hypoxia: Secondary | ICD-10-CM

## 2020-12-19 DIAGNOSIS — D631 Anemia in chronic kidney disease: Secondary | ICD-10-CM | POA: Diagnosis present

## 2020-12-19 DIAGNOSIS — Z20822 Contact with and (suspected) exposure to covid-19: Secondary | ICD-10-CM | POA: Diagnosis present

## 2020-12-19 DIAGNOSIS — N179 Acute kidney failure, unspecified: Secondary | ICD-10-CM | POA: Diagnosis not present

## 2020-12-19 DIAGNOSIS — E1165 Type 2 diabetes mellitus with hyperglycemia: Secondary | ICD-10-CM | POA: Diagnosis not present

## 2020-12-19 DIAGNOSIS — N186 End stage renal disease: Secondary | ICD-10-CM | POA: Diagnosis present

## 2020-12-19 DIAGNOSIS — E039 Hypothyroidism, unspecified: Secondary | ICD-10-CM | POA: Diagnosis present

## 2020-12-19 DIAGNOSIS — J9601 Acute respiratory failure with hypoxia: Secondary | ICD-10-CM | POA: Diagnosis not present

## 2020-12-19 DIAGNOSIS — Z9081 Acquired absence of spleen: Secondary | ICD-10-CM

## 2020-12-19 DIAGNOSIS — N189 Chronic kidney disease, unspecified: Secondary | ICD-10-CM

## 2020-12-19 DIAGNOSIS — R55 Syncope and collapse: Secondary | ICD-10-CM | POA: Diagnosis present

## 2020-12-19 DIAGNOSIS — R627 Adult failure to thrive: Secondary | ICD-10-CM | POA: Diagnosis present

## 2020-12-19 DIAGNOSIS — I495 Sick sinus syndrome: Secondary | ICD-10-CM

## 2020-12-19 DIAGNOSIS — Z950 Presence of cardiac pacemaker: Secondary | ICD-10-CM

## 2020-12-19 DIAGNOSIS — Z90710 Acquired absence of both cervix and uterus: Secondary | ICD-10-CM

## 2020-12-19 DIAGNOSIS — I498 Other specified cardiac arrhythmias: Secondary | ICD-10-CM | POA: Diagnosis not present

## 2020-12-19 DIAGNOSIS — I3139 Other pericardial effusion (noninflammatory): Secondary | ICD-10-CM | POA: Diagnosis not present

## 2020-12-19 DIAGNOSIS — E861 Hypovolemia: Secondary | ICD-10-CM | POA: Diagnosis not present

## 2020-12-19 DIAGNOSIS — R778 Other specified abnormalities of plasma proteins: Secondary | ICD-10-CM

## 2020-12-19 DIAGNOSIS — R079 Chest pain, unspecified: Secondary | ICD-10-CM

## 2020-12-19 DIAGNOSIS — E785 Hyperlipidemia, unspecified: Secondary | ICD-10-CM | POA: Diagnosis present

## 2020-12-19 DIAGNOSIS — E875 Hyperkalemia: Secondary | ICD-10-CM | POA: Diagnosis not present

## 2020-12-19 DIAGNOSIS — Z66 Do not resuscitate: Secondary | ICD-10-CM | POA: Diagnosis not present

## 2020-12-19 DIAGNOSIS — R1310 Dysphagia, unspecified: Secondary | ICD-10-CM | POA: Diagnosis not present

## 2020-12-19 DIAGNOSIS — E876 Hypokalemia: Secondary | ICD-10-CM | POA: Diagnosis not present

## 2020-12-19 DIAGNOSIS — Z4659 Encounter for fitting and adjustment of other gastrointestinal appliance and device: Secondary | ICD-10-CM

## 2020-12-19 DIAGNOSIS — I493 Ventricular premature depolarization: Secondary | ICD-10-CM | POA: Diagnosis present

## 2020-12-19 DIAGNOSIS — T45515A Adverse effect of anticoagulants, initial encounter: Secondary | ICD-10-CM | POA: Diagnosis not present

## 2020-12-19 LAB — LIPID PANEL
Cholesterol: 121 mg/dL (ref 0–200)
HDL: 45 mg/dL (ref >40)
LDL Cholesterol: 65 mg/dL (ref 0–99)
Total CHOL/HDL Ratio: 2.7 RATIO
Triglycerides: 54 mg/dL (ref <150)
VLDL: 11 mg/dL (ref 0–40)

## 2020-12-19 LAB — CBC WITH DIFFERENTIAL/PLATELET
Abs Immature Granulocytes: 0.07 10*3/uL (ref 0.00–0.07)
Basophils Absolute: 0.1 10*3/uL (ref 0.0–0.1)
Basophils Relative: 1 %
Eosinophils Absolute: 0.1 10*3/uL (ref 0.0–0.5)
Eosinophils Relative: 1 %
HCT: 32.6 % — ABNORMAL LOW (ref 36.0–46.0)
Hemoglobin: 10.5 g/dL — ABNORMAL LOW (ref 12.0–15.0)
Immature Granulocytes: 1 %
Lymphocytes Relative: 29 %
Lymphs Abs: 4 10*3/uL (ref 0.7–4.0)
MCH: 34.9 pg — ABNORMAL HIGH (ref 26.0–34.0)
MCHC: 32.2 g/dL (ref 30.0–36.0)
MCV: 108.3 fL — ABNORMAL HIGH (ref 80.0–100.0)
Monocytes Absolute: 1.3 10*3/uL — ABNORMAL HIGH (ref 0.1–1.0)
Monocytes Relative: 10 %
Neutro Abs: 8.1 10*3/uL — ABNORMAL HIGH (ref 1.7–7.7)
Neutrophils Relative %: 58 %
Platelets: 352 10*3/uL (ref 150–400)
RBC: 3.01 MIL/uL — ABNORMAL LOW (ref 3.87–5.11)
RDW: 16.3 % — ABNORMAL HIGH (ref 11.5–15.5)
WBC: 13.7 10*3/uL — ABNORMAL HIGH (ref 4.0–10.5)
nRBC: 0 % (ref 0.0–0.2)

## 2020-12-19 LAB — COMPREHENSIVE METABOLIC PANEL
ALT: 10 U/L (ref 0–44)
AST: 19 U/L (ref 15–41)
Albumin: 3.1 g/dL — ABNORMAL LOW (ref 3.5–5.0)
Alkaline Phosphatase: 120 U/L (ref 38–126)
Anion gap: 12 (ref 5–15)
BUN: 35 mg/dL — ABNORMAL HIGH (ref 8–23)
CO2: 16 mmol/L — ABNORMAL LOW (ref 22–32)
Calcium: 9.5 mg/dL (ref 8.9–10.3)
Chloride: 110 mmol/L (ref 98–111)
Creatinine, Ser: 6.23 mg/dL — ABNORMAL HIGH (ref 0.44–1.00)
GFR, Estimated: 7 mL/min — ABNORMAL LOW (ref >60)
Glucose, Bld: 96 mg/dL (ref 70–99)
Potassium: 4.2 mmol/L (ref 3.5–5.1)
Sodium: 138 mmol/L (ref 135–145)
Total Bilirubin: 0.6 mg/dL (ref 0.3–1.2)
Total Protein: 6.5 g/dL (ref 6.5–8.1)

## 2020-12-19 LAB — TROPONIN I (HIGH SENSITIVITY)
Troponin I (High Sensitivity): 1843 ng/L (ref <18)
Troponin I (High Sensitivity): 1658 ng/L (ref <18)

## 2020-12-19 LAB — RESP PANEL BY RT-PCR (FLU A&B, COVID) ARPGX2
Influenza A by PCR: NEGATIVE
Influenza B by PCR: NEGATIVE
SARS Coronavirus 2 by RT PCR: NEGATIVE

## 2020-12-19 LAB — MAGNESIUM: Magnesium: 1.7 mg/dL (ref 1.7–2.4)

## 2020-12-19 MED ORDER — ACETAMINOPHEN 325 MG PO TABS
650.0000 mg | ORAL_TABLET | Freq: Four times a day (QID) | ORAL | Status: DC | PRN
  Administered 2020-12-21: 650 mg via ORAL
  Filled 2020-12-19: qty 2

## 2020-12-19 MED ORDER — ACETAMINOPHEN 650 MG RE SUPP: 650.0000 mg | Freq: Four times a day (QID) | RECTAL | Status: DC | PRN

## 2020-12-19 MED ORDER — SODIUM BICARBONATE 650 MG PO TABS
1300.0000 mg | ORAL_TABLET | Freq: Three times a day (TID) | ORAL | Status: DC
  Administered 2020-12-19 – 2020-12-24 (×16): 1300 mg via ORAL
  Filled 2020-12-19 (×16): qty 2

## 2020-12-19 MED ORDER — HEPARIN BOLUS VIA INFUSION
3000.0000 [IU] | Freq: Once | INTRAVENOUS | Status: AC
  Administered 2020-12-19: 3000 [IU] via INTRAVENOUS
  Filled 2020-12-19: qty 3000

## 2020-12-19 MED ORDER — ONDANSETRON HCL 4 MG PO TABS
4.0000 mg | ORAL_TABLET | Freq: Four times a day (QID) | ORAL | Status: DC | PRN
  Administered 2020-12-20 – 2020-12-22 (×2): 4 mg via ORAL
  Filled 2020-12-19 (×2): qty 1

## 2020-12-19 MED ORDER — MAGNESIUM SULFATE 2 GM/50ML IV SOLN
2.0000 g | Freq: Once | INTRAVENOUS | Status: AC
  Administered 2020-12-19: 2 g via INTRAVENOUS
  Filled 2020-12-19: qty 50

## 2020-12-19 MED ORDER — DILTIAZEM HCL-DEXTROSE 125-5 MG/125ML-% IV SOLN (PREMIX)
5.0000 mg/h | INTRAVENOUS | Status: DC
  Administered 2020-12-19: 5 mg/h via INTRAVENOUS
  Filled 2020-12-19: qty 125

## 2020-12-19 MED ORDER — SODIUM CHLORIDE 0.9 % IV SOLN: INTRAVENOUS | Status: AC

## 2020-12-19 MED ORDER — TRAMADOL HCL 50 MG PO TABS
50.0000 mg | ORAL_TABLET | Freq: Four times a day (QID) | ORAL | Status: DC | PRN
  Administered 2020-12-19 – 2020-12-27 (×7): 50 mg via ORAL
  Filled 2020-12-19 (×8): qty 1

## 2020-12-19 MED ORDER — MELATONIN 5 MG PO TABS
5.0000 mg | ORAL_TABLET | Freq: Every evening | ORAL | Status: DC | PRN
  Administered 2020-12-19 – 2020-12-31 (×7): 5 mg via ORAL
  Filled 2020-12-19 (×8): qty 1

## 2020-12-19 MED ORDER — HEPARIN (PORCINE) 25000 UT/250ML-% IV SOLN
1100.0000 [IU]/h | INTRAVENOUS | Status: DC
  Administered 2020-12-19: 800 [IU]/h via INTRAVENOUS
  Administered 2020-12-20: 900 [IU]/h via INTRAVENOUS
  Filled 2020-12-19 (×2): qty 250

## 2020-12-19 MED ORDER — METOPROLOL TARTRATE 25 MG PO TABS: 25.0000 mg | ORAL_TABLET | Freq: Two times a day (BID) | ORAL | Status: DC

## 2020-12-19 MED ORDER — ONDANSETRON HCL 4 MG/2ML IJ SOLN
4.0000 mg | Freq: Four times a day (QID) | INTRAMUSCULAR | Status: DC | PRN
  Administered 2020-12-22 – 2020-12-24 (×2): 4 mg via INTRAVENOUS
  Filled 2020-12-19 (×2): qty 2

## 2020-12-19 MED ORDER — PRAVASTATIN SODIUM 10 MG PO TABS
20.0000 mg | ORAL_TABLET | Freq: Every day | ORAL | Status: DC
  Administered 2020-12-19 – 2020-12-26 (×8): 20 mg via ORAL
  Filled 2020-12-19 (×9): qty 2

## 2020-12-19 NOTE — ED Notes (Signed)
While at bedside administering meds pt stated feeling dizzy. Monitor showed asystole with about a 3 second pause. Pt alert and talking to this RN. Pt's HR then went up to 50's and back up to the 120's. Pt had this episode happen about 3 times. Chen, DO notified and is at bedside. Zoll pads placed. Pt A/O x4.

## 2020-12-19 NOTE — Assessment & Plan Note (Signed)
On cardizem gtts. Hold norvasc.

## 2020-12-19 NOTE — Assessment & Plan Note (Addendum)
Admit to progressive bed. IV cardizem gtts. Check echo. Check TSH. Cardiology has been consulted by EDP. On IV heparin gtts for anticoagulation. Elevated troponin likely demand ischemia from tachycardia. Pt without chest pain. Defer to cardiology to address elevated troponin.

## 2020-12-19 NOTE — Subjective & Objective (Signed)
CC: fatigue, tachycardia HPI: 70 year old female with a history of hypertension, history of CKD stage V currently on transplant evaluation at [facility], history of lupus nephritis more than 3 decades ago presents to the ER today with a 2-day history of weakness.  Patient has a history of atrial flutter status post ablation many years ago.  She has had increasing dizziness over the last 2 days.  She is checked her heart rate at home.'s been variable between 40s and 60s.  She states that she felt some fluttering in her chest earlier today.  She came to the ER for evaluation.  She has had some chest soreness but no actual pain.  She has been somewhat short of breath.  She denies any lower extremity edema.  She states that her symptoms started around the time she was doing some housework at home.  On admission the ER heart rate was 175 blood pressure 152/91 satting 100 percent on room air.  EKG demonstrated rapid atrial fibrillation.  Laboratory evaluation showed a sodium 138 bicarb of 16 BUN of 35 creatinine 6.2  Magnesium 1.7 hemoglobin of 10.5 white count of 13.7 platelet 352.  Chest x-ray is negative for any pulmonary edema.  Patient is followed in office by Dr. Caryl Comes with cardiology Dr. Johnney Ou with nephrology.  Due to rapid atrial fibrillation, [facility] hospitalist contacted for admission.

## 2020-12-19 NOTE — Telephone Encounter (Signed)
    The patient called the after-hours line reporting that she had palpitations yesterday which possibly resembled her prior atrial fibrillation/flutter but today she has experienced weakness and dizziness. Had pre-syncope earlier today but no actual syncopal events. Denies any specific palpitations today. She has checked her vitals at home and her heart rate has been variable between the 40's to 60's. She does report good compliance with Propafenone and is not currently on any other AV nodal blocking agents. She did check her vitals while on the phone and BP was well controlled and heart rate was in the 60's. It is unclear if her variable heart rate is due to recurrent atrial fibrillation/flutter or possibly due to PVC's as she experienced by prior monitoring. I reviewed with her I will send a note to the [facility] office to see if a follow-up visit can be arranged with Dr. Caryl Comes or EP APP this week.  If nothing is available at [facility], would recommend the atrial fibrillation clinic as she would benefit from a repeat EKG and might possibly require another monitor.  We reviewed precautions for what would warrant ED evaluation in the interim. She voiced understanding of this and was appreciative of the return call.  Signed, Erma Heritage, PA-C 12/14/2020, 3:15 PM

## 2020-12-19 NOTE — ED Notes (Signed)
EDP Pickering made aware of pt's critical Troponin of 1,843.

## 2020-12-19 NOTE — Progress Notes (Signed)
Cardiology brief note  RN called stating the patient is having frequent pauses, some of them are very long pause associated with dizziness, weakness and fatigue.  The strips are in the chart and note from Dr Bridgett Larsson  Patient has Sick sinus syndrome, now behaving tachy-brady with significant pauses.  She is currently in a junctional rhtyhm with HR 45-50s  Recs: D/c diltiazem gtt, d/c metoprolol  - hold AV nodal blocking agents  - its ok if she becomes tachycardic- as long as not super fast> 130s.  - will need EP to see her and get PPM  - continue heparin gtt -troponin significantly elevated (1843/1658) in the setting of afib rvr and CKD V, no chest pain   Keep atropine at bedside, attach pads Agree with admitting her to progressive unit If persistently brady or has hemodynamically unstable then she needs TVP (temp pacer)   Renae Fickle, MD Cardiology coverage

## 2020-12-19 NOTE — Progress Notes (Signed)
ANTICOAGULATION CONSULT NOTE - Initial Consult  Pharmacy Consult for heparin Indication: atrial fibrillation  Allergies  Allergen Reactions   Codeine Itching    Patient Measurements: Height: '5\' 3"'$  (160 cm) Weight: 56.7 kg (125 lb) IBW/kg (Calculated) : 52.4 Heparin Dosing Weight: TBW  Vital Signs: Temp: 99.8 F (37.7 C) (10/15 1645) Temp Source: Rectal (10/15 1645) BP: 125/66 (10/15 1915) Pulse Rate: 128 (10/15 1915)  Labs: Recent Labs    12/30/2020 1626  HGB 10.5*  HCT 32.6*  PLT 352  CREATININE 6.23*  TROPONINIHS 1,843*    Estimated Creatinine Clearance: 7 mL/min (A) (by C-G formula based on SCr of 6.23 mg/dL (H)).   Medical History: Past Medical History:  Diagnosis Date   CKD (chronic kidney disease)    Lupus (systemic lupus erythematosus) (HCC)    Wide-complex tachycardia    probable atrial flutter with 1-1 and 2-1 conduction    Assessment: 56 YOF presenting with SOB and lightheadedness, hx of aflutter s/p ablation.  She is not on anticoagulation PTA, chronic anemia stable CHA2DS2-VASc = 3  Goal of Therapy:  Heparin level 0.3-0.7 units/ml Monitor platelets by anticoagulation protocol: Yes   Plan:  Heparin 3000 units IV x 1, and gtt at 800 units/hr F/u 8 hour heparin level F/u long term [facility] plan  Bertis Ruddy, PharmD Clinical Pharmacist ED Pharmacist Phone # [phone] 12/17/2020 7:42 PM

## 2020-12-19 NOTE — Assessment & Plan Note (Signed)
Check TSH 

## 2020-12-19 NOTE — ED Notes (Signed)
Please call Felisa Bonier [facility]) [phone] with any updates.

## 2020-12-19 NOTE — Progress Notes (Signed)
Called to bedside by RN. Pt with 4-5 sec pause on telemetry. Then went back to rapid afib. 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  Continue with cardizem and heparin gtts.

## 2020-12-19 NOTE — ED Notes (Signed)
Pt continuing to have pauses in HR. Cardiologist notified and is at bedside. Pt c/o SHOB, Dizziness and Arm Numbness. Cards notified. Pt A/O x4.

## 2020-12-19 NOTE — ED Notes (Signed)
Rectal temperature was taken and resulted in 99.71F.

## 2020-12-19 NOTE — ED Provider Notes (Addendum)
[facility] EMERGENCY DEPARTMENT Provider Note   CSN: [id number] Arrival date & time: 01/04/2021  1545     History Chief Complaint  Patient presents with   Irregular Heart Beat   Near Syncope    Debra Petersen is a 70 y.o. female.   Near Syncope Pertinent negatives include no abdominal pain and no shortness of breath. Patient presents feeling lightheadedness.  States her heart has been going slow.  Also do "flips".  Has had episodes like this before.  History of atrial flutter with an ablation over a decade ago.  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  Takes propranolol and Rythmol.  Has continued to have episodes despite taking her propranolol.  No fevers.  States she was checking her pulse at home and it was low.  Upon arrival found to be tachycardic.  Has held previous things of her heart since yesterday, but has had episodes of these also recently.  No fevers or chills.  Not on anticoagulation.  Does have a history of chronic kidney disease.     Past Medical History:  Diagnosis Date   CKD (chronic kidney disease)    Lupus (systemic lupus erythematosus) ([place])    Wide-complex tachycardia    probable atrial flutter with 1-1 and 2-1 conduction    Patient Active Problem List   Diagnosis Date Noted   AKI (acute kidney injury) ([place]) 06/23/2020   Diarrhea 10/03/2019   Acute kidney injury superimposed on chronic kidney disease ([place]) 10/03/2019   Dehydration 10/03/2019   Acute-on-chronic kidney injury ([place]) 10/03/2019   Stress due to illness of family member 03/19/2013   HYPERTENSION, BENIGN 12/03/2009   Hypothyroidism 04/02/2009   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     Family History  Problem Relation Age of Onset   Coronary artery disease Mother        had a pacemaker   Coronary artery disease Father     Social History   Tobacco Use   Smoking status: Never   Smokeless tobacco: Never  Vaping Use   Vaping Use: Never used  Substance Use Topics   Alcohol use: Not Currently   Drug use: Never    Home Medications Prior to Admission medications   Medication Sig Start Date End Date Taking? Authorizing Provider  acetaminophen (TYLENOL) 500 MG tablet Take 1,000 mg by mouth every 6 (six) hours as needed for mild pain.    [provider]  amLODipine (NORVASC) 5 MG tablet Take by mouth daily in the afternoon.    [provider]  calcitRIOL (ROCALTROL) 0.25 MCG capsule Take 1 capsule (0.25 mcg total) by mouth daily. 06/26/20   Florencia Reasons, MD  ferrous sulfate 325 (65 FE) MG tablet Take 1 tablet (325 mg total) by mouth daily with breakfast. 06/25/20   Florencia Reasons, MD  Potassium Chloride ER 20 MEQ TBCR Take 20 mEq by mouth daily for 3 days. 06/25/20 06/28/20  Florencia Reasons, MD  pravastatin (PRAVACHOL) 20 MG tablet Take 20 mg by mouth daily.    [provider]  promethazine (PHENERGAN) 25 MG tablet Take 25 mg by mouth every 8 (eight) hours as needed for nausea. 06/23/20   [provider]  propafenone (RYTHMOL) 150 MG tablet TAKE 1 TABLET BY MOUTH TWICE DAILY. Patient taking differently: Take 150 mg by mouth in the morning and at bedtime. 04/14/20   Deboraha Sprang, MD  sodium bicarbonate 650 MG tablet Take 2 tablets (1,300 mg total) by mouth 3 (three) times daily. 06/25/20   Florencia Reasons, MD    Allergies    Codeine  Review of Systems   Review of Systems  Constitutional:  Negative for appetite change.  HENT:  Negative for congestion.   Respiratory:  Negative for shortness of breath.   Cardiovascular:  Positive for palpitations and near-syncope.   Gastrointestinal:  Negative for abdominal pain.  Endocrine: Negative for polyuria.  Genitourinary:  Negative for flank pain.  Musculoskeletal:  Negative for back pain.  Neurological:  Positive for light-headedness.  Psychiatric/Behavioral:  Negative for confusion.    Physical Exam Updated Vital Signs BP 129/74   Pulse (!) 136   Temp 99.8 F (37.7 C) (Rectal)   Resp (!) 24   Ht '5\' 3"'$  (1.6 m)   Wt 56.7 kg   SpO2 98%   BMI 22.14 kg/m   Physical Exam Vitals and nursing note reviewed.  HENT:     Head: Normocephalic.  Eyes:     Pupils: Pupils are equal, round, and reactive to light.  Cardiovascular:     Rate and Rhythm: Tachycardia present. Rhythm irregular.  Pulmonary:     Breath sounds: No wheezing or rhonchi.  Abdominal:     Tenderness: There is no abdominal tenderness.  Musculoskeletal:        General: No tenderness.     Cervical back: Neck supple.     Right lower leg: No edema.     Left lower leg: No edema.  Skin:    General: Skin is warm.     Capillary Refill: Capillary refill takes less than 2 seconds.  Neurological:     Mental Status: She is alert and oriented to person, place, and time.  Psychiatric:        Mood and Affect: Mood normal.    ED Results / Procedures / Treatments   Labs (all labs ordered are listed, but only abnormal results are displayed) Labs Reviewed  COMPREHENSIVE METABOLIC PANEL - Abnormal; Notable for the following components:      Result Value   CO2 16 (*)    BUN 35 (*)    Creatinine, Ser 6.23 (*)    Albumin 3.1 (*)    GFR, Estimated 7 (*)    All other components within normal limits  CBC WITH DIFFERENTIAL/PLATELET - Abnormal; Notable for the following components:   WBC 13.7 (*)    RBC 3.01 (*)    Hemoglobin 10.5 (*)    HCT 32.6 (*)    MCV 108.3 (*)    MCH 34.9 (*)    RDW 16.3 (*)    Neutro Abs 8.1 (*)    Monocytes Absolute 1.3 (*)    All other components within normal limits  TROPONIN I (HIGH SENSITIVITY) - Abnormal;  Notable for the following components:   Troponin I (High Sensitivity) 1,843 (*)    All other components within normal limits  RESP PANEL BY RT-PCR (FLU A&B, COVID) ARPGX2  MAGNESIUM  TROPONIN I (HIGH SENSITIVITY)    EKG EKG Interpretation  Date/Time:  Saturday December 19 2020 15:57:33  EDT Ventricular Rate:  154 PR Interval:    QRS Duration: 88 QT Interval:  296 QTC Calculation: 474 R Axis:   128 Text Interpretation: Atrial fibrillation with rapid ventricular response with premature ventricular or aberrantly conducted complexes Right axis deviation Septal infarct , age undetermined Abnormal ECG Confirmed by Davonna Belling [phone]) on 12/26/2020 4:05:54 PM  Radiology DG Chest Portable 1 View  Result Date: 12/12/2020 CLINICAL DATA:  Palpitations EXAM: PORTABLE CHEST 1 VIEW COMPARISON:  06/25/2020 FINDINGS: The heart size and mediastinal contours are within normal limits. Both lungs are clear. The visualized skeletal structures are unremarkable. IMPRESSION: No acute abnormality of the lungs in AP portable projection. Electronically Signed   By: Delanna Ahmadi M.D.   On: 12/12/2020 16:44    Procedures Procedures   Medications Ordered in ED Medications  diltiazem (CARDIZEM) 125 mg in dextrose 5% 125 mL (1 mg/mL) infusion (15 mg/hr Intravenous Rate/Dose Change 12/25/2020 1910)    ED Course  I have reviewed the triage vital signs and the nursing notes.  Pertinent labs & imaging results that were available during my care of the patient were reviewed by me and considered in my medical decision making (see chart for details).    MDM Rules/Calculators/A&P                           Patient presents with fast heart rate.  Has had some heart palpitations.  Appears to be in A. fib with RVR at this time however.  May have started yesterday but has had episodes recently.  Feel her heart flips.  Do not think we can rule those out and his other A. fib episodes are often she is a good candidate  for emergency cardioversion at this time.  Has some chronic renal insufficiency/failure.  States that her creatinine was recently 5.8.  Sees Dr. Johnney Ou.  Today however creatinine is up to 6.2.  Troponin is elevated.  I think this is likely secondary to the rate with her A. fib and potentially worsening renal function as opposed to a MI related to hypoperfusion from this large vessel.  Initial temperature was 100.4, however when checked rectally was only 99.8.  Chest x-ray reassuring.  CHA2DS2-VASc score of 3.  Will admit to hospitalist with cardiology consult.  We will also discuss with nephrology.  Started on Cardizem drip.  However started somewhat slowly since she states her rate has been going down to the 40s at home.  CRITICAL CARE Performed by: Davonna Belling Total critical care time: 30 minutes Critical care time was exclusive of separately billable procedures and treating other patients. Critical care was necessary to treat or prevent imminent or life-threatening deterioration. Critical care was time spent personally by me on the following activities: development of treatment plan with patient and/or surrogate as well as nursing, discussions with consultants, evaluation of patient's response to treatment, examination of patient, obtaining history from patient or surrogate, ordering and performing treatments and interventions, ordering and review of laboratory studies, ordering and review of radiographic studies, pulse oximetry and re-evaluation of patient's condition.  Cardiology and nephrology will both see patient  Final Clinical Impression(s) / ED Diagnoses Final diagnoses:  Chronic kidney disease, unspecified CKD stage  Atrial fibrillation with rapid ventricular response ([place])  Elevated troponin    Rx / DC Orders ED Discharge Orders     None        Davonna Belling, MD 01/04/2021 1913    Davonna Belling,  MD 12/22/2020 1926

## 2020-12-19 NOTE — Consult Note (Signed)
Cardiology Consultation:   Patient ID: Debra Petersen MRN: [id number]; DOB: June 15, 1950  Admit date: 12/25/2020 Date of Consult: 12/23/2020  PCP:  Prince Solian, MD   [facility] Providers Cardiologist:  None        Patient Profile:   Debra Petersen is a 70 year old female with a history of hypertension, history of CKD stage V currently on transplant evaluation at [facility], history of lupus nephritis more than 3 decades ago, Aflutter s/p ablation (2010), SVT/atach, anemia of chronic dx presents with c/o near syncope, weakness, palpitations who is being seen 12/29/2020 for the evaluation of afib RVR at the request of ER doc and hospitalist  (Dr Bridgett Larsson).  History of Present Illness:   Debra Petersen is a 70 year old female with a history of hypertension, history of CKD stage V currently on transplant evaluation at [facility], history of lupus nephritis more than 3 decades ago, Aflutter s/p ablation, SVT/atach, anemia of chronic dx presents with c/o near syncope, weakness, palpitations.  Patient reports weakness for 2 days, but started having palpitations last night- on and off but today got worse associated with weakness, dizziness, and not feeling well so she came to the hospital. She denies any palpitations or recent illness prior to this. Was doing well, compliant with meds.   On admission the ER heart rate was 175 blood pressure 152/91 satting 100 percent on room air.  EKG demonstrated rapid atrial fibrillation. ER- started on Cardizem gtt (no bolus), heparin gtt and called Cardiology consultation   EKG : afib RVR, hR 154 bpm   Cardiac work up: 10/28/2019: Lexiscan stress myoview FINDINGS:  Unexpected extra cardiac findings: None.  Defect: No inducible defect to suggest ischemia. No large fixed defect to suggest transmural scar.   TID: <1.25   Gated rest images:  End diastolic volume: 40 cc  End systolic volume: 12 cc  Ejection fraction: >70%.  Wall motion abnormalities: None.   Gated stress images:  End diastolic volume: 27 cc  End systolic volume: 4 cc  Ejection fraction: >70%.  Wall motion abnormalities: None.     10/23/2019: TTE SUMMARY  The left ventricular size is normal.  LV ejection fraction = 60-65%.  Left ventricular systolic function is normal.  The left ventricular wall motion is normal.  The right ventricle is normal in size and function.  The left and right atrial size are normal.  There is no significant valvular stenosis or regurgitation.  Estimated right ventricular systolic pressure is 31 mmHg.  Estimated right atrial pressure is 5 mmHg.Marland Kitchen  There is no pericardial effusion.  There is no comparison study available.    Past Medical History:  Diagnosis Date   CKD (chronic kidney disease)    Lupus (systemic lupus erythematosus) ([facility])    Wide-complex tachycardia    probable atrial flutter with 1-1 and 2-1 conduction    Past Surgical History:  Procedure Laterality Date   ABDOMINAL HYSTERECTOMY     CARDIAC ELECTROPHYSIOLOGY STUDY AND ABLATION  02/23/2009   of typical and atypical flutter   COLON SURGERY     GALLBLADDER SURGERY     hystoectomy     SPLENECTOMY, TOTAL       Home Medications:  Prior to Admission medications   Medication Sig Start Date End Date Taking? Authorizing Provider  acetaminophen (TYLENOL) 500 MG tablet Take 1,000 mg by mouth every 6 (six) hours as needed for mild pain.    [provider]  amLODipine (NORVASC) 5 MG tablet Take by mouth daily in  the afternoon.    [provider]  calcitRIOL (ROCALTROL) 0.25 MCG capsule Take 1 capsule (0.25 mcg total) by mouth daily. 06/26/20   Florencia Reasons, MD  ferrous sulfate 325 (65 FE) MG tablet Take 1 tablet (325 mg total) by mouth daily with breakfast. 06/25/20   Florencia Reasons, MD  Potassium Chloride ER 20 MEQ TBCR Take 20 mEq by mouth daily for 3 days. 06/25/20 06/28/20  Florencia Reasons, MD  pravastatin (PRAVACHOL) 20 MG tablet Take 20 mg by mouth daily.    [provider]  promethazine (PHENERGAN) 25 MG tablet Take 25 mg by mouth every 8 (eight) hours as needed for nausea. 06/23/20   [provider]  propafenone (RYTHMOL) 150 MG tablet TAKE 1 TABLET BY MOUTH TWICE DAILY. Patient taking differently: Take 150 mg by mouth in the morning and at bedtime. 04/14/20   Deboraha Sprang, MD  sodium bicarbonate 650 MG tablet Take 2 tablets (1,300 mg total) by mouth 3 (three) times daily. 06/25/20   Florencia Reasons, MD    Inpatient Medications: Scheduled Meds:  metoprolol tartrate  25 mg Oral BID   pravastatin  20 mg Oral Daily   sodium bicarbonate  1,300 mg Oral TID   Continuous Infusions:  sodium chloride 50 mL/hr at 12/26/2020 2159   diltiazem (CARDIZEM) infusion 15 mg/hr (12/23/2020 1910)   heparin 800 Units/hr (12/14/2020 2201)   PRN Meds:   Allergies:    Allergies  Allergen Reactions   Codeine Itching    Social History:   Social History   Socioeconomic History   Marital status: Widowed    Spouse name: Not on file   Number of children: Not on file   Years of education: Not on file   Highest education level: Not on file  Occupational History   Not on file  Tobacco Use   Smoking status: Never   Smokeless tobacco: Never  Vaping Use   Vaping Use: Never used  Substance and Sexual Activity   Alcohol use: Not Currently   Drug use: Never   Sexual activity: Not on file  Other Topics Concern   Not on file  Social History Narrative   Not on file   Social Determinants of Health   Financial Resource Strain: Not on file  Food Insecurity: Not on file  Transportation Needs: Not on file  Physical Activity: Not on file  Stress: Not on file  Social Connections: Not on file  Intimate Partner Violence: Not on file    Family History:    Family History  Problem Relation Age of Onset   Coronary artery disease Mother        had a pacemaker   Coronary artery disease Father      ROS:  Please see the history of present illness.   All other  ROS reviewed and negative.     Physical Exam/Data:   Vitals:   12/24/2020 2158 12/07/2020 2159 12/30/2020 2201 01/04/2021 2215  BP:   125/68 116/68  Pulse: (!) 108 96 85 (!) 113  Resp: (!) 22 18 (!) 23 (!) 25  Temp:      TempSrc:      SpO2: 99% 99% 100% 98%  Weight:      Height:       No intake or output data in the 24 hours ending 12/09/2020 2221 Last 3 Weights 12/07/2020 11/27/2020 06/28/2020  Weight (lbs) 125 lb 122 lb 117 lb 4.6 oz  Weight (kg) 56.7 kg 55.339 kg 53.2 kg  Body mass index is 22.14 kg/m.  General:  Well nourished, well developed, in no acute distress HEENT: normal Neck: no JVD Vascular: No carotid bruits; Distal pulses 2+ bilaterally Cardiac:  normal S1, S2; irregularly irregular, no murmurs Lungs:  clear to auscultation bilaterally, no wheezing, rhonchi or rales  Abd: soft, nontender, no hepatomegaly  Ext: no edema Musculoskeletal:  No deformities, BUE and BLE strength normal and equal Skin: warm and dry  Neuro:  CNs 2-12 intact, no focal abnormalities noted Psych:  Normal affect   EKG:  The EKG was personally reviewed and demonstrates:  afib RVR  Relevant CV Studies: As noted abvoe  Laboratory Data:  High Sensitivity Troponin:   Recent Labs  Lab 01/02/2021 1626 12/10/2020 1940  TROPONINIHS 1,843* 1,658*     Chemistry Recent Labs  Lab 12/05/2020 1626  NA 138  K 4.2  CL 110  CO2 16*  GLUCOSE 96  BUN 35*  CREATININE 6.23*  CALCIUM 9.5  MG 1.7  GFRNONAA 7*  ANIONGAP 12    Recent Labs  Lab 12/25/2020 1626  PROT 6.5  ALBUMIN 3.1*  AST 19  ALT 10  ALKPHOS 120  BILITOT 0.6   Lipids No results for input(s): CHOL, TRIG, HDL, LABVLDL, LDLCALC, CHOLHDL in the last 168 hours.  Hematology Recent Labs  Lab 12/18/2020 1626  WBC 13.7*  RBC 3.01*  HGB 10.5*  HCT 32.6*  MCV 108.3*  MCH 34.9*  MCHC 32.2  RDW 16.3*  PLT 352   Thyroid No results for input(s): TSH, FREET4 in the last 168 hours.  BNPNo results for input(s): BNP, PROBNP in the last  168 hours.  DDimer No results for input(s): DDIMER in the last 168 hours.   Radiology/Studies:  DG Chest Portable 1 View  Result Date: 12/14/2020 CLINICAL DATA:  Palpitations EXAM: PORTABLE CHEST 1 VIEW COMPARISON:  06/25/2020 FINDINGS: The heart size and mediastinal contours are within normal limits. Both lungs are clear. The visualized skeletal structures are unremarkable. IMPRESSION: No acute abnormality of the lungs in AP portable projection. Electronically Signed   By: Delanna Ahmadi M.D.   On: 12/25/2020 16:44     Assessment and Plan:   Paroxysmal Afib RVR, chadsvasc 3 H/o Aflutter s/p ablation (2010)- h/o atach, pvcs- on propafenone Near syncope/dizziness/weakness sec to above AKI, CKD V H/o lupus nephritis Hypothyroidism, HTN   Plan: - agree with IV heparin gtt and Diltiazem gtt - check TSH, Covid, viral panel - NPO after MN on Sunday- for TEE DCCV on Monday  - get ECHO trow - replace Mag and keep lytes K>4, Mg >2  - will likley need EP input on antiarrhythmic strategy: 2021- stress test negative, she has CKD V- so most of the anti-arrhythmics will be CI except amiodarone.  Hold propafenone for now. - start metoprolol tart '25mg'$  BID and heparin gtt can be transitioned to Eliquis '5mg'$  BID - continue home meds.  Will follow along   Risk Assessment/Risk Scores:          CHA2DS2-VASc Score = 3   This indicates a 3.2% annual risk of stroke. The patient's score is based upon: CHF History: 0 HTN History: 1 Diabetes History: 0 Stroke History: 0 Vascular Disease History: 0 Age Score: 1 Gender Score: 1        For questions or updates, please contact [place] Please consult [email or web address] for contact info under    Signed, Renae Fickle, MD  12/11/2020 10:21 PM

## 2020-12-19 NOTE — H&P (Signed)
History and Physical    Pascale Deschamp [id number] DOB: 1950/03/16 DOA: 12/26/2020  PCP: Prince Solian, MD   Patient coming from: Home  I have personally briefly reviewed patient's old medical records in [place]  CC: fatigue, tachycardia HPI: 70 year old female with a history of hypertension, history of CKD stage V currently on transplant evaluation at [facility], history of lupus nephritis more than 3 decades ago presents to the ER today with a 2-day history of weakness.  Patient has a history of atrial flutter status post ablation many years ago.  She has had increasing dizziness over the last 2 days.  She is checked her heart rate at home.'s been variable between 40s and 60s.  She states that she felt some fluttering in her chest earlier today.  She came to the ER for evaluation.  She has had some chest soreness but no actual pain.  She has been somewhat short of breath.  She denies any lower extremity edema.  She states that her symptoms started around the time she was doing some housework at home.  On admission the ER heart rate was 175 blood pressure 152/91 satting 100 percent on room air.  EKG demonstrated rapid atrial fibrillation.  Laboratory evaluation showed a sodium 138 bicarb of 16 BUN of 35 creatinine 6.2  Magnesium 1.7 hemoglobin of 10.5 white count of 13.7 platelet 352.  Chest x-ray is negative for any pulmonary edema.  Patient is followed in office by Dr. Caryl Comes with cardiology Dr. Johnney Ou with nephrology.  Due to rapid atrial fibrillation, [facility] contacted for admission.   ED Course:  Noted to be in rapid afib. Started on cardizem gtts. Heparin gtts. Cardiology and nephrology consulted.  Review of Systems:  Review of Systems  Constitutional:  Positive for malaise/fatigue. Negative for chills and fever.  HENT: Negative.    Respiratory:  Positive for shortness of breath. Negative for cough, hemoptysis and sputum production.   Cardiovascular:   Positive for chest pain and palpitations.       C/o chest soreness but no overt sharp chest pain  Gastrointestinal: Negative.   Genitourinary: Negative.   Musculoskeletal: Negative.   Skin: Negative.   Neurological:  Positive for dizziness and weakness.  Endo/Heme/Allergies: Negative.   Psychiatric/Behavioral: Negative.    All other systems reviewed and are negative.  Past Medical History:  Diagnosis Date   CKD (chronic kidney disease)    Lupus (systemic lupus erythematosus) (HCC)    Wide-complex tachycardia    probable atrial flutter with 1-1 and 2-1 conduction    Past Surgical History:  Procedure Laterality Date   ABDOMINAL HYSTERECTOMY     CARDIAC ELECTROPHYSIOLOGY STUDY AND ABLATION  02/23/2009   of typical and atypical flutter   COLON SURGERY     GALLBLADDER SURGERY     hystoectomy     SPLENECTOMY, TOTAL       reports that she has never smoked. She has never used smokeless tobacco. She reports that she does not currently use alcohol. She reports that she does not use drugs.  Allergies  Allergen Reactions   Codeine Itching    Family History  Problem Relation Age of Onset   Coronary artery disease Mother        had a pacemaker   Coronary artery disease Father     Prior to Admission medications   Medication Sig Start Date End Date Taking? Authorizing Provider  acetaminophen (TYLENOL) 500 MG tablet Take 1,000 mg by mouth every 6 (six) hours  as needed for mild pain.    [provider]  amLODipine (NORVASC) 5 MG tablet Take by mouth daily in the afternoon.    [provider]  calcitRIOL (ROCALTROL) 0.25 MCG capsule Take 1 capsule (0.25 mcg total) by mouth daily. 06/26/20   Florencia Reasons, MD  ferrous sulfate 325 (65 FE) MG tablet Take 1 tablet (325 mg total) by mouth daily with breakfast. 06/25/20   Florencia Reasons, MD  Potassium Chloride ER 20 MEQ TBCR Take 20 mEq by mouth daily for 3 days. 06/25/20 06/28/20  Florencia Reasons, MD  pravastatin (PRAVACHOL) 20 MG tablet Take  20 mg by mouth daily.    [provider]  promethazine (PHENERGAN) 25 MG tablet Take 25 mg by mouth every 8 (eight) hours as needed for nausea. 06/23/20   [provider]  propafenone (RYTHMOL) 150 MG tablet TAKE 1 TABLET BY MOUTH TWICE DAILY. Patient taking differently: Take 150 mg by mouth in the morning and at bedtime. 04/14/20   Deboraha Sprang, MD  sodium bicarbonate 650 MG tablet Take 2 tablets (1,300 mg total) by mouth 3 (three) times daily. 06/25/20   Florencia Reasons, MD    Physical Exam: Vitals:   12/10/2020 1830 01/02/2021 1845 12/06/2020 1900 12/30/2020 1915  BP: 129/82 129/74 122/74 125/66  Pulse: (!) 47 (!) 136 (!) 130 (!) 128  Resp: (!) 22 (!) 24 (!) 21 14  Temp:      TempSrc:      SpO2: 99% 98% 97% 98%  Weight:      Height:        Physical Exam Vitals and nursing note reviewed.  Constitutional:      General: She is not in acute distress.    Appearance: Normal appearance. She is normal weight. She is not ill-appearing, toxic-appearing or diaphoretic.  HENT:     Head: Normocephalic and atraumatic.     Nose: Nose normal. No rhinorrhea.  Eyes:     General:        Right eye: No discharge.        Left eye: No discharge.  Cardiovascular:     Rate and Rhythm: Tachycardia present. Rhythm irregular.  Pulmonary:     Effort: Pulmonary effort is normal. No respiratory distress.     Breath sounds: Normal breath sounds. No wheezing.  Abdominal:     General: Abdomen is flat. Bowel sounds are normal. There is no distension.     Palpations: Abdomen is soft.     Tenderness: There is no abdominal tenderness. There is no guarding or rebound.  Musculoskeletal:     Right lower leg: No edema.     Left lower leg: No edema.  Skin:    General: Skin is warm and dry.     Capillary Refill: Capillary refill takes less than 2 seconds.  Neurological:     General: No focal deficit present.     Mental Status: She is alert and oriented to person, place, and time.     Labs on Admission:  I have personally reviewed following labs and imaging studies  CBC: Recent Labs  Lab 12/23/2020 1626  WBC 13.7*  NEUTROABS 8.1*  HGB 10.5*  HCT 32.6*  MCV 108.3*  PLT [id number]   Basic Metabolic Panel: Recent Labs  Lab 12/07/2020 1626  NA 138  K 4.2  CL 110  CO2 16*  GLUCOSE 96  BUN 35*  CREATININE 6.23*  CALCIUM 9.5  MG 1.7   GFR: Estimated Creatinine Clearance: 7 mL/min (  A) (by C-G formula based on SCr of 6.23 mg/dL (H)). Liver Function Tests: Recent Labs  Lab 12/30/2020 1626  AST 19  ALT 10  ALKPHOS 120  BILITOT 0.6  PROT 6.5  ALBUMIN 3.1*   No results for input(s): LIPASE, AMYLASE in the last 168 hours. No results for input(s): AMMONIA in the last 168 hours. Coagulation Profile: No results for input(s): INR, PROTIME in the last 168 hours. Cardiac Enzymes: No results for input(s): CKTOTAL, CKMB, CKMBINDEX, TROPONINI in the last 168 hours. BNP (last 3 results) No results for input(s): PROBNP in the last 8760 hours. HbA1C: No results for input(s): HGBA1C in the last 72 hours. CBG: No results for input(s): GLUCAP in the last 168 hours. Lipid Profile: No results for input(s): CHOL, HDL, LDLCALC, TRIG, CHOLHDL, LDLDIRECT in the last 72 hours. Thyroid Function Tests: No results for input(s): TSH, T4TOTAL, FREET4, T3FREE, THYROIDAB in the last 72 hours. Anemia Panel: No results for input(s): VITAMINB12, FOLATE, FERRITIN, TIBC, IRON, RETICCTPCT in the last 72 hours. Urine analysis:    Component Value Date/Time   COLORURINE STRAW (A) 06/23/2020 1452   APPEARANCEUR CLEAR 06/23/2020 1452   LABSPEC 1.004 (L) 06/23/2020 1452   PHURINE 6.0 06/23/2020 1452   GLUCOSEU NEGATIVE 06/23/2020 1452   HGBUR SMALL (A) 06/23/2020 1452   BILIRUBINUR NEGATIVE 06/23/2020 1452   KETONESUR NEGATIVE 06/23/2020 1452   PROTEINUR 30 (A) 06/23/2020 1452   UROBILINOGEN 0.2 02/19/2009 1839   NITRITE NEGATIVE 06/23/2020 1452   LEUKOCYTESUR NEGATIVE 06/23/2020 1452    Radiological Exams on  Admission: I have personally reviewed images DG Chest Portable 1 View  Result Date: 12/18/2020 CLINICAL DATA:  Palpitations EXAM: PORTABLE CHEST 1 VIEW COMPARISON:  06/25/2020 FINDINGS: The heart size and mediastinal contours are within normal limits. Both lungs are clear. The visualized skeletal structures are unremarkable. IMPRESSION: No acute abnormality of the lungs in AP portable projection. Electronically Signed   By: Delanna Ahmadi M.D.   On: 12/18/2020 16:44    EKG: I have personally reviewed EKG: rapid afib   Assessment/Plan Principal Problem:   Atrial fibrillation with rapid ventricular response (HCC) Active Problems:   Acute renal failure superimposed on stage 5 chronic kidney disease, not on chronic dialysis ([place])   Hypothyroidism   Hypertension    Atrial fibrillation with rapid ventricular response ([place]) Admit to progressive bed. IV cardizem gtts. Check echo. Check TSH. Cardiology has been consulted by EDP. On IV heparin gtts for anticoagulation. Elevated troponin likely demand ischemia from tachycardia. Pt without chest pain.  Acute renal failure superimposed on stage 5 chronic kidney disease, not on chronic dialysis ([place]) Gentle IVF. Nephrology consulted by EDP.  Hypothyroidism Check TSH.   Hypertension On cardizem gtts. Hold norvasc.  DVT prophylaxis: IV heparin gtts Code Status: Full Code Family Communication: no family at bedside  Disposition Plan: return home  Consults called: nephrology and cardiology consulted by EDP  Admission status: Inpatient, Step Down Unit   Kristopher Oppenheim, DO [facility] 12/27/2020, 7:43 PM

## 2020-12-19 NOTE — Assessment & Plan Note (Addendum)
Gentle IVF. Nephrology consulted by EDP. Defer to nephrology regarding her ARF on CKD.

## 2020-12-19 NOTE — ED Notes (Signed)
Pt stated she was having some CP/discomfort & refused wanting pain medications, she was encouraged to find a comfortable position to lay & if she changed her mind to use her call bell to ask for assistance.

## 2020-12-19 NOTE — ED Triage Notes (Signed)
Pt states hx of paroxismal afib.  Took propanolol this am with no releif.  Felt as if her hr was 45.  150 in triage.

## 2020-12-20 ENCOUNTER — Encounter (HOSPITAL_COMMUNITY): Admission: EM | Disposition: E | Payer: Self-pay | Source: Home / Self Care | Attending: Critical Care Medicine

## 2020-12-20 DIAGNOSIS — I495 Sick sinus syndrome: Secondary | ICD-10-CM | POA: Diagnosis not present

## 2020-12-20 DIAGNOSIS — R778 Other specified abnormalities of plasma proteins: Secondary | ICD-10-CM | POA: Insufficient documentation

## 2020-12-20 DIAGNOSIS — I498 Other specified cardiac arrhythmias: Secondary | ICD-10-CM

## 2020-12-20 DIAGNOSIS — I4891 Unspecified atrial fibrillation: Secondary | ICD-10-CM | POA: Diagnosis not present

## 2020-12-20 DIAGNOSIS — N185 Chronic kidney disease, stage 5: Secondary | ICD-10-CM | POA: Diagnosis not present

## 2020-12-20 HISTORY — PX: TEMPORARY PACEMAKER: CATH118268

## 2020-12-20 LAB — COMPREHENSIVE METABOLIC PANEL
ALT: 8 U/L (ref 0–44)
AST: 15 U/L (ref 15–41)
Albumin: 2.6 g/dL — ABNORMAL LOW (ref 3.5–5.0)
Alkaline Phosphatase: 109 U/L (ref 38–126)
Anion gap: 11 (ref 5–15)
BUN: 33 mg/dL — ABNORMAL HIGH (ref 8–23)
CO2: 14 mmol/L — ABNORMAL LOW (ref 22–32)
Calcium: 9 mg/dL (ref 8.9–10.3)
Chloride: 112 mmol/L — ABNORMAL HIGH (ref 98–111)
Creatinine, Ser: 5.64 mg/dL — ABNORMAL HIGH (ref 0.44–1.00)
GFR, Estimated: 8 mL/min — ABNORMAL LOW (ref >60)
Glucose, Bld: 90 mg/dL (ref 70–99)
Potassium: 3.9 mmol/L (ref 3.5–5.1)
Sodium: 137 mmol/L (ref 135–145)
Total Bilirubin: 0.4 mg/dL (ref 0.3–1.2)
Total Protein: 5.9 g/dL — ABNORMAL LOW (ref 6.5–8.1)

## 2020-12-20 LAB — HIV ANTIBODY (ROUTINE TESTING W REFLEX): HIV Screen 4th Generation wRfx: NONREACTIVE

## 2020-12-20 LAB — CBC WITH DIFFERENTIAL/PLATELET
Abs Immature Granulocytes: 0.06 10*3/uL (ref 0.00–0.07)
Basophils Absolute: 0.1 10*3/uL (ref 0.0–0.1)
Basophils Relative: 1 %
Eosinophils Absolute: 0.2 10*3/uL (ref 0.0–0.5)
Eosinophils Relative: 2 %
HCT: 30.7 % — ABNORMAL LOW (ref 36.0–46.0)
Hemoglobin: 10.1 g/dL — ABNORMAL LOW (ref 12.0–15.0)
Immature Granulocytes: 1 %
Lymphocytes Relative: 38 %
Lymphs Abs: 4.4 10*3/uL — ABNORMAL HIGH (ref 0.7–4.0)
MCH: 35.4 pg — ABNORMAL HIGH (ref 26.0–34.0)
MCHC: 32.9 g/dL (ref 30.0–36.0)
MCV: 107.7 fL — ABNORMAL HIGH (ref 80.0–100.0)
Monocytes Absolute: 1 10*3/uL (ref 0.1–1.0)
Monocytes Relative: 9 %
Neutro Abs: 5.8 10*3/uL (ref 1.7–7.7)
Neutrophils Relative %: 49 %
Platelets: 374 10*3/uL (ref 150–400)
RBC: 2.85 MIL/uL — ABNORMAL LOW (ref 3.87–5.11)
RDW: 16.4 % — ABNORMAL HIGH (ref 11.5–15.5)
WBC: 11.5 10*3/uL — ABNORMAL HIGH (ref 4.0–10.5)
nRBC: 0.3 % — ABNORMAL HIGH (ref 0.0–0.2)

## 2020-12-20 LAB — TROPONIN I (HIGH SENSITIVITY)
Troponin I (High Sensitivity): 1457 ng/L (ref <18)
Troponin I (High Sensitivity): 1294 ng/L (ref <18)

## 2020-12-20 LAB — TSH: TSH: 4.122 u[IU]/mL (ref 0.350–4.500)

## 2020-12-20 LAB — HEPARIN LEVEL (UNFRACTIONATED)
Heparin Unfractionated: 0.15 IU/mL — ABNORMAL LOW (ref 0.30–0.70)
Heparin Unfractionated: <0.1 IU/mL — ABNORMAL LOW (ref 0.30–0.70)

## 2020-12-20 LAB — MAGNESIUM: Magnesium: 2.4 mg/dL (ref 1.7–2.4)

## 2020-12-20 LAB — MRSA NEXT GEN BY PCR, NASAL: MRSA by PCR Next Gen: NOT DETECTED

## 2020-12-20 SURGERY — TEMPORARY PACEMAKER
Anesthesia: LOCAL

## 2020-12-20 MED ORDER — SODIUM CHLORIDE 0.9% FLUSH
10.0000 mL | Freq: Two times a day (BID) | INTRAVENOUS | Status: DC
  Administered 2020-12-21 – 2020-12-26 (×8): 10 mL
  Administered 2020-12-27: 40 mL
  Administered 2020-12-27 – 2020-12-31 (×7): 10 mL

## 2020-12-20 MED ORDER — ZOLPIDEM TARTRATE 5 MG PO TABS
5.0000 mg | ORAL_TABLET | Freq: Every evening | ORAL | Status: DC | PRN
  Administered 2020-12-20 – 2020-12-22 (×3): 5 mg via ORAL
  Filled 2020-12-20 (×3): qty 1

## 2020-12-20 MED ORDER — LIDOCAINE HCL (PF) 1 % IJ SOLN
INTRAMUSCULAR | Status: AC
  Filled 2020-12-20: qty 30

## 2020-12-20 MED ORDER — SODIUM CHLORIDE 0.9% FLUSH: 10.0000 mL | INTRAVENOUS | Status: DC | PRN

## 2020-12-20 MED ORDER — SODIUM CHLORIDE 0.9 % IV SOLN
INTRAVENOUS | Status: AC | PRN
  Administered 2020-12-20: 10 mL/h via INTRAVENOUS

## 2020-12-20 MED ORDER — HEPARIN (PORCINE) IN NACL 1000-0.9 UT/500ML-% IV SOLN
INTRAVENOUS | Status: DC | PRN
  Administered 2020-12-20: 500 mL

## 2020-12-20 MED ORDER — STERILE WATER FOR INJECTION IV SOLN
INTRAVENOUS | Status: DC
  Filled 2020-12-20 (×4): qty 1000

## 2020-12-20 MED ORDER — DILTIAZEM HCL-DEXTROSE 125-5 MG/125ML-% IV SOLN (PREMIX)
5.0000 mg/h | INTRAVENOUS | Status: DC
  Administered 2020-12-20: 5 mg/h via INTRAVENOUS
  Administered 2020-12-21: 15 mg/h via INTRAVENOUS
  Filled 2020-12-20 (×3): qty 125

## 2020-12-20 MED ORDER — SODIUM CHLORIDE 0.9 % IV SOLN: INTRAVENOUS | Status: DC

## 2020-12-20 MED ORDER — LIDOCAINE HCL (PF) 1 % IJ SOLN
INTRAMUSCULAR | Status: DC | PRN
  Administered 2020-12-20: 2 mL via INTRADERMAL

## 2020-12-20 MED ORDER — SODIUM CHLORIDE 0.9 % IV SOLN
INTRAVENOUS | Status: AC
  Filled 2020-12-20: qty 250

## 2020-12-20 MED ORDER — CHLORHEXIDINE GLUCONATE CLOTH 2 % EX PADS
6.0000 | MEDICATED_PAD | Freq: Every day | CUTANEOUS | Status: DC
  Administered 2020-12-20: 6 via TOPICAL

## 2020-12-20 MED ORDER — HEPARIN (PORCINE) IN NACL 1000-0.9 UT/500ML-% IV SOLN
INTRAVENOUS | Status: AC
  Filled 2020-12-20: qty 500

## 2020-12-20 SURGICAL SUPPLY — 8 items
CABLE ADAPT PACING TEMP 12FT (ADAPTER) ×1 IMPLANT
CATH S G BIP PACING (CATHETERS) ×1 IMPLANT
KIT MICROPUNCTURE NIT STIFF (SHEATH) ×1 IMPLANT
PACK CARDIAC CATHETERIZATION (CUSTOM PROCEDURE TRAY) ×2 IMPLANT
PINNACLE LONG 6F 25CM (SHEATH) ×2
SHEATH INTRO PINNACLE 6F 25CM (SHEATH) IMPLANT
SHEATH PINNACLE 6F 10CM (SHEATH) ×1 IMPLANT
SLEEVE REPOSITIONING LENGTH 30 (MISCELLANEOUS) ×1 IMPLANT

## 2020-12-20 NOTE — Consult Note (Signed)
[facility] Nephrology Consultation Note  Requesting MD: Dr. Candiss Norse, P Reason for consult: AKI on CKD  HPI:  Debra Petersen is a 70 y.o. female with history of hypertension, atrial flutter status post ablation, SVT, lupus nephritis now CKD stage V, anemia on ESA treatment was presented with palpitation, near syncope and weakness in the setting of A. fib with RVR, seen as a consultation for the evaluation of acute kidney injury on CKD. Patient reported that she was diagnosed with lupus nephritis in 1990s when she was treated with Cytoxan and other immunosuppression.  Since then she was followed closely with nephrologist at [facility], recently follows with Dr. Johnney Ou.  She is undergoing evaluation for kidney transplant at [facility].  No permanent access placed for the preparation of dialysis.  It seems like her creatinine level around 4 and has been stable.  She receives IV iron and erythropoietin for the management of anemia. Patient was having symptoms of weakness for about 2 days associated with the palpitation, dizziness and overall not feeling well.  In the ER, the heart rate was 175 with blood pressure of 152/91, saturation 100% in room air.  EKG demonstrating A. fib with RVR.  She was a started on Cardizem drip, heparin drip and cardiology was consulted.  The patient is started having significant pauses with episodes of nearest syncope therefore temporary pacemaker was placed at this morning. The labs showed creatinine level elevated to 6.23, BUN 35, CO2 16, potassium 4.2, troponin elevated at 1843. Urinalysis with minimal protein without hematuria.  She had kidney ultrasound done in 06/2020 with chronic finding consistent with CKD without hydronephrosis. Patient is now admitted to cardiac ICU.  Still having tachycardia.  She feels some palpitation however denies chest pain, shortness of breath, nausea, vomiting, dysgeusia.  She has no lower extremity edema. The home  medications include calcitriol, sodium bicarbonate, oral iron, statin and Norvasc at home.  Not on diuretics.  Creatinine, Ser  Date/Time Value Ref Range Status  12/05/2020 04:39 AM 5.64 (H) 0.44 - 1.00 mg/dL Final  12/09/2020 04:26 PM 6.23 (H) 0.44 - 1.00 mg/dL Final  07/01/2020 01:10 AM 4.21 (H) 0.44 - 1.00 mg/dL Final    PMHx:   Past Medical History:  Diagnosis Date   CKD (chronic kidney disease)    Lupus (systemic lupus erythematosus) (HCC)    Wide-complex tachycardia    probable atrial flutter with 1-1 and 2-1 conduction    Past Surgical History:  Procedure Laterality Date   ABDOMINAL HYSTERECTOMY     CARDIAC ELECTROPHYSIOLOGY STUDY AND ABLATION  02/23/2009   of typical and atypical flutter   COLON SURGERY     GALLBLADDER SURGERY     hystoectomy     SPLENECTOMY, TOTAL      Family Hx:  Family History  Problem Relation Age of Onset   Coronary artery disease Mother        had a pacemaker   Coronary artery disease Father     Social History:  reports that she has never smoked. She has never used smokeless tobacco. She reports that she does not currently use alcohol. She reports that she does not use drugs.  Allergies:  Allergies  Allergen Reactions   Codeine Itching    Medications: Prior to Admission medications   Medication Sig Start Date End Date Taking? Authorizing Provider  acetaminophen (TYLENOL) 500 MG tablet Take 1,000 mg by mouth every 6 (six) hours as needed for mild pain.   Yes [provider]  amLODipine (NORVASC) 5 MG tablet Take 5 mg by mouth in the morning and at bedtime.   Yes [provider]  calcitRIOL (ROCALTROL) 0.25 MCG capsule Take 1 capsule (0.25 mcg total) by mouth daily. 06/26/20  Yes Florencia Reasons, MD  OVER THE COUNTER MEDICATION Take 1 Scoop by mouth daily. Blueberry supplement   Yes [provider]  OVER THE COUNTER MEDICATION Take 1 Scoop by mouth daily. Kale and spinach supplement   Yes [provider]   pravastatin (PRAVACHOL) 20 MG tablet Take 20 mg by mouth daily.   Yes [provider]  promethazine (PHENERGAN) 25 MG tablet Take 25 mg by mouth every 8 (eight) hours as needed for nausea. 06/23/20  Yes [provider]  propafenone (RYTHMOL) 150 MG tablet TAKE 1 TABLET BY MOUTH TWICE DAILY. Patient taking differently: Take 150 mg by mouth in the morning and at bedtime. 04/14/20  Yes Deboraha Sprang, MD  sodium bicarbonate 650 MG tablet Take 2 tablets (1,300 mg total) by mouth 3 (three) times daily. Patient taking differently: Take 1,300 mg by mouth 4 (four) times daily. 06/25/20  Yes Florencia Reasons, MD  ferrous sulfate 325 (65 FE) MG tablet Take 1 tablet (325 mg total) by mouth daily with breakfast. Patient not taking: No sig reported 06/25/20   Florencia Reasons, MD  Potassium Chloride ER 20 MEQ TBCR Take 20 mEq by mouth daily for 3 days. Patient not taking: Reported on 12/17/2020 06/25/20 06/28/20  Florencia Reasons, MD    I have reviewed the patient's current medications.  Labs:  Results for orders placed or performed during the hospital encounter of 01/04/2021 (from the past 48 hour(s))  Comprehensive metabolic panel     Status: Abnormal   Collection Time: 01/02/2021  4:26 PM  Result Value Ref Range   Sodium 138 135 - 145 mmol/L   Potassium 4.2 3.5 - 5.1 mmol/L   Chloride 110 98 - 111 mmol/L   CO2 16 (L) 22 - 32 mmol/L   Glucose, Bld 96 70 - 99 mg/dL    Comment: Glucose reference range applies only to samples taken after fasting for at least 8 hours.   BUN 35 (H) 8 - 23 mg/dL   Creatinine, Ser 6.23 (H) 0.44 - 1.00 mg/dL   Calcium 9.5 8.9 - 10.3 mg/dL   Total Protein 6.5 6.5 - 8.1 g/dL   Albumin 3.1 (L) 3.5 - 5.0 g/dL   AST 19 15 - 41 U/L   ALT 10 0 - 44 U/L   Alkaline Phosphatase 120 38 - 126 U/L   Total Bilirubin 0.6 0.3 - 1.2 mg/dL   GFR, Estimated 7 (L) >60 mL/min    Comment: (NOTE) Calculated using the CKD-EPI Creatinine Equation (2021)    Anion gap 12 5 - 15    Comment: Performed at  [facility] [address], [place], [place] [address]  Magnesium     Status: None   Collection Time: 12/12/2020  4:26 PM  Result Value Ref Range   Magnesium 1.7 1.7 - 2.4 mg/dL    Comment: Performed at [facility], [place] [address], [place], [place] [address]  CBC with Differential     Status: Abnormal   Collection Time: 12/23/2020  4:26 PM  Result Value Ref Range   WBC 13.7 (H) 4.0 - 10.5 K/uL   RBC 3.01 (L) 3.87 - 5.11 MIL/uL   Hemoglobin 10.5 (L) 12.0 - 15.0 g/dL   HCT 32.6 (L) 36.0 -  46.0 %   MCV 108.3 (H) 80.0 - 100.0 fL   MCH 34.9 (H) 26.0 - 34.0 pg   MCHC 32.2 30.0 - 36.0 g/dL   RDW 16.3 (H) 11.5 - 15.5 %   Platelets 352 150 - 400 K/uL   nRBC 0.0 0.0 - 0.2 %   Neutrophils Relative % 58 %   Neutro Abs 8.1 (H) 1.7 - 7.7 K/uL   Lymphocytes Relative 29 %   Lymphs Abs 4.0 0.7 - 4.0 K/uL   Monocytes Relative 10 %   Monocytes Absolute 1.3 (H) 0.1 - 1.0 K/uL   Eosinophils Relative 1 %   Eosinophils Absolute 0.1 0.0 - 0.5 K/uL   Basophils Relative 1 %   Basophils Absolute 0.1 0.0 - 0.1 K/uL   Immature Granulocytes 1 %   Abs Immature Granulocytes 0.07 0.00 - 0.07 K/uL    Comment: Performed at [facility] [address], [place], [place] [address]  Troponin I (High Sensitivity)     Status: Abnormal   Collection Time: 12/27/2020  4:26 PM  Result Value Ref Range   Troponin I (High Sensitivity) 1,843 (HH) <18 ng/L    Comment: CRITICAL RESULT CALLED TO, READ BACK BY AND VERIFIED WITH: P WILLIAMS RN BY SSTEPHENS [id number] (NOTE) Elevated high sensitivity troponin I (hsTnI) values and significant  changes across serial measurements may suggest ACS but many other  chronic and acute conditions are known to elevate hsTnI results.  Refer to the Links section for chest pain algorithms and additional  guidance. Performed at [facility], [place] [address], [place], [place] [address]   Troponin I (High Sensitivity)     Status: Abnormal   Collection Time: 01/03/2021   7:40 PM  Result Value Ref Range   Troponin I (High Sensitivity) 1,658 (HH) <18 ng/L    Comment: CRITICAL VALUE NOTED.  VALUE IS CONSISTENT WITH PREVIOUSLY REPORTED AND CALLED VALUE. (NOTE) Elevated high sensitivity troponin I (hsTnI) values and significant  changes across serial measurements may suggest ACS but many other  chronic and acute conditions are known to elevate hsTnI results.  Refer to the Links section for chest pain algorithms and additional  guidance. Performed at [facility], [place] [address] [place], [place] [address]   Resp Panel by RT-PCR (Flu A&B, Covid) Nasopharyngeal Swab     Status: None   Collection Time: 01/03/2021  8:38 PM   Specimen: Nasopharyngeal Swab; Nasopharyngeal(NP) swabs in vial transport medium  Result Value Ref Range   SARS Coronavirus 2 by RT PCR NEGATIVE NEGATIVE    Comment: (NOTE) SARS-CoV-2 target nucleic acids are NOT DETECTED.  The SARS-CoV-2 RNA is generally detectable in upper respiratory specimens during the acute phase of infection. The lowest concentration of SARS-CoV-2 viral copies this assay can detect is 138 copies/mL. A negative result does not preclude SARS-Cov-2 infection and should not be used as the sole basis for treatment or other patient management decisions. A negative result may occur with  improper specimen collection/handling, submission of specimen other than nasopharyngeal swab, presence of viral mutation(s) within the areas targeted by this assay, and inadequate number of viral copies(<138 copies/mL). A negative result must be combined with clinical observations, patient history, and epidemiological information. The expected result is Negative.  Fact Sheet for Patients:  EntrepreneurPulse.com.au  Fact Sheet for Healthcare Providers:  IncredibleEmployment.be  This test is no t yet approved or cleared by the [place] FDA and  has been authorized for detection and/or  diagnosis of SARS-CoV-2 by FDA under an Emergency Use Authorization (EUA). This EUA will remain  in effect (meaning this test can be used) for the duration of the COVID-19 declaration under Section 564(b)(1) of the Act, 21 U.S.C.section 360bbb-3(b)(1), unless the authorization is terminated  or revoked sooner.       Influenza A by PCR NEGATIVE NEGATIVE   Influenza B by PCR NEGATIVE NEGATIVE    Comment: (NOTE) The Xpert Xpress SARS-CoV-2/FLU/RSV plus assay is intended as an aid in the diagnosis of influenza from Nasopharyngeal swab specimens and should not be used as a sole basis for treatment. Nasal washings and aspirates are unacceptable for Xpert Xpress SARS-CoV-2/FLU/RSV testing.  Fact Sheet for Patients: EntrepreneurPulse.com.au  Fact Sheet for Healthcare Providers: IncredibleEmployment.be  This test is not yet approved or cleared by the [place] FDA and has been authorized for detection and/or diagnosis of SARS-CoV-2 by FDA under an Emergency Use Authorization (EUA). This EUA will remain in effect (meaning this test can be used) for the duration of the COVID-19 declaration under Section 564(b)(1) of the Act, 21 U.S.C. section 360bbb-3(b)(1), unless the authorization is terminated or revoked.  Performed at [facility], [place] [address] [place], [place] [address]   Lipid panel     Status: None   Collection Time: 12/18/2020 10:08 PM  Result Value Ref Range   Cholesterol 121 0 - 200 mg/dL   Triglycerides 54 <150 mg/dL   HDL 45 >40 mg/dL   Total CHOL/HDL Ratio 2.7 RATIO   VLDL 11 0 - 40 mg/dL   LDL Cholesterol 65 0 - 99 mg/dL    Comment:        Total Cholesterol/HDL:CHD Risk Coronary Heart Disease Risk Table                     Men   Women  1/2 Average Risk   3.4   3.3  Average Risk       5.0   4.4  2 X Average Risk   9.6   7.1  3 X Average Risk  23.4   11.0        Use the calculated Patient Ratio above and the CHD Risk  Table to determine the patient's CHD Risk.        ATP III CLASSIFICATION (LDL):  <100     mg/dL   Optimal  100-129  mg/dL   Near or Above                    Optimal  130-159  mg/dL   Borderline  160-189  mg/dL   High  >190     mg/dL   Very High Performed at [facility] [address], [place], [place] [address]   CBC with Differential/Platelet     Status: Abnormal   Collection Time: 12/25/2020  4:39 AM  Result Value Ref Range   WBC 11.5 (H) 4.0 - 10.5 K/uL   RBC 2.85 (L) 3.87 - 5.11 MIL/uL   Hemoglobin 10.1 (L) 12.0 - 15.0 g/dL   HCT 30.7 (L) 36.0 - 46.0 %   MCV 107.7 (H) 80.0 - 100.0 fL   MCH 35.4 (H) 26.0 - 34.0 pg   MCHC 32.9 30.0 - 36.0 g/dL   RDW 16.4 (H) 11.5 - 15.5 %   Platelets 374 150 - 400 K/uL   nRBC 0.3 (H) 0.0 - 0.2 %   Neutrophils Relative % 49 %   Neutro Abs  5.8 1.7 - 7.7 K/uL   Lymphocytes Relative 38 %   Lymphs Abs 4.4 (H) 0.7 - 4.0 K/uL   Monocytes Relative 9 %   Monocytes Absolute 1.0 0.1 - 1.0 K/uL   Eosinophils Relative 2 %   Eosinophils Absolute 0.2 0.0 - 0.5 K/uL   Basophils Relative 1 %   Basophils Absolute 0.1 0.0 - 0.1 K/uL   Immature Granulocytes 1 %   Abs Immature Granulocytes 0.06 0.00 - 0.07 K/uL    Comment: Performed at [facility] [address] [place], [place] [address]  Comprehensive metabolic panel     Status: Abnormal   Collection Time: 12/16/2020  4:39 AM  Result Value Ref Range   Sodium 137 135 - 145 mmol/L   Potassium 3.9 3.5 - 5.1 mmol/L   Chloride 112 (H) 98 - 111 mmol/L   CO2 14 (L) 22 - 32 mmol/L   Glucose, Bld 90 70 - 99 mg/dL    Comment: Glucose reference range applies only to samples taken after fasting for at least 8 hours.   BUN 33 (H) 8 - 23 mg/dL   Creatinine, Ser 5.64 (H) 0.44 - 1.00 mg/dL   Calcium 9.0 8.9 - 10.3 mg/dL   Total Protein 5.9 (L) 6.5 - 8.1 g/dL   Albumin 2.6 (L) 3.5 - 5.0 g/dL   AST 15 15 - 41 U/L   ALT 8 0 - 44 U/L   Alkaline Phosphatase 109 38 - 126 U/L   Total Bilirubin 0.4 0.3 -  1.2 mg/dL   GFR, Estimated 8 (L) >60 mL/min    Comment: (NOTE) Calculated using the CKD-EPI Creatinine Equation (2021)    Anion gap 11 5 - 15    Comment: Performed at [facility], [place] [address], [place], [place] [address]  Magnesium     Status: None   Collection Time: 12/27/2020  4:39 AM  Result Value Ref Range   Magnesium 2.4 1.7 - 2.4 mg/dL    Comment: Performed at [facility] [address], [place], [place] [address]  Troponin I (High Sensitivity)     Status: Abnormal   Collection Time: 01/02/2021  4:39 AM  Result Value Ref Range   Troponin I (High Sensitivity) 1,457 (HH) <18 ng/L    Comment: CRITICAL VALUE NOTED.  VALUE IS CONSISTENT WITH PREVIOUSLY REPORTED AND CALLED VALUE. (NOTE) Elevated high sensitivity troponin I (hsTnI) values and significant  changes across serial measurements may suggest ACS but many other  chronic and acute conditions are known to elevate hsTnI results.  Refer to the Links section for chest pain algorithms and additional  guidance. Performed at [facility], [place] [address] [place], [place] [address]      ROS:  Pertinent items noted in HPI and remainder of comprehensive ROS otherwise negative.  Physical Exam: Vitals:   12/26/2020 0731 12/15/2020 0736  BP: 139/83   Pulse: (!) 138 (!) 134  Resp: 14 (!) 7  Temp:    SpO2: 100% (!) 0%     General exam: Appears calm and comfortable  Respiratory system: Clear to auscultation. Respiratory effort normal. No wheezing or crackle Cardiovascular system: S1 & S2 heard, tachycardic.  No pedal edema. Gastrointestinal system: Abdomen is nondistended, soft and nontender. Normal bowel sounds heard. Central nervous system: Alert and oriented. No focal neurological deficits. Extremities: Symmetric 5 x 5 power. Skin: No rashes, lesions or ulcers Psychiatry: Judgement and insight appear normal. Mood & affect appropriate.  Assessment/Plan:  #Acute kidney injury on CKD stage V hemodynamically  mediated in the setting of A. fib with RVR.  CKD stage V due to longstanding lupus nephritis and was followed closely by Dr. Johnney Ou at [facility].  UA with minimal protein without hematuria.  The creatinine level is already downtrending today.  The patient is not uremic and volume status looks acceptable.  No urgent need for dialysis. I will order bladder scan and recommend strict ins and outs, daily lab monitor.  Gentle IV hydration with sodium bicarbonate today.  #A. fib with RVR/tachybradycardia syndrome.: IV diltiazem and heparin.  She developed significant pause therefore temporary pacemaker was placed.  Cardiology is following closely.  #Metabolic acidosis: She is on oral sodium bicarbonate at home.  I will add some sodium bicarbonate with IV fluid.  Monitor CO2 level.  #Anemia of CKD: Hemoglobin at goal.  The patient is on ESA as outpatient.  I will repeat iron studies.  #History of hypertension: Blood pressure acceptable.  She is on IV Cardizem to control heart rate.  Discussed with the primary team. Thank you for the consult.  We will continue to follow.  Chrys Landgrebe Tanna Furry 12/08/2020, 8:41 AM  [employer].

## 2020-12-20 NOTE — Consult Note (Signed)
Cardiology Consultation:   Patient ID: Kyley Kovacevich MRN: [id number]; DOB: 06-17-1950  Admit date: 12/07/2020 Date of Consult: 12/06/2020  PCP:  Prince Solian, MD   [facility] Providers Cardiologist:  None        Patient Profile:   Katalyn Consiglio is a 70 y.o. female with a hx of hypertension, CKD stage V, lupus nephritis, atrial flutter status post ablation, atrial fibrillation who is being seen 12/25/2020 for the evaluation of near syncope with pauses at the request of Phillis Haggis.  History of Present Illness:   Ms. Ogrady presented to the hospital with weakness and palpitations for the last 2 days.  Palpitations have been intermittent.  She is also had episodes of near syncope.  On presentation in the emergency room, she was found to be in rapid atrial fibrillation.  She is admitted to the hospitalist service as she does have multiple other medical problems.  She was started on diltiazem, but started to have significant pauses with episodes of near syncope.  She is now status post temporary wire implanted last night.  She continues to have episodic pauses, not on any rate controlling medications with pacing during the pauses.  She feels much improved since a temporary wire was placed.  She does still have palpitations and feels jittery.   Past Medical History:  Diagnosis Date   CKD (chronic kidney disease)    Lupus (systemic lupus erythematosus) (HCC)    Wide-complex tachycardia    probable atrial flutter with 1-1 and 2-1 conduction    Past Surgical History:  Procedure Laterality Date   ABDOMINAL HYSTERECTOMY     CARDIAC ELECTROPHYSIOLOGY STUDY AND ABLATION  02/23/2009   of typical and atypical flutter   COLON SURGERY     GALLBLADDER SURGERY     hystoectomy     SPLENECTOMY, TOTAL       Home Medications:  Prior to Admission medications   Medication Sig Start Date End Date Taking? Authorizing Provider  acetaminophen (TYLENOL) 500 MG tablet Take 1,000 mg by mouth every  6 (six) hours as needed for mild pain.   Yes [provider]  amLODipine (NORVASC) 5 MG tablet Take 5 mg by mouth in the morning and at bedtime.   Yes [provider]  calcitRIOL (ROCALTROL) 0.25 MCG capsule Take 1 capsule (0.25 mcg total) by mouth daily. 06/26/20  Yes Florencia Reasons, MD  OVER THE COUNTER MEDICATION Take 1 Scoop by mouth daily. Blueberry supplement   Yes [provider]  OVER THE COUNTER MEDICATION Take 1 Scoop by mouth daily. Kale and spinach supplement   Yes [provider]  pravastatin (PRAVACHOL) 20 MG tablet Take 20 mg by mouth daily.   Yes [provider]  promethazine (PHENERGAN) 25 MG tablet Take 25 mg by mouth every 8 (eight) hours as needed for nausea. 06/23/20  Yes [provider]  propafenone (RYTHMOL) 150 MG tablet TAKE 1 TABLET BY MOUTH TWICE DAILY. Patient taking differently: Take 150 mg by mouth in the morning and at bedtime. 04/14/20  Yes Deboraha Sprang, MD  sodium bicarbonate 650 MG tablet Take 2 tablets (1,300 mg total) by mouth 3 (three) times daily. Patient taking differently: Take 1,300 mg by mouth 4 (four) times daily. 06/25/20  Yes Florencia Reasons, MD  ferrous sulfate 325 (65 FE) MG tablet Take 1 tablet (325 mg total) by mouth daily with breakfast. Patient not taking: No sig reported 06/25/20   Florencia Reasons, MD  Potassium Chloride ER 20 MEQ TBCR Take  20 mEq by mouth daily for 3 days. Patient not taking: Reported on 01/04/2021 06/25/20 06/28/20  Florencia Reasons, MD    Inpatient Medications: Scheduled Meds:  Chlorhexidine Gluconate Cloth  6 each Topical Daily   pravastatin  20 mg Oral Daily   sodium bicarbonate  1,300 mg Oral TID   Continuous Infusions:  heparin 800 Units/hr (12/26/2020 0900)   PRN Meds: acetaminophen **OR** acetaminophen, melatonin, ondansetron **OR** ondansetron (ZOFRAN) IV, traMADol  Allergies:    Allergies  Allergen Reactions   Codeine Itching    Social History:   Social History   Socioeconomic  History   Marital status: Widowed    Spouse name: Not on file   Number of children: Not on file   Years of education: Not on file   Highest education level: Not on file  Occupational History   Not on file  Tobacco Use   Smoking status: Never   Smokeless tobacco: Never  Vaping Use   Vaping Use: Never used  Substance and Sexual Activity   Alcohol use: Not Currently   Drug use: Never   Sexual activity: Not on file  Other Topics Concern   Not on file  Social History Narrative   Not on file   Social Determinants of Health   Financial Resource Strain: Not on file  Food Insecurity: Not on file  Transportation Needs: Not on file  Physical Activity: Not on file  Stress: Not on file  Social Connections: Not on file  Intimate Partner Violence: Not on file    Family History:    Family History  Problem Relation Age of Onset   Coronary artery disease Mother        had a pacemaker   Coronary artery disease Father      ROS:  Please see the history of present illness.   All other ROS reviewed and negative.     Physical Exam/Data:   Vitals:   12/16/2020 0736 12/15/2020 0800 12/31/2020 0900 12/10/2020 1000  BP:  116/71 100/82 106/74  Pulse: (!) 134     Resp: (!) '7 19 16 18  '$ Temp:      TempSrc:      SpO2: (!) 0% 96% 99% 93%  Weight:      Height:        Intake/Output Summary (Last 24 hours) at 12/09/2020 1102 Last data filed at 12/09/2020 0900 Gross per 24 hour  Intake 615.58 ml  Output --  Net 615.58 ml   Last 3 Weights 12/22/2020 11/27/2020 06/28/2020  Weight (lbs) 125 lb 122 lb 117 lb 4.6 oz  Weight (kg) 56.7 kg 55.339 kg 53.2 kg     Body mass index is 22.14 kg/m.  General:  Well nourished, well developed, in no acute distress HEENT: normal Neck: no JVD Vascular: No carotid bruits; Distal pulses 2+ bilaterally Cardiac: Tachycardic, regular, no murmurs  lungs:  clear to auscultation bilaterally, no wheezing, rhonchi or rales  Abd: soft, nontender, no hepatomegaly   Ext: no edema Musculoskeletal:  No deformities, BUE and BLE strength normal and equal Skin: warm and dry  Neuro:  CNs 2-12 intact, no focal abnormalities noted Psych:  Normal affect   EKG:  The EKG was personally reviewed and demonstrates: Atrial fibrillation, intermittent junctional rhythm with pauses Telemetry:  Telemetry was personally reviewed and demonstrates: Atrial fibrillation, intermittent ventricular pacing  Relevant CV Studies: 10/28/2019: Lexiscan stress myoview FINDINGS:  Unexpected extra cardiac findings: None.  Defect: No inducible defect to suggest ischemia. No large fixed  defect to suggest transmural scar.   TID: <1.25   Gated rest images:  End diastolic volume: 40 cc  End systolic volume: 12 cc  Ejection fraction: >70%.  Wall motion abnormalities: None.  Gated stress images:  End diastolic volume: 27 cc  End systolic volume: 4 cc  Ejection fraction: >70%.  Wall motion abnormalities: None.     10/23/2019: TTE SUMMARY  The left ventricular size is normal.  LV ejection fraction = 60-65%.  Left ventricular systolic function is normal.  The left ventricular wall motion is normal.  The right ventricle is normal in size and function.  The left and right atrial size are normal.  There is no significant valvular stenosis or regurgitation.  Estimated right ventricular systolic pressure is 31 mmHg.  Estimated right atrial pressure is 5 mmHg.Marland Kitchen  There is no pericardial effusion.  There is no comparison study available.    Laboratory Data:  High Sensitivity Troponin:   Recent Labs  Lab 12/26/2020 1626 01/03/2021 1940 12/31/2020 0439 12/13/2020 0812  TROPONINIHS 1,843* 1,658* 1,457* 1,294*     Chemistry Recent Labs  Lab 12/22/2020 1626 12/16/2020 0439  NA 138 137  K 4.2 3.9  CL 110 112*  CO2 16* 14*  GLUCOSE 96 90  BUN 35* 33*  CREATININE 6.23* 5.64*  CALCIUM 9.5 9.0  MG 1.7 2.4  GFRNONAA 7* 8*  ANIONGAP 12 11    Recent Labs  Lab 12/17/2020 1626  12/09/2020 0439  PROT 6.5 5.9*  ALBUMIN 3.1* 2.6*  AST 19 15  ALT 10 8  ALKPHOS 120 109  BILITOT 0.6 0.4   Lipids  Recent Labs  Lab 12/31/2020 2208  CHOL 121  TRIG 54  HDL 45  LDLCALC 65  CHOLHDL 2.7    Hematology Recent Labs  Lab 12/08/2020 1626 01/02/2021 0439  WBC 13.7* 11.5*  RBC 3.01* 2.85*  HGB 10.5* 10.1*  HCT 32.6* 30.7*  MCV 108.3* 107.7*  MCH 34.9* 35.4*  MCHC 32.2 32.9  RDW 16.3* 16.4*  PLT 352 374   Thyroid  Recent Labs  Lab 12/20/2020 0812  TSH 4.122    BNPNo results for input(s): BNP, PROBNP in the last 168 hours.  DDimer No results for input(s): DDIMER in the last 168 hours.   Radiology/Studies:  CARDIAC CATHETERIZATION  Result Date: 12/15/2020 Successful temporary transvenous pacemaker insertion via right IJ Further plans per rounding team.   DG Chest Portable 1 View  Result Date: 12/17/2020 CLINICAL DATA:  Palpitations EXAM: PORTABLE CHEST 1 VIEW COMPARISON:  06/25/2020 FINDINGS: The heart size and mediastinal contours are within normal limits. Both lungs are clear. The visualized skeletal structures are unremarkable. IMPRESSION: No acute abnormality of the lungs in AP portable projection. Electronically Signed   By: Delanna Ahmadi M.D.   On: 12/26/2020 16:44     Assessment and Plan:   Atrial fibrillation with rapid ventricular response: Patient is currently on IV heparin and diltiazem.  She Graceson Nichelson likely need rhythm control once her pacemaker has been implanted.  Is unclear to me as to how long she has been in atrial fibrillation, though she has had episodes where she converted to sinus rhythm.  It may be possible to start her on amiodarone once her pacemaker has been implanted.  TTE pending. Tachybradycardia syndrome: Patient is having significant pauses.  We Janus Vlcek plan for pacemaker implant tomorrow.  We Manus Weedman make her n.p.o. after midnight.  For her episodes of tachycardia, we Hermela Hardt restart her diltiazem drip. CKD stage V: Nephrology has  been  consulted.  We Kendrix Orman hold off on any studies requiring IV contrast. Elevated troponin: Likely due to demand ischemia.  Patient has not been having chest pain.  Myoview in 2021 without ischemia.  We Lakrista Scaduto continue to monitor.   For questions or updates, please contact [place] Please consult [email or web address] for contact info under    Signed, Brittanee Ghazarian Meredith Leeds, MD  12/23/2020 11:02 AM

## 2020-12-20 NOTE — Progress Notes (Addendum)
PROGRESS NOTE                                                                                                                                                                                                             Patient Demographics:    Debra Petersen, is a 70 y.o. female, DOB - March 17, 1950, [id number]  Outpatient Primary MD for the patient is Avva, Ravisankar, MD    LOS - 1  Admit date - 12/30/2020    Chief Complaint  Patient presents with   Irregular Heart Beat   Near Syncope       Brief Narrative (HPI from H&P)  70 year old female with a history of hypertension, history of CKD stage V currently on transplant evaluation at [facility], history of lupus nephritis more than 3 decades ago presents to the ER today with a 2-day history of weakness.  She was found to be initially in A. fib RVR thereafter developed bradycardia, EP was consulted and she underwent temporary pacemaker placement.  Will likely require permanent pacemaker as well.   Subjective:    Salvador Aloi today has, No headache, No chest pain, No abdominal pain - No Nausea, No new weakness tingling or numbness, no SOB.   Assessment  & Plan :     Sick Sinus Syndrome - Afib with Natasha Mead - EP following, has Temp pacemaker, defer management of blood pressure and rhythm issues to EP.  On heparin infusion.  2. Acute renal failure superimposed on stage 5 chronic kidney disease, not on chronic dialysis ([place]) Gentle IVF. Nephrology following.  Continue chronic sodium bicarbonate administration.   3. Hypothyroidism - stable TSH, not on replacement.   4. HTN - BP stable off Meds.  5.  Dyslipidemia.  On statin continue at home dose.      Condition - Extremely Guarded  Family Communication  :  friend bedside  Code Status :  Full  Consults  :  EP  PUD Prophylaxis :    Procedures  :     Temp pacemaker placed - 01/02/2021 - Dr Martinique       Disposition Plan  :    Status is: Inpatient  Remains inpatient appropriate because: pacemaker needed   DVT Prophylaxis  :  Hep gtt  SCDs Start: 12/18/2020 2024    Lab Results  Component Value Date  PLT 374 12/22/2020    Diet :  Diet Order             Diet renal with fluid restriction Fluid restriction: 1500 mL Fluid; Room service appropriate? Yes; Fluid consistency: Thin  Diet effective now                    Inpatient Medications  Scheduled Meds:  Chlorhexidine Gluconate Cloth  6 each Topical Daily   pravastatin  20 mg Oral Daily   sodium bicarbonate  1,300 mg Oral TID   Continuous Infusions:  heparin 800 Units/hr (12/29/2020 0900)   PRN Meds:.acetaminophen **OR** acetaminophen, melatonin, ondansetron **OR** ondansetron (ZOFRAN) IV, traMADol  Antibiotics  :    Anti-infectives (From admission, onward)    None        Time Spent in minutes  30   Lala Lund M.D on 12/30/2020 at 10:55 AM  To page go to [email or web address]   [facility] -  Office  [phone]  See all Orders from today for further details    Objective:   Vitals:   12/31/2020 0736 01/04/2021 0800 12/08/2020 0900 12/16/2020 1000  BP:  116/71 100/82 106/74  Pulse: (!) 134     Resp: (!) '7 19 16 18  '$ Temp:      TempSrc:      SpO2: (!) 0% 96% 99% 93%  Weight:      Height:        Wt Readings from Last 3 Encounters:  12/26/2020 56.7 kg  11/27/20 55.3 kg  06/28/20 53.2 kg     Intake/Output Summary (Last 24 hours) at 12/13/2020 1055 Last data filed at 12/10/2020 0900 Gross per 24 hour  Intake 615.58 ml  Output --  Net 615.58 ml     Physical Exam  Awake Alert, No new F.N deficits, Normal affect [place].AT,PERRAL Supple Neck, R IJ - Temp pacemaker in place. Symmetrical Chest wall movement, Good air movement bilaterally, CTAB RRR,No Gallops,Rubs or new Murmurs,  +ve B.Sounds, Abd Soft, No tenderness,   No Cyanosis, Clubbing or edema,        Data Review:    CBC Recent  Labs  Lab 12/29/2020 1626 12/23/2020 0439  WBC 13.7* 11.5*  HGB 10.5* 10.1*  HCT 32.6* 30.7*  PLT 352 374  MCV 108.3* 107.7*  MCH 34.9* 35.4*  MCHC 32.2 32.9  RDW 16.3* 16.4*  LYMPHSABS 4.0 4.4*  MONOABS 1.3* 1.0  EOSABS 0.1 0.2  BASOSABS 0.1 0.1    Recent Labs  Lab 12/17/2020 1626 01/02/2021 0439 12/27/2020 0812  NA 138 137  --   K 4.2 3.9  --   CL 110 112*  --   CO2 16* 14*  --   GLUCOSE 96 90  --   BUN 35* 33*  --   CREATININE 6.23* 5.64*  --   CALCIUM 9.5 9.0  --   AST 19 15  --   ALT 10 8  --   ALKPHOS 120 109  --   BILITOT 0.6 0.4  --   ALBUMIN 3.1* 2.6*  --   MG 1.7 2.4  --   TSH  --   --  4.122    ------------------------------------------------------------------------------------------------------------------ Recent Labs    12/12/2020 2208  CHOL 121  HDL 45  LDLCALC 65  TRIG 54  CHOLHDL 2.7    No results found for: HGBA1C ------------------------------------------------------------------------------------------------------------------ Recent Labs    12/26/2020 0812  TSH 4.122    Cardiac Enzymes No results for input(s): CKMB, TROPONINI,  MYOGLOBIN in the last 168 hours.  Invalid input(s): CK ------------------------------------------------------------------------------------------------------------------ No results found for: BNP   Radiology Reports CARDIAC CATHETERIZATION  Result Date: 12/13/2020 Successful temporary transvenous pacemaker insertion via right IJ Further plans per rounding team.   DG Chest Portable 1 View  Result Date: 12/06/2020 CLINICAL DATA:  Palpitations EXAM: PORTABLE CHEST 1 VIEW COMPARISON:  06/25/2020 FINDINGS: The heart size and mediastinal contours are within normal limits. Both lungs are clear. The visualized skeletal structures are unremarkable. IMPRESSION: No acute abnormality of the lungs in AP portable projection. Electronically Signed   By: Delanna Ahmadi M.D.   On: 12/25/2020 16:44

## 2020-12-20 NOTE — Progress Notes (Addendum)
ANTICOAGULATION CONSULT NOTE   Pharmacy Consult for heparin Indication: atrial fibrillation  Allergies  Allergen Reactions   Codeine Itching    Patient Measurements: Height: '5\' 3"'$  (160 cm) Weight: 56.7 kg (125 lb) IBW/kg (Calculated) : 52.4 Heparin Dosing Weight: TBW  Vital Signs: Temp: 98.2 F (36.8 C) (10/16 1221) Temp Source: Oral (10/16 1221) BP: 98/63 (10/16 1430) Pulse Rate: 130 (10/16 1200)  Labs: Recent Labs    12/07/2020 1626 12/29/2020 1940 12/23/2020 0439 12/29/2020 0812 12/16/2020 1020 12/10/2020 1309  HGB 10.5*  --  10.1*  --   --   --   HCT 32.6*  --  30.7*  --   --   --   PLT 352  --  374  --   --   --   HEPARINUNFRC  --   --   --   --  0.15* <0.10*  CREATININE 6.23*  --  5.64*  --   --   --   TROPONINIHS 1,843* 1,658* 1,457* 1,294*  --   --      Estimated Creatinine Clearance: 7.8 mL/min (A) (by C-G formula based on SCr of 5.64 mg/dL (H)).   Medical History: Past Medical History:  Diagnosis Date   CKD (chronic kidney disease)    Lupus (systemic lupus erythematosus) (HCC)    Wide-complex tachycardia    probable atrial flutter with 1-1 and 2-1 conduction    Assessment: 24 YOF presenting with SOB and lightheadedness, hx of aflutter s/p ablation.  She is not on anticoagulation PTA, chronic anemia stable CHA2DS2-VASc = 3  Heparin turned back on this morning, heparin level was undetectable on check about 3-4 hours after restart. No bleeding issues noted. Will make small rate adjustment then recheck at steady state.   Goal of Therapy:  Heparin level 0.3-0.7 units/ml Monitor platelets by anticoagulation protocol: Yes   Plan:  Increase heparin to 900 units/hr F/u 8 hour heparin level F/u long term AC plan  Erin Hearing PharmD., BCPS Clinical Pharmacist 01/02/2021 4:27 PM

## 2020-12-20 NOTE — ED Notes (Signed)
Assisted pt on bedpan. 

## 2020-12-21 ENCOUNTER — Inpatient Hospital Stay (HOSPITAL_COMMUNITY): Payer: Medicare Other

## 2020-12-21 ENCOUNTER — Encounter (HOSPITAL_COMMUNITY): Payer: Self-pay | Admitting: Cardiology

## 2020-12-21 ENCOUNTER — Inpatient Hospital Stay (HOSPITAL_COMMUNITY): Admission: EM | Disposition: E | Payer: Self-pay | Source: Home / Self Care | Attending: Critical Care Medicine

## 2020-12-21 DIAGNOSIS — I4891 Unspecified atrial fibrillation: Secondary | ICD-10-CM

## 2020-12-21 DIAGNOSIS — R001 Bradycardia, unspecified: Secondary | ICD-10-CM

## 2020-12-21 DIAGNOSIS — I495 Sick sinus syndrome: Secondary | ICD-10-CM

## 2020-12-21 HISTORY — PX: PACEMAKER IMPLANT: EP1218

## 2020-12-21 LAB — ECHOCARDIOGRAM COMPLETE
AR max vel: 2.45 cm2
AV Area VTI: 2.21 cm2
AV Area mean vel: 2.09 cm2
AV Mean grad: 3 mmHg
AV Peak grad: 5.6 mmHg
Ao pk vel: 1.18 m/s
Height: 63 in
S' Lateral: 2.7 cm
Weight: 2045.87 oz

## 2020-12-21 LAB — CBC WITH DIFFERENTIAL/PLATELET
Abs Immature Granulocytes: 0.03 10*3/uL (ref 0.00–0.07)
Basophils Absolute: 0.1 10*3/uL (ref 0.0–0.1)
Basophils Relative: 1 %
Eosinophils Absolute: 0.3 10*3/uL (ref 0.0–0.5)
Eosinophils Relative: 3 %
HCT: 24.6 % — ABNORMAL LOW (ref 36.0–46.0)
Hemoglobin: 8.1 g/dL — ABNORMAL LOW (ref 12.0–15.0)
Immature Granulocytes: 0 %
Lymphocytes Relative: 43 %
Lymphs Abs: 4.3 10*3/uL — ABNORMAL HIGH (ref 0.7–4.0)
MCH: 35.5 pg — ABNORMAL HIGH (ref 26.0–34.0)
MCHC: 32.9 g/dL (ref 30.0–36.0)
MCV: 107.9 fL — ABNORMAL HIGH (ref 80.0–100.0)
Monocytes Absolute: 1 10*3/uL (ref 0.1–1.0)
Monocytes Relative: 9 %
Neutro Abs: 4.5 10*3/uL (ref 1.7–7.7)
Neutrophils Relative %: 44 %
Platelets: 259 10*3/uL (ref 150–400)
RBC: 2.28 MIL/uL — ABNORMAL LOW (ref 3.87–5.11)
RDW: 16.3 % — ABNORMAL HIGH (ref 11.5–15.5)
WBC: 10.2 10*3/uL (ref 4.0–10.5)
nRBC: 0 % (ref 0.0–0.2)

## 2020-12-21 LAB — LIPID PANEL
Cholesterol: 78 mg/dL (ref 0–200)
HDL: 38 mg/dL — ABNORMAL LOW (ref >40)
LDL Cholesterol: 30 mg/dL (ref 0–99)
Total CHOL/HDL Ratio: 2.1 RATIO
Triglycerides: 50 mg/dL (ref <150)
VLDL: 10 mg/dL (ref 0–40)

## 2020-12-21 LAB — FERRITIN: Ferritin: 396 ng/mL — ABNORMAL HIGH (ref 11–307)

## 2020-12-21 LAB — HEMOGLOBIN A1C
Hgb A1c MFr Bld: 4.9 % (ref 4.8–5.6)
Mean Plasma Glucose: 93.93 mg/dL

## 2020-12-21 LAB — COMPREHENSIVE METABOLIC PANEL
ALT: 8 U/L (ref 0–44)
AST: 12 U/L — ABNORMAL LOW (ref 15–41)
Albumin: 2.2 g/dL — ABNORMAL LOW (ref 3.5–5.0)
Alkaline Phosphatase: 91 U/L (ref 38–126)
Anion gap: 6 (ref 5–15)
BUN: 29 mg/dL — ABNORMAL HIGH (ref 8–23)
CO2: 21 mmol/L — ABNORMAL LOW (ref 22–32)
Calcium: 7.9 mg/dL — ABNORMAL LOW (ref 8.9–10.3)
Chloride: 107 mmol/L (ref 98–111)
Creatinine, Ser: 5 mg/dL — ABNORMAL HIGH (ref 0.44–1.00)
GFR, Estimated: 9 mL/min — ABNORMAL LOW (ref >60)
Glucose, Bld: 91 mg/dL (ref 70–99)
Potassium: 3.5 mmol/L (ref 3.5–5.1)
Sodium: 134 mmol/L — ABNORMAL LOW (ref 135–145)
Total Bilirubin: 0.1 mg/dL — ABNORMAL LOW (ref 0.3–1.2)
Total Protein: 4.8 g/dL — ABNORMAL LOW (ref 6.5–8.1)

## 2020-12-21 LAB — IRON AND TIBC
Iron: 39 ug/dL (ref 28–170)
Saturation Ratios: 21 % (ref 10.4–31.8)
TIBC: 186 ug/dL — ABNORMAL LOW (ref 250–450)
UIBC: 147 ug/dL

## 2020-12-21 LAB — MAGNESIUM: Magnesium: 2.1 mg/dL (ref 1.7–2.4)

## 2020-12-21 LAB — SURGICAL PCR SCREEN
MRSA, PCR: NEGATIVE
Staphylococcus aureus: POSITIVE — AB

## 2020-12-21 LAB — BRAIN NATRIURETIC PEPTIDE: B Natriuretic Peptide: 1254.7 pg/mL — ABNORMAL HIGH (ref 0.0–100.0)

## 2020-12-21 LAB — HEPARIN LEVEL (UNFRACTIONATED): Heparin Unfractionated: 0.15 IU/mL — ABNORMAL LOW (ref 0.30–0.70)

## 2020-12-21 SURGERY — PACEMAKER IMPLANT

## 2020-12-21 MED ORDER — FENTANYL CITRATE (PF) 100 MCG/2ML IJ SOLN
INTRAMUSCULAR | Status: DC | PRN
  Administered 2020-12-21: 12.5 ug via INTRAVENOUS

## 2020-12-21 MED ORDER — SODIUM CHLORIDE 0.9 % IV SOLN: INTRAVENOUS | Status: DC

## 2020-12-21 MED ORDER — CEFAZOLIN SODIUM-DEXTROSE 1-4 GM/50ML-% IV SOLN
1.0000 g | Freq: Two times a day (BID) | INTRAVENOUS | Status: AC
  Administered 2020-12-21: 1 g via INTRAVENOUS
  Filled 2020-12-21: qty 50

## 2020-12-21 MED ORDER — CEFAZOLIN SODIUM-DEXTROSE 2-4 GM/100ML-% IV SOLN
INTRAVENOUS | Status: AC
  Filled 2020-12-21: qty 100

## 2020-12-21 MED ORDER — LIDOCAINE HCL (PF) 1 % IJ SOLN
INTRAMUSCULAR | Status: DC | PRN
  Administered 2020-12-21: 45 mL

## 2020-12-21 MED ORDER — MIDAZOLAM HCL 5 MG/5ML IJ SOLN
INTRAMUSCULAR | Status: DC | PRN
  Administered 2020-12-21 (×3): 1 mg via INTRAVENOUS

## 2020-12-21 MED ORDER — HEPARIN (PORCINE) IN NACL 1000-0.9 UT/500ML-% IV SOLN
INTRAVENOUS | Status: DC | PRN
  Administered 2020-12-21: 500 mL

## 2020-12-21 MED ORDER — CEFAZOLIN SODIUM-DEXTROSE 1-4 GM/50ML-% IV SOLN
1.0000 g | Freq: Two times a day (BID) | INTRAVENOUS | Status: DC
  Filled 2020-12-21 (×2): qty 50

## 2020-12-21 MED ORDER — ACETAMINOPHEN 325 MG PO TABS
325.0000 mg | ORAL_TABLET | ORAL | Status: DC | PRN
  Filled 2020-12-21: qty 2

## 2020-12-21 MED ORDER — MIDAZOLAM HCL 5 MG/5ML IJ SOLN
INTRAMUSCULAR | Status: DC | PRN
  Administered 2020-12-21: 1 mg via INTRAVENOUS

## 2020-12-21 MED ORDER — SODIUM CHLORIDE 0.9 % IV SOLN
INTRAVENOUS | Status: AC
  Filled 2020-12-21: qty 2

## 2020-12-21 MED ORDER — OXYCODONE-ACETAMINOPHEN 5-325 MG PO TABS
1.0000 | ORAL_TABLET | Freq: Four times a day (QID) | ORAL | Status: DC
  Administered 2020-12-21 – 2020-12-23 (×5): 1 via ORAL
  Filled 2020-12-21 (×5): qty 1

## 2020-12-21 MED ORDER — SODIUM CHLORIDE 0.9 % IV SOLN
80.0000 mg | INTRAVENOUS | Status: AC
  Administered 2020-12-21: 80 mg
  Filled 2020-12-21: qty 2

## 2020-12-21 MED ORDER — AMIODARONE HCL IN DEXTROSE 360-4.14 MG/200ML-% IV SOLN
60.0000 mg/h | INTRAVENOUS | Status: AC
  Administered 2020-12-21 (×2): 60 mg/h via INTRAVENOUS
  Filled 2020-12-21 (×2): qty 200

## 2020-12-21 MED ORDER — POTASSIUM CHLORIDE CRYS ER 10 MEQ PO TBCR
20.0000 meq | EXTENDED_RELEASE_TABLET | Freq: Once | ORAL | Status: AC
  Administered 2020-12-21: 20 meq via ORAL
  Filled 2020-12-21 (×2): qty 2

## 2020-12-21 MED ORDER — LIDOCAINE HCL 1 % IJ SOLN
INTRAMUSCULAR | Status: AC
  Filled 2020-12-21: qty 60

## 2020-12-21 MED ORDER — FENTANYL CITRATE (PF) 100 MCG/2ML IJ SOLN
INTRAMUSCULAR | Status: AC
  Filled 2020-12-21: qty 2

## 2020-12-21 MED ORDER — AMIODARONE HCL IN DEXTROSE 360-4.14 MG/200ML-% IV SOLN
30.0000 mg/h | INTRAVENOUS | Status: AC
  Administered 2020-12-21 – 2020-12-24 (×7): 30 mg/h via INTRAVENOUS
  Filled 2020-12-21 (×6): qty 200

## 2020-12-21 MED ORDER — CHLORHEXIDINE GLUCONATE CLOTH 2 % EX PADS
6.0000 | MEDICATED_PAD | Freq: Every day | CUTANEOUS | Status: AC
Start: 2020-12-21 — End: 2020-12-25
  Administered 2020-12-21 – 2020-12-25 (×5): 6 via TOPICAL

## 2020-12-21 MED ORDER — ONDANSETRON HCL 4 MG/2ML IJ SOLN: 4.0000 mg | Freq: Four times a day (QID) | INTRAMUSCULAR | Status: DC | PRN

## 2020-12-21 MED ORDER — HEPARIN (PORCINE) IN NACL 1000-0.9 UT/500ML-% IV SOLN
INTRAVENOUS | Status: AC
  Filled 2020-12-21: qty 500

## 2020-12-21 MED ORDER — AMIODARONE LOAD VIA INFUSION
150.0000 mg | Freq: Once | INTRAVENOUS | Status: AC
  Administered 2020-12-21: 150 mg via INTRAVENOUS
  Filled 2020-12-21: qty 83.34

## 2020-12-21 MED ORDER — FENTANYL CITRATE (PF) 100 MCG/2ML IJ SOLN
INTRAMUSCULAR | Status: DC | PRN
  Administered 2020-12-21 (×3): 12.5 ug via INTRAVENOUS

## 2020-12-21 MED ORDER — CHLORHEXIDINE GLUCONATE 4 % EX LIQD
Freq: Once | CUTANEOUS | Status: AC
  Filled 2020-12-21: qty 15

## 2020-12-21 MED ORDER — MUPIROCIN 2 % EX OINT
1.0000 "application " | TOPICAL_OINTMENT | Freq: Two times a day (BID) | CUTANEOUS | Status: AC
  Administered 2020-12-21 – 2020-12-25 (×10): 1 via NASAL
  Filled 2020-12-21 (×2): qty 22

## 2020-12-21 MED ORDER — CEFAZOLIN SODIUM-DEXTROSE 2-4 GM/100ML-% IV SOLN
2.0000 g | INTRAVENOUS | Status: AC
  Administered 2020-12-21: 2 g via INTRAVENOUS

## 2020-12-21 MED ORDER — MIDAZOLAM HCL 5 MG/5ML IJ SOLN
INTRAMUSCULAR | Status: AC
  Filled 2020-12-21: qty 5

## 2020-12-21 SURGICAL SUPPLY — 8 items
CABLE SURGICAL S-101-97-12 (CABLE) ×2 IMPLANT
KIT MICROPUNCTURE NIT STIFF (SHEATH) ×1 IMPLANT
LEAD TENDRIL MRI 46CM LPA1200M (Lead) ×1 IMPLANT
LEAD TENDRIL MRI 52CM LPA1200M (Lead) ×1 IMPLANT
PACEMAKER ASSURITY DR-RF (Pacemaker) ×1 IMPLANT
PAD PRO RADIOLUCENT 2001M-C (PAD) ×2 IMPLANT
SHEATH 8FR PRELUDE SNAP 13 (SHEATH) ×2 IMPLANT
TRAY PACEMAKER INSERTION (PACKS) ×2 IMPLANT

## 2020-12-21 NOTE — Discharge Instructions (Signed)
After Your Pacemaker   You have a [employer] Pacemaker  ACTIVITY Do not lift your arm above shoulder height for 1 week after your procedure. After 7 days, you may progress as below.  You should remove your sling 24 hours after your procedure, unless otherwise instructed by your provider.     Monday December 28, 2020  Tuesday December 29, 2020 Wednesday December 30, 2020 Thursday December 31, 2020   Do not lift, push, pull, or carry anything over 10 pounds with the affected arm until 6 weeks (Monday February 01, 2021 ) after your procedure.   You may drive AFTER your wound check, unless you have been told otherwise by your provider.   Ask your healthcare provider when you can go back to work   INCISION/Dressing If you are on a blood thinner such as Coumadin, Xarelto, Eliquis, Plavix, or Pradaxa please confirm with your provider when this should be resumed.   If large square, outer bandage is left in place, this can be removed after 24 hours from your procedure. Do not remove steri-strips or glue as below.   Monitor your Pacemaker site for redness, swelling, and drainage. Call the device clinic at [phone] if you experience these symptoms or fever/chills.  If your incision is sealed with Steri-strips or staples, you may shower 10 days after your procedure or when told by your provider. Do not remove the steri-strips or let the shower hit directly on your site. You may wash around your site with soap and water.    If you were discharged in a sling, please do not wear this during the day more than 48 hours after your surgery unless otherwise instructed. This may increase the risk of stiffness and soreness in your shoulder.   Avoid lotions, ointments, or perfumes over your incision until it is well-healed.  You may use a hot tub or a pool AFTER your wound check appointment if the incision is completely closed.  PAcemaker Alerts:  Some alerts are vibratory and others beep. These are NOT  emergencies. Please call our office to let us know. If this occurs at night or on weekends, it can wait until the next business day. Send a remote transmission.  If your device is capable of reading fluid status (for heart failure), you will be offered monthly monitoring to review this with you.   DEVICE MANAGEMENT Remote monitoring is used to monitor your pacemaker from home. This monitoring is scheduled every 91 days by our office. It allows [place] to keep an eye on the functioning of your device to ensure it is working properly. You will routinely see your Electrophysiologist annually (more often if necessary).   You should receive your ID card for your new device in 4-8 weeks. Keep this card with you at all times once received. Consider wearing a medical alert bracelet or necklace.  Your Pacemaker may be MRI compatible. This will be discussed at your next office visit/wound check.  You should avoid contact with strong electric or magnetic fields.   Do not use amateur (ham) radio equipment or electric (arc) welding torches. MP3 player headphones with magnets should not be used. Some devices are safe to use if held at least 12 inches (30 cm) from your Pacemaker. These include power tools, lawn mowers, and speakers. If you are unsure if something is safe to use, ask your health care provider.  When using your cell phone, hold it to the ear that is on the opposite side from  the Pacemaker. Do not leave your cell phone in a pocket over the Pacemaker.  You may safely use electric blankets, heating pads, computers, and microwave ovens.  Call the office right away if: You have chest pain. You feel more short of breath than you have felt before. You feel more light-headed than you have felt before. Your incision starts to open up.  This information is not intended to replace advice given to you by your health care provider. Make sure you discuss any questions you have with your health care provider.

## 2020-12-21 NOTE — Plan of Care (Signed)

## 2020-12-21 NOTE — Progress Notes (Signed)
PROGRESS NOTE                                                                                                                                                                                                             Patient Demographics:    Debra Petersen, is a 70 y.o. female, DOB - April 24, 1950, [id number]  Outpatient Primary MD for the patient is Avva, Ravisankar, MD    LOS - 2  Admit date - 12/29/2020    Chief Complaint  Patient presents with   Irregular Heart Beat   Near Syncope       Brief Narrative (HPI from H&P)  70 year old female with a history of hypertension, history of CKD stage V currently on transplant evaluation at [facility], history of lupus nephritis more than 3 decades ago presents to the ER today with a 2-day history of weakness.  She was found to be initially in A. fib RVR thereafter developed bradycardia, EP was consulted and she underwent temporary pacemaker placement.  Will likely require permanent pacemaker as well.   Subjective:   Patient in bed, appears comfortable, denies any headache, no fever, no chest pain or pressure, no shortness of breath , no abdominal pain. No new focal weakness.    Assessment  & Plan :     Sick Sinus Syndrome - Afib with Natasha Mead - EP following, has Temp pacemaker, defer management of blood pressure and rhythm issues to EP.  Due to PPM on 12/24/2020, likely add rate control agents after that, anticoagulation per Cards.  2. Acute renal failure superimposed on stage 5 chronic kidney disease, not on chronic dialysis ([place]) Gentle IVF. Nephrology following.  Continue chronic sodium bicarbonate administration.   3. Hypothyroidism - stable TSH, not on replacement.   4. HTN - BP stable off Meds.  5.  Dyslipidemia.  On statin continue at home dose.      Condition - Extremely Guarded  Family Communication  :  friend bedside  Code Status :  Full  Consults  :   EP  PUD Prophylaxis :    Procedures  :     Temp pacemaker placed - 12/25/2020 - Dr Martinique      Disposition Plan  :    Status is: Inpatient  Remains inpatient appropriate because: pacemaker needed   DVT Prophylaxis  :  SCDs Start: 12/12/2020 2024    Lab Results  Component Value Date   PLT 259 12/18/2020    Diet :  Diet Order             Diet NPO time specified  Diet effective midnight                    Inpatient Medications  Scheduled Meds:  Chlorhexidine Gluconate Cloth  6 each Topical Q0600   gentamicin irrigation  80 mg Irrigation On Call   mupirocin ointment  1 application Nasal BID   pravastatin  20 mg Oral Daily   sodium bicarbonate  1,300 mg Oral TID   sodium chloride flush  10-40 mL Intracatheter Q12H   Continuous Infusions:  sodium chloride      ceFAZolin (ANCEF) IV     diltiazem (CARDIZEM) infusion 15 mg/hr (12/31/2020 0840)   PRN Meds:.acetaminophen **OR** acetaminophen, melatonin, ondansetron **OR** ondansetron (ZOFRAN) IV, sodium chloride flush, traMADol, zolpidem  Antibiotics  :    Anti-infectives (From admission, onward)    Start     Dose/Rate Route Frequency Ordered Stop   12/29/2020 1400  gentamicin (GARAMYCIN) 80 mg in sodium chloride 0.9 % 500 mL irrigation        80 mg Irrigation On call 12/14/2020 0724 12/22/20 1400   12/29/2020 1400  ceFAZolin (ANCEF) IVPB 2g/100 mL premix        2 g 200 mL/hr over 30 Minutes Intravenous On call 12/29/2020 0724 12/22/20 1400        Time Spent in minutes  30   Lala Lund M.D on 12/18/2020 at 8:48 AM  To page go to [email or web address]   [facility] -  Office  [phone]  See all Orders from today for further details    Objective:   Vitals:   12/11/2020 0600 12/06/2020 0700 12/26/2020 0800 12/24/2020 0818  BP: 120/88 130/71 128/72   Pulse:      Resp: '20 18 19   '$ Temp:    98.1 F (36.7 C)  TempSrc:    Oral  SpO2: 95% 93% 91%   Weight:      Height:        Wt Readings from Last 3  Encounters:  12/30/2020 58 kg  11/27/20 55.3 kg  06/28/20 53.2 kg     Intake/Output Summary (Last 24 hours) at 12/31/2020 0848 Last data filed at 12/29/2020 0800 Gross per 24 hour  Intake 3273.55 ml  Output --  Net 3273.55 ml     Physical Exam  Awake Alert, No new F.N deficits, Normal affect [place].AT,PERRAL Supple Neck, R IJ - Temp pacemaker in place. Symmetrical Chest wall movement, Good air movement bilaterally, CTAB iRRR,No Gallops, Rubs or new Murmurs,  +ve B.Sounds, Abd Soft, No tenderness,   No Cyanosis, Clubbing or edema,         Data Review:    CBC Recent Labs  Lab 12/16/2020 1626 12/05/2020 0439 12/16/2020 0130  WBC 13.7* 11.5* 10.2  HGB 10.5* 10.1* 8.1*  HCT 32.6* 30.7* 24.6*  PLT 352 374 259  MCV 108.3* 107.7* 107.9*  MCH 34.9* 35.4* 35.5*  MCHC 32.2 32.9 32.9  RDW 16.3* 16.4* 16.3*  LYMPHSABS 4.0 4.4* 4.3*  MONOABS 1.3* 1.0 1.0  EOSABS 0.1 0.2 0.3  BASOSABS 0.1 0.1 0.1    Recent Labs  Lab 12/16/2020 1626 12/14/2020 0439 12/16/2020 0812 12/25/2020 0130  NA 138 137  --  134*  K 4.2 3.9  --  3.5  CL 110 112*  --  107  CO2 16* 14*  --  21*  GLUCOSE 96 90  --  91  BUN 35* 33*  --  29*  CREATININE 6.23* 5.64*  --  5.00*  CALCIUM 9.5 9.0  --  7.9*  AST 19 15  --  12*  ALT 10 8  --  8  ALKPHOS 120 109  --  91  BILITOT 0.6 0.4  --  0.1*  ALBUMIN 3.1* 2.6*  --  2.2*  MG 1.7 2.4  --  2.1  TSH  --   --  4.122  --   HGBA1C  --   --   --  4.9  BNP  --   --   --  1,254.7*    ------------------------------------------------------------------------------------------------------------------ Recent Labs    12/17/2020 2208 12/15/2020 0130  CHOL 121 78  HDL 45 38*  LDLCALC 65 30  TRIG 54 50  CHOLHDL 2.7 2.1    Lab Results  Component Value Date   HGBA1C 4.9 12/18/2020   ------------------------------------------------------------------------------------------------------------------ Recent Labs    12/27/2020 0812  TSH 4.122    Cardiac Enzymes No  results for input(s): CKMB, TROPONINI, MYOGLOBIN in the last 168 hours.  Invalid input(s): CK ------------------------------------------------------------------------------------------------------------------    Component Value Date/Time   BNP 1,254.7 (H) 12/29/2020 0130     Radiology Reports CARDIAC CATHETERIZATION  Result Date: 12/08/2020 Successful temporary transvenous pacemaker insertion via right IJ Further plans per rounding team.   DG Chest Portable 1 View  Result Date: 12/24/2020 CLINICAL DATA:  Palpitations EXAM: PORTABLE CHEST 1 VIEW COMPARISON:  06/25/2020 FINDINGS: The heart size and mediastinal contours are within normal limits. Both lungs are clear. The visualized skeletal structures are unremarkable. IMPRESSION: No acute abnormality of the lungs in AP portable projection. Electronically Signed   By: Delanna Ahmadi M.D.   On: 12/10/2020 16:44

## 2020-12-21 NOTE — Progress Notes (Addendum)
Electrophysiology Rounding Note  Patient Name: Charnessa Metzner Date of Encounter: 12/25/2020  Primary Cardiologist: None Electrophysiologist: Dr. Caryl Comes   Subjective   The patient is doing well today. Stable on temp pacer. Back in rapid AF this am. At this time, the patient denies chest pain, shortness of breath, or any new concerns.  Inpatient Medications    Scheduled Meds:  Chlorhexidine Gluconate Cloth  6 each Topical Q0600   mupirocin ointment  1 application Nasal BID   pravastatin  20 mg Oral Daily   sodium bicarbonate  1,300 mg Oral TID   sodium chloride flush  10-40 mL Intracatheter Q12H   Continuous Infusions:  diltiazem (CARDIZEM) infusion 10 mg/hr (12/29/2020 0603)    sodium bicarbonate (isotonic) infusion in sterile water 75 mL/hr at 12/18/2020 0603   PRN Meds: acetaminophen **OR** acetaminophen, melatonin, ondansetron **OR** ondansetron (ZOFRAN) IV, sodium chloride flush, traMADol, zolpidem   Vital Signs    Vitals:   12/09/2020 0500 12/12/2020 0522 12/30/2020 0530 01/03/2021 0600  BP: (!) 106/53 97/88 112/71 120/88  Pulse:  (!) 109    Resp: '17 15 13 20  '$ Temp:      TempSrc:      SpO2: 95% 93% 91% 95%  Weight:  58 kg    Height:        Intake/Output Summary (Last 24 hours) at 01/03/2021 0659 Last data filed at 12/10/2020 0603 Gross per 24 hour  Intake 3092.38 ml  Output --  Net 3092.38 ml   Filed Weights   12/07/2020 1558 12/17/2020 0522  Weight: 56.7 kg 58 kg    Physical Exam    GEN- The patient is well appearing, alert and oriented x 3 today.   Head- normocephalic, atraumatic Eyes-  Sclera clear, conjunctiva pink Ears- hearing intact Oropharynx- clear Neck- supple. R neck temp wire Lungs- Clear to ausculation bilaterally, normal work of breathing Heart- Tachy Irregularly irregular rate and rhythm, no murmurs, rubs or gallops GI- soft, NT, ND, + BS Extremities- no clubbing or cyanosis. No edema Skin- no rash or lesion Psych- euthymic mood, full  affect Neuro- strength and sensation are intact  Labs    CBC Recent Labs    12/29/2020 0439 12/31/2020 0130  WBC 11.5* 10.2  NEUTROABS 5.8 4.5  HGB 10.1* 8.1*  HCT 30.7* 24.6*  MCV 107.7* 107.9*  PLT 374 [id number]   Basic Metabolic Panel Recent Labs    12/30/2020 0439 12/09/2020 0130  NA 137 134*  K 3.9 3.5  CL 112* 107  CO2 14* 21*  GLUCOSE 90 91  BUN 33* 29*  CREATININE 5.64* 5.00*  CALCIUM 9.0 7.9*  MG 2.4 2.1   Liver Function Tests Recent Labs    12/08/2020 0439 12/10/2020 0130  AST 15 12*  ALT 8 8  ALKPHOS 109 91  BILITOT 0.4 0.1*  PROT 5.9* 4.8*  ALBUMIN 2.6* 2.2*   No results for input(s): LIPASE, AMYLASE in the last 72 hours. Cardiac Enzymes No results for input(s): CKTOTAL, CKMB, CKMBINDEX, TROPONINI in the last 72 hours.   Telemetry    Sinus overnight, around 0500 this am started going back in and out of rapid AF in 120-130s (personally reviewed)  Radiology    CARDIAC CATHETERIZATION  Result Date: 01/04/2021 Successful temporary transvenous pacemaker insertion via right IJ Further plans per rounding team.   DG Chest Portable 1 View  Result Date: 12/30/2020 CLINICAL DATA:  Palpitations EXAM: PORTABLE CHEST 1 VIEW COMPARISON:  06/25/2020 FINDINGS: The heart size and mediastinal contours are within  normal limits. Both lungs are clear. The visualized skeletal structures are unremarkable. IMPRESSION: No acute abnormality of the lungs in AP portable projection. Electronically Signed   By: Delanna Ahmadi M.D.   On: 12/15/2020 16:44    Patient Profile     Nabria Walas is a 70 y.o. female with a past medical history significant for atrial tachycardia, HTN, syncope, CKD IV, atrial flutter s/p ablation, and paroxysmal atrial fibrillation.  she was admitted for weakness and palpitations with episodes of near syncope. Found to be in rapid AF on arrival to ED. She was started on diltiazem and noted to have significant pauses with near syncope. Urgent temporary pacer  implanted 12/06/2020. EP asked to see for PPM consideration   Assessment & Plan    Tachy-brady syndrome 2.  Atrial fibrillation with RVR Initially tried rate control with diltiazem which led to significant pauses and near syncope Temp wire placed 12/09/2020 Pt will require rate and or rhythm control, and is indicated for pacing.  Echo pending.  QRS narrow at baseline and will potentially not require frequent pacing Explained risks, benefits, and alternatives to PPM implantation, including but not limited to bleeding, infection, pneumothorax, pericardial effusion, lead dislodgement, heart attack, stroke, or death.  Pt verbalized understanding and agrees to proceed.  STOP heparin this am for procedure. Will need [place] after for rhythm control and CHA2DS2/VASc of at least 3.  3. CKD V Her baseline creatinine is in the 4-5 range.   4. Elevated Troponin  HS-Trop 1843 -> 1658 -> 1457 -> 1294. Thought to be in setting of demand ischemia with CKD V Myoview 2021 without ischemia  Plan for pacing with Dr. Lovena Le today.   For questions or updates, please contact [place] Please consult [email or web address] for contact info under Cardiology/STEMI.  Signed, Shirley Friar, PA-C  12/08/2020, 6:59 AM   EP Attending  Patient seen and examined. Discussed PPM insertion in detail. Her renal function is improved and approaching her baseline. She will undergo DDD PM insertion for symptomatic tach-brady with 10 second pauses.  Carleene Overlie Nikala Walsworth,MD

## 2020-12-21 NOTE — Progress Notes (Signed)
PT Cancellation Note  Patient Details Name: Debra Petersen MRN: [id number] DOB: Jun 05, 1950   Cancelled Treatment:    Reason Eval/Treat Not Completed: Medical issues which prohibited therapy. Pt had pacer placement.   [place] 12/07/2020, 4:03 PM [place] Pager [phone] Office [phone]

## 2020-12-21 NOTE — Progress Notes (Signed)
Admit: 01/03/2021 LOS: 2  55F CKD5 (CKA Debra Petersen), hx/o SLE and LN, admit with AF+RVR --> symptomatic bradycardia s/p temp PM.   Subjective:  For permanent PM this AM NO c/o this AM; feels well; no uremia SCr further improved to 5.0, K 3.5, HCO3 now 21 Hb 10.1 to 8.1 no overt bleeding, not oozing from IJ line; TSAT 21% and Ferritin 396 On RA, UOP not measured  10/16 0701 - 10/17 0700 In: 3092.4 [P.O.:960; I.V.:2132.4] Out: -   Filed Weights   12/18/2020 1558 12/23/2020 0522  Weight: 56.7 kg 58 kg    Scheduled Meds:  chlorhexidine   Topical Once   Chlorhexidine Gluconate Cloth  6 each Topical Q0600   gentamicin irrigation  80 mg Irrigation On Call   mupirocin ointment  1 application Nasal BID   pravastatin  20 mg Oral Daily   sodium bicarbonate  1,300 mg Oral TID   sodium chloride flush  10-40 mL Intracatheter Q12H   Continuous Infusions:  sodium chloride      ceFAZolin (ANCEF) IV     diltiazem (CARDIZEM) infusion 10 mg/hr (12/08/2020 0603)    sodium bicarbonate (isotonic) infusion in sterile water 75 mL/hr at 12/29/2020 0603   PRN Meds:.acetaminophen **OR** acetaminophen, melatonin, ondansetron **OR** ondansetron (ZOFRAN) IV, sodium chloride flush, traMADol, zolpidem  Current Labs: reviewed    Physical Exam:  Blood pressure 120/88, pulse (!) 109, temperature 97.8 F (36.6 C), temperature source Oral, resp. rate 20, height '5\' 3"'$  (1.6 m), weight 58 kg, SpO2 95 %. NAD, wa/wd, conversant Tachy, irriegular R IJ Cordis and PM present NO rub CTAB No LEE Nonfocal  A AoCKD5, [name] follows. [facility] KT eval in process.  Plan for PD as RRT.  Stable GFR, K and HCO3 stable/improved. No uremia.   AF + RVR and symptomatic bradycardia, for permanent PM today Metabolic acidosis, improved on PO and IV HCO3 Anemia of CKD on ESA as outpt, TSAT ok.  Hb down from admission, no overt losses HTN, normal, no BP meds except dilt  P Stop IV HCO3, cont PO Trend HB at this time No RRT  needed Daily weights, Daily Renal Panel, Strict I/Os, Avoid nephrotoxins (NSAIDs, judicious IV Contrast)  Medication Issues; Preferred narcotic agents for pain control are hydromorphone, fentanyl, and methadone. Morphine should not be used.  Baclofen should be avoided Avoid oral sodium phosphate and magnesium citrate based laxatives / bowel preps    [name] MD 12/22/2020, 8:13 AM  Recent Labs  Lab 12/13/2020 1626 12/26/2020 0439 12/06/2020 0130  NA 138 137 134*  K 4.2 3.9 3.5  CL 110 112* 107  CO2 16* 14* 21*  GLUCOSE 96 90 91  BUN 35* 33* 29*  CREATININE 6.23* 5.64* 5.00*  CALCIUM 9.5 9.0 7.9*   Recent Labs  Lab 12/23/2020 1626 12/13/2020 0439 12/10/2020 0130  WBC 13.7* 11.5* 10.2  NEUTROABS 8.1* 5.8 4.5  HGB 10.5* 10.1* 8.1*  HCT 32.6* 30.7* 24.6*  MCV 108.3* 107.7* 107.9*  PLT 352 374 259

## 2020-12-21 NOTE — Progress Notes (Signed)
ANTICOAGULATION CONSULT NOTE - Follow Up Consult  Pharmacy Consult for heparin Indication: atrial fibrillation  Labs: Recent Labs    12/24/2020 1626 12/18/2020 1940 12/17/2020 0439 12/07/2020 0812 12/16/2020 1020 12/14/2020 1309 12/08/2020 0130  HGB 10.5*  --  10.1*  --   --   --  8.1*  HCT 32.6*  --  30.7*  --   --   --  24.6*  PLT 352  --  374  --   --   --  259  HEPARINUNFRC  --   --   --   --  0.15* <0.10* 0.15*  CREATININE 6.23*  --  5.64*  --   --   --  5.00*  TROPONINIHS 1,843* 1,658* 1,457* 1,294*  --   --   --     Assessment: 70yo female subtherapeutic on heparin after rate change; no infusion issues or signs of bleeding per RN.  Goal of Therapy:  Heparin level 0.3-0.7 units/ml   Plan:  Will increase heparin infusion by 3-4 units/kg/hr to 1100 units/hr and check level in 8 hours.    Wynona Neat, PharmD, BCPS  12/05/2020,3:08 AM

## 2020-12-21 NOTE — Progress Notes (Signed)
  Echocardiogram 2D Echocardiogram has been performed.  Debra Petersen F 12/24/2020, 9:04 AM

## 2020-12-21 NOTE — Progress Notes (Signed)
EP Attending  During the patient's PM procedure she had pleuritic chest pain and both of her leads were repositioned even though working normally. She was initially without pain and then she developed more pain reproducible to palpation. Her leads look to be working normally and her CXR looks good. She is comfortable and we will give more pain meds and see how she looks tomorrow.   Carleene Overlie Graylee Arutyunyan,MD

## 2020-12-22 ENCOUNTER — Inpatient Hospital Stay (HOSPITAL_COMMUNITY): Payer: Medicare Other

## 2020-12-22 LAB — CBC WITH DIFFERENTIAL/PLATELET
Abs Immature Granulocytes: 0.04 10*3/uL (ref 0.00–0.07)
Basophils Absolute: 0.1 10*3/uL (ref 0.0–0.1)
Basophils Relative: 1 %
Eosinophils Absolute: 0.2 10*3/uL (ref 0.0–0.5)
Eosinophils Relative: 2 %
HCT: 26.5 % — ABNORMAL LOW (ref 36.0–46.0)
Hemoglobin: 8.5 g/dL — ABNORMAL LOW (ref 12.0–15.0)
Immature Granulocytes: 0 %
Lymphocytes Relative: 32 %
Lymphs Abs: 3.6 10*3/uL (ref 0.7–4.0)
MCH: 34.6 pg — ABNORMAL HIGH (ref 26.0–34.0)
MCHC: 32.1 g/dL (ref 30.0–36.0)
MCV: 107.7 fL — ABNORMAL HIGH (ref 80.0–100.0)
Monocytes Absolute: 0.9 10*3/uL (ref 0.1–1.0)
Monocytes Relative: 8 %
Neutro Abs: 6.4 10*3/uL (ref 1.7–7.7)
Neutrophils Relative %: 57 %
Platelets: 250 10*3/uL (ref 150–400)
RBC: 2.46 MIL/uL — ABNORMAL LOW (ref 3.87–5.11)
RDW: 16.1 % — ABNORMAL HIGH (ref 11.5–15.5)
WBC: 11.2 10*3/uL — ABNORMAL HIGH (ref 4.0–10.5)
nRBC: 0.2 % (ref 0.0–0.2)

## 2020-12-22 LAB — COMPREHENSIVE METABOLIC PANEL
ALT: 9 U/L (ref 0–44)
AST: 20 U/L (ref 15–41)
Albumin: 2.3 g/dL — ABNORMAL LOW (ref 3.5–5.0)
Alkaline Phosphatase: 111 U/L (ref 38–126)
Anion gap: 9 (ref 5–15)
BUN: 31 mg/dL — ABNORMAL HIGH (ref 8–23)
CO2: 24 mmol/L (ref 22–32)
Calcium: 8.5 mg/dL — ABNORMAL LOW (ref 8.9–10.3)
Chloride: 103 mmol/L (ref 98–111)
Creatinine, Ser: 5.4 mg/dL — ABNORMAL HIGH (ref 0.44–1.00)
GFR, Estimated: 8 mL/min — ABNORMAL LOW (ref >60)
Glucose, Bld: 151 mg/dL — ABNORMAL HIGH (ref 70–99)
Potassium: 4.2 mmol/L (ref 3.5–5.1)
Sodium: 136 mmol/L (ref 135–145)
Total Bilirubin: 0.2 mg/dL — ABNORMAL LOW (ref 0.3–1.2)
Total Protein: 5.1 g/dL — ABNORMAL LOW (ref 6.5–8.1)

## 2020-12-22 LAB — MAGNESIUM: Magnesium: 1.9 mg/dL (ref 1.7–2.4)

## 2020-12-22 LAB — BRAIN NATRIURETIC PEPTIDE: B Natriuretic Peptide: 1029.9 pg/mL — ABNORMAL HIGH (ref 0.0–100.0)

## 2020-12-22 MED ORDER — DILTIAZEM HCL 60 MG PO TABS
60.0000 mg | ORAL_TABLET | Freq: Three times a day (TID) | ORAL | Status: DC
  Administered 2020-12-22 (×2): 60 mg via ORAL
  Filled 2020-12-22 (×2): qty 1

## 2020-12-22 MED ORDER — DILTIAZEM HCL 60 MG PO TABS
90.0000 mg | ORAL_TABLET | Freq: Three times a day (TID) | ORAL | Status: DC
  Administered 2020-12-22: 90 mg via ORAL
  Filled 2020-12-22: qty 2

## 2020-12-22 MED ORDER — AMIODARONE IV BOLUS ONLY 150 MG/100ML: 150.0000 mg | Freq: Once | INTRAVENOUS | Status: DC

## 2020-12-22 MED ORDER — DILTIAZEM HCL 60 MG PO TABS
30.0000 mg | ORAL_TABLET | Freq: Once | ORAL | Status: AC
  Administered 2020-12-22: 30 mg via ORAL
  Filled 2020-12-22: qty 1

## 2020-12-22 MED ORDER — AMIODARONE LOAD VIA INFUSION
150.0000 mg | Freq: Once | INTRAVENOUS | Status: AC
  Administered 2020-12-22: 150 mg via INTRAVENOUS
  Filled 2020-12-22: qty 83.34

## 2020-12-22 MED FILL — Lidocaine HCl Local Inj 1%: INTRAMUSCULAR | Qty: 45 | Status: AC

## 2020-12-22 NOTE — Progress Notes (Addendum)
Pt is in Afib RVR with HR 125-145. Pt has been a-paced with HR in 60's-70's for most of the shift. Pt is asymptomatic and denies chest pain or discomfort. Amiodarone gtt @ 30 mg/hr infusing. Cardiology paged. Now at bedside. See orders. Will implement and notify provider if RVR persists.    12/22/20 0245  Vitals  BP 105/70  MAP (mmHg) 82  ECG Heart Rate (!) 125  Resp 11  Oxygen Therapy  SpO2 94 %  MEWS Score  MEWS Temp 0  MEWS Systolic 0  MEWS Pulse 2  MEWS RR 1  MEWS LOC 0  MEWS Score 3  MEWS Score Color Yellow

## 2020-12-22 NOTE — Evaluation (Signed)
Physical Therapy Evaluation Patient Details Name: Debra Petersen MRN: [id number] DOB: [birth year] Today's Date: 12/22/2020  History of Present Illness  Pt is a 70 y.o. female admitted 12/08/2020 with weakness, palpitations, episodes of near syncope. Found to be in rapid AF, significant pauses. Urgent temporary pacer placed 10/15. Now s/p [employer] PPM placement on 10/17. PMH includes HTN, CKD IV, aflutter, PAF, atrial tachycardia.   Clinical Impression  Pt presents with an overall decrease in functional mobility secondary to above. PTA, pt independent, lives alone, active; has multiple supportive friends who can assist at d/c if needed. Educ on pacemaker precautions, positioning, and importance of mobility. Today, pt able to initiate ADL tasks, transfer and gait training with min guard; activity limited by c/o dizziness and noted tachycardia (RN aware). Pt would benefit from continued acute PT services to maximize functional mobility and independence prior to d/c home.   HR up to 155 with activity SpO2 down to 85% on RA with activity (unreliable pleth); 94% on 3L resting       Recommendations for follow up therapy are one component of a multi-disciplinary discharge planning process, led by the attending physician.  Recommendations may be updated based on patient status, additional functional criteria and insurance authorization.  Follow Up Recommendations No PT follow up;Supervision for mobility/OOB    Equipment Recommendations  None recommended by PT    Recommendations for Other Services       Precautions / Restrictions Precautions Precautions: Fall;ICD/Pacemaker Restrictions Weight Bearing Restrictions: Yes LUE Weight Bearing: Non weight bearing      Mobility  Bed Mobility Overal bed mobility: Modified Independent             General bed mobility comments: HOB elevated    Transfers Overall transfer level: Needs assistance Equipment used: None Transfers: Sit to/from  Stand Sit to Stand: Min guard         General transfer comment: Initial stand requiring return to sitting secondary to lightheadedness; able to stand again from EOB and low toilet height with close min guard  Ambulation/Gait Ambulation/Gait assistance: Min guard;Min assist Gait Distance (Feet): 20 Feet Assistive device: None;1 person hand held assist Gait Pattern/deviations: Step-through pattern;Decreased stride length;Drifts right/left Gait velocity: Decreased   General Gait Details: Slow, mildly unsteady gait with close min guard for balance due to pt's instability and c/o feeling dizzy; 1x minA for HHA to maintain balance; deferred further distance secondary to tachycardia and feeling lightheaded (RN aware)  Stairs            Wheelchair Mobility    Modified Rankin (Stroke Patients Only)       Balance Overall balance assessment: Needs assistance   Sitting balance-Leahy Scale: Good Sitting balance - Comments: able to don bilateral socks sitting EOB; indep with pericare sitting on toilet     Standing balance-Leahy Scale: Fair                               Pertinent Vitals/Pain Pain Assessment: Faces Faces Pain Scale: Hurts little more Pain Location: L-side chest/breast Pain Descriptors / Indicators: Sore;Guarding Pain Intervention(s): Monitored during session;Patient requesting pain meds-RN notified    Home Living Family/patient expects to be discharged to:: Private residence Living Arrangements: Alone Available Help at Discharge: Friend(s);Available 24 hours/day Type of Home: House Home Access: Stairs to enter Entrance Stairs-Rails: Right Entrance Stairs-Number of Steps: 3 Home Layout: One level Home Equipment: None Additional Comments: Lives alone, multiple supportive friends  available to assist as much as needed; discussed having initial 24/7 support due to fall risk, which pt is in agreement with    Prior Function Level of Independence:  Independent         Comments: Independent without DME. Drives. Is retired from previous job, but still enjoys working at [employer] around holiday season     Journalist, newspaper        Extremity/Trunk Assessment   Upper Extremity Assessment Upper Extremity Assessment: Overall WFL for tasks assessed;LUE deficits/detail LUE Deficits / Details: Limited secondary to pacemaker precautions, still apparent good functional strength and motion within confines of precautions    Lower Extremity Assessment Lower Extremity Assessment: Overall WFL for tasks assessed       Communication   Communication: No difficulties  Cognition Arousal/Alertness: Awake/alert Behavior During Therapy: WFL for tasks assessed/performed Overall Cognitive Status: Within Functional Limits for tasks assessed                                        General Comments General comments (skin integrity, edema, etc.): HR 60s at rest, up to 155 with activity; SBP 107; SpO2 down to 85% on RA (unreliable pleth), 94% on 3L resting end of session. LUE sling removed at beginning of session, educ on pacemaker precautions. Pt from home alone; educ on recommendation to have initial support from friends for 1-2 days upon return home    Exercises     Assessment/Plan    PT Assessment Patient needs continued PT services  PT Problem List Decreased activity tolerance;Decreased balance;Decreased mobility;Decreased knowledge of precautions;Cardiopulmonary status limiting activity;Pain       PT Treatment Interventions DME instruction;Gait training;Stair training;Functional mobility training;Therapeutic activities;Therapeutic exercise;Balance training;Patient/family education    PT Goals (Current goals can be found in the Care Plan section)  Acute Rehab PT Goals Patient Stated Goal: home tomorrow, return to work PT Goal Formulation: With patient Time For Goal Achievement: 01/05/21 Potential to Achieve Goals:  Good    Frequency Min 3X/week   Barriers to discharge        Co-evaluation               AM-PAC PT "6 Clicks" Mobility  Outcome Measure Help needed turning from your back to your side while in a flat bed without using bedrails?: None Help needed moving from lying on your back to sitting on the side of a flat bed without using bedrails?: A Little Help needed moving to and from a bed to a chair (including a wheelchair)?: A Little Help needed standing up from a chair using your arms (e.g., wheelchair or bedside chair)?: A Little Help needed to walk in hospital room?: A Little Help needed climbing 3-5 steps with a railing? : A Little 6 Click Score: 19    End of Session   Activity Tolerance: Patient tolerated treatment well Patient left: in chair;with call bell/phone within reach;with nursing/sitter in room Nurse Communication: Mobility status PT Visit Diagnosis: Other abnormalities of gait and mobility (R26.89)    Time: [id number] PT Time Calculation (min) (ACUTE ONLY): 24 min   Charges:   PT Evaluation $PT Eval Moderate Complexity: 1 Mod PT Treatments $Therapeutic Activity: 8-22 mins      Mabeline Caras, PT, DPT Acute Rehabilitation Services  Pager [phone] Office [place] 12/22/2020, 11:28 AM

## 2020-12-22 NOTE — Progress Notes (Signed)
PROGRESS NOTE                                                                                                                                                                                                             Patient Demographics:    Debra Petersen, is a 70 y.o. female, DOB - Apr 02, 1950, [id number]  Outpatient Primary MD for the patient is Avva, Ravisankar, MD    LOS - 3  Admit date - 12/23/2020    Chief Complaint  Patient presents with   Irregular Heart Beat   Near Syncope       Brief Narrative (HPI from H&P)  70 year old female with a history of hypertension, history of CKD stage V currently on transplant evaluation at [facility], history of lupus nephritis more than 3 decades ago presents to the ER today with a 2-day history of weakness.  She was found to be initially in A. fib RVR thereafter developed bradycardia, EP was consulted and she underwent temporary pacemaker placement followed by permanent pacemaker on 12/16/2020, now in RVR.   Subjective:   Patient in bed, appears comfortable, denies any headache, no fever, no chest pain or pressure, no shortness of breath , no abdominal pain. No new focal weakness.   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  2. Acute renal failure superimposed on stage 5 chronic kidney disease, not on chronic dialysis ([place]) Post Gentle IVF. Nephrology following.  Continue chronic sodium bicarbonate administration.   3. Hypothyroidism - stable TSH, not on replacement.   4. HTN - on Cardizem now.  5.  Dyslipidemia.  On statin continue at home dose.      Condition - Extremely Guarded  Family Communication  :  friend bedside  Code Status :  Full  Consults  :  EP, Renal  PUD Prophylaxis :     Procedures  :     Temp pacemaker placed - 12/10/2020 - Dr Martin[place]  PPM - 12/15/2020      Disposition Plan  :    Status is: Inpatient  Remains inpatient appropriate because: pacemaker needed  DVT Prophylaxis  :     Place and maintain sequential compression device Start: 12/16/2020 1747 SCDs Start: 12/05/2020 2024    Lab Results  Component Value Date   PLT 250 12/22/2020    Diet :  Diet Order             Diet Heart Room service appropriate? Yes; Fluid consistency: Thin  Diet effective now                    Inpatient Medications  Scheduled Meds:  Chlorhexidine Gluconate Cloth  6 each Topical Q0600   diltiazem  60 mg Oral Q8H   mupirocin ointment  1 application Nasal BID   oxyCODONE-acetaminophen  1 tablet Oral Q6H   pravastatin  20 mg Oral Daily   sodium bicarbonate  1,300 mg Oral TID   sodium chloride flush  10-40 mL Intracatheter Q12H   Continuous Infusions:  amiodarone 30 mg/hr (12/22/20 0800)   PRN Meds:.fentaNYL, melatonin, [DISCONTINUED] ondansetron **OR** ondansetron (ZOFRAN) IV, traMADol, zolpidem  Antibiotics  :    Anti-infectives (From admission, onward)    Start     Dose/Rate Route Frequency Ordered Stop   12/10/2020 2200  ceFAZolin (ANCEF) IVPB 1 g/50 mL premix        1 g 100 mL/hr over 30 Minutes Intravenous Every 12 hours 12/13/2020 1259 12/28/2020 2209   12/15/2020 1400  gentamicin (GARAMYCIN) 80 mg in sodium chloride 0.9 % 500 mL irrigation        80 mg Irrigation On call 12/17/2020 0724 12/27/2020 1100   12/13/2020 1400  ceFAZolin (ANCEF) IVPB 2g/100 mL premix        2 g 200 mL/hr over 30 Minutes Intravenous On call 12/05/2020 0724 01/02/2021 1037   12/26/2020 1200  ceFAZolin (ANCEF) IVPB 1 g/50 mL premix  Status:  Discontinued        1 g 100 mL/hr over 30 Minutes Intravenous Every 12 hours 12/23/2020 1155 12/28/2020 1259        Time Spent in minutes  30   Lala Lund M.D on 12/22/2020 at 8:56 AM  To page go to [email or web address]   [facility] -   Office  [phone]  See all Orders from today for further details    Objective:   Vitals:   12/22/20 0515 12/22/20 0519 12/22/20 0700 12/22/20 0745  BP: 102/66 102/66 122/70   Pulse:      Resp: (!) 24 (!) 21 15   Temp:    97.7 F (36.5 C)  TempSrc:    Oral  SpO2: 92% 92% 95%   Weight:      Height:        Wt Readings from Last 3 Encounters:  12/06/2020 58 kg  11/27/20 55.3 kg  06/28/20 53.2 kg     Intake/Output Summary (Last 24 hours) at 12/22/2020 0856 Last data filed at 12/22/2020 0800 Gross per 24 hour  Intake 2041.22 ml  Output 450 ml  Net 1591.22 ml     Physical Exam  Awake Alert, No new F.N deficits, Normal affect [place].AT,PERRAL Supple Neck, No JVD,   Symmetrical Chest wall movement, Good air movement bilaterally, few rales, L PPM, L shoulder in a sling RRR,No Gallops, Rubs or new Murmurs,  +ve B.Sounds, Abd Soft, No tenderness,   No Cyanosis, Clubbing or edema,          Data Review:    CBC Recent Labs  Lab 12/15/2020 1626 12/18/2020 0439 12/22/2020 0130 12/22/20 0125  WBC 13.7* 11.5*  10.2 11.2*  HGB 10.5* 10.1* 8.1* 8.5*  HCT 32.6* 30.7* 24.6* 26.5*  PLT 352 374 259 250  MCV 108.3* 107.7* 107.9* 107.7*  MCH 34.9* 35.4* 35.5* 34.6*  MCHC 32.2 32.9 32.9 32.1  RDW 16.3* 16.4* 16.3* 16.1*  LYMPHSABS 4.0 4.4* 4.3* 3.6  MONOABS 1.3* 1.0 1.0 0.9  EOSABS 0.1 0.2 0.3 0.2  BASOSABS 0.1 0.1 0.1 0.1    Recent Labs  Lab 12/27/2020 1626 12/12/2020 0439 12/05/2020 0812 12/07/2020 0130 12/22/20 0125  NA 138 137  --  134* 136  K 4.2 3.9  --  3.5 4.2  CL 110 112*  --  107 103  CO2 16* 14*  --  21* 24  GLUCOSE 96 90  --  91 151*  BUN 35* 33*  --  29* 31*  CREATININE 6.23* 5.64*  --  5.00* 5.40*  CALCIUM 9.5 9.0  --  7.9* 8.5*  AST 19 15  --  12* 20  ALT 10 8  --  8 9  ALKPHOS 120 109  --  91 111  BILITOT 0.6 0.4  --  0.1* 0.2*  ALBUMIN 3.1* 2.6*  --  2.2* 2.3*  MG 1.7 2.4  --  2.1 1.9  TSH  --   --  4.122  --   --   HGBA1C  --   --   --  4.9  --    BNP  --   --   --  1,254.7* 1,029.9*    ------------------------------------------------------------------------------------------------------------------ Recent Labs    12/18/2020 2208 12/23/2020 0130  CHOL 121 78  HDL 45 38*  LDLCALC 65 30  TRIG 54 50  CHOLHDL 2.7 2.1    Lab Results  Component Value Date   HGBA1C 4.9 12/05/2020   ------------------------------------------------------------------------------------------------------------------ Recent Labs    12/14/2020 0812  TSH 4.122    Cardiac Enzymes No results for input(s): CKMB, TROPONINI, MYOGLOBIN in the last 168 hours.  Invalid input(s): CK ------------------------------------------------------------------------------------------------------------------    Component Value Date/Time   BNP 1,029.9 (H) 12/22/2020 0125     Radiology Reports CARDIAC CATHETERIZATION  Result Date: 12/07/2020 Successful temporary transvenous pacemaker insertion via right IJ Further plans per rounding team.   EP PPM/ICD IMPLANT  Result Date: 12/31/2020 CONCLUSIONS:  1. Successful implantation of a St. Jude dual-chamber pacemaker for symptomatic bradycardia due to sinus node dysfunction  2. No early apparent complications.       Cristopher Peru, MD 12/16/2020 11:41 AM    DG CHEST PORT 1 VIEW  Result Date: 12/10/2020 CLINICAL DATA:  Status post pacemaker. EXAM: PORTABLE CHEST 1 VIEW COMPARISON:  Chest x-ray 12/25/2020. FINDINGS: There is a new left-sided dual lead pacemaker in place. There is no evidence for pneumothorax. There are some linear atelectatic changes in the left lung base. The lungs are otherwise clear. There is no pleural effusion identified. Right costophrenic angle has been excluded from the exam. Cardiomediastinal silhouette is within normal limits. IMPRESSION: 1. New left-sided pacemaker in place. 2. No pneumothorax. 3. Left lower lung atelectasis. Electronically Signed   By: Ronney Asters M.D.   On: 12/05/2020 17:00   DG  Chest Portable 1 View  Result Date: 12/17/2020 CLINICAL DATA:  Palpitations EXAM: PORTABLE CHEST 1 VIEW COMPARISON:  06/25/2020 FINDINGS: The heart size and mediastinal contours are within normal limits. Both lungs are clear. The visualized skeletal structures are unremarkable. IMPRESSION: No acute abnormality of the lungs in AP portable projection. Electronically Signed   By: Delanna Ahmadi M.D.   On:  12/30/2020 16:44   ECHOCARDIOGRAM COMPLETE  Result Date: 12/16/2020    ECHOCARDIOGRAM REPORT   Patient Name:   LORESE GIROUARD Date of Exam: 12/28/2020 Medical Rec #:  [id number]   Height:       63.0 in Accession #:    [id number]  Weight:       127.9 lb Date of Birth:  1950/05/13    BSA:          1.599 m Patient Age:    43 years    BP:           107/86 mmHg Patient Gender: F           HR:           122 bpm. Exam Location:  Inpatient Procedure: 2D Echo, Cardiac Doppler and Color Doppler STAT ECHO Indications:    for Pacemaker today  History:        Patient has no prior history of Echocardiogram examinations.                 Arrythmias:Atrial Fibrillation, Tachycardia and Heart block.  Sonographer:    Merrie Roof RDCS Referring Phys: [id number] [place]  1. Left ventricular ejection fraction, by estimation, is 65 to 70%. The left ventricle has normal function. The left ventricle has no regional wall motion abnormalities. Left ventricular diastolic parameters are consistent with Grade I diastolic dysfunction (impaired relaxation).  2. Right ventricular systolic function is normal. The right ventricular size is normal. There is normal pulmonary artery systolic pressure. The estimated right ventricular systolic pressure is [id number] mmHg.  3. The mitral valve is normal in structure. Mild mitral valve regurgitation. No evidence of mitral stenosis.  4. The aortic valve is normal in structure. Aortic valve regurgitation is not visualized. No aortic stenosis is present.  5. The inferior vena cava is dilated  in size with >50% respiratory variability, suggesting right atrial pressure of 8 mmHg. FINDINGS  Left Ventricle: Left ventricular ejection fraction, by estimation, is 65 to 70%. The left ventricle has normal function. The left ventricle has no regional wall motion abnormalities. The left ventricular internal cavity size was normal in size. There is  no left ventricular hypertrophy. Left ventricular diastolic parameters are consistent with Grade I diastolic dysfunction (impaired relaxation). Right Ventricle: The right ventricular size is normal. No increase in right ventricular wall thickness. Right ventricular systolic function is normal. There is normal pulmonary artery systolic pressure. The tricuspid regurgitant velocity is 2.08 m/s, and  with an assumed right atrial pressure of 8 mmHg, the estimated right ventricular systolic pressure is 99991111 mmHg. Left Atrium: Left atrial size was normal in size. Right Atrium: Right atrial size was normal in size. Pericardium: There is no evidence of pericardial effusion. Mitral Valve: The mitral valve is normal in structure. Mild mitral valve regurgitation. No evidence of mitral valve stenosis. Tricuspid Valve: The tricuspid valve is normal in structure. Tricuspid valve regurgitation is mild . No evidence of tricuspid stenosis. Aortic Valve: The aortic valve is normal in structure. Aortic valve regurgitation is not visualized. No aortic stenosis is present. Aortic valve mean gradient measures 3.0 mmHg. Aortic valve peak gradient measures 5.6 mmHg. Aortic valve area, by VTI measures 2.21 cm. Pulmonic Valve: The pulmonic valve was normal in structure. Pulmonic valve regurgitation is not visualized. No evidence of pulmonic stenosis. Aorta: The aortic root is normal in size and structure. Venous: The inferior vena cava is dilated in size with greater than 50% respiratory variability, suggesting  right atrial pressure of 8 mmHg. IAS/Shunts: No atrial level shunt detected by color  flow Doppler. Additional Comments: A device lead is visualized in the right ventricle.  LEFT VENTRICLE PLAX 2D LVIDd:         3.70 cm LVIDs:         2.70 cm LV PW:         1.10 cm LV IVS:        1.00 cm LVOT diam:     2.00 cm LV SV:         42 LV SV Index:   26 LVOT Area:     3.14 cm  RIGHT VENTRICLE             IVC RV Basal diam:  2.60 cm     IVC diam: 2.40 cm RV S prime:     14.70 cm/s TAPSE (M-mode): 1.1 cm LEFT ATRIUM             Index        RIGHT ATRIUM           Index LA diam:        3.90 cm 2.44 cm/m   RA Area:     15.00 cm LA Vol (A2C):   56.3 ml 35.21 ml/m  RA Volume:   36.70 ml  22.95 ml/m LA Vol (A4C):   46.8 ml 29.27 ml/m LA Biplane Vol: 54.1 ml 33.84 ml/m  AORTIC VALVE AV Area (Vmax):    2.45 cm AV Area (Vmean):   2.09 cm AV Area (VTI):     2.21 cm AV Vmax:           118.00 cm/s AV Vmean:          84.800 cm/s AV VTI:            0.189 m AV Peak Grad:      5.6 mmHg AV Mean Grad:      3.0 mmHg LVOT Vmax:         92.20 cm/s LVOT Vmean:        56.400 cm/s LVOT VTI:          0.133 m LVOT/AV VTI ratio: 0.70  AORTA Ao Root diam: 2.80 cm Ao Asc diam:  2.70 cm TRICUSPID VALVE TR Peak grad:   17.3 mmHg TR Vmax:        208.00 cm/s  SHUNTS Systemic VTI:  0.13 m Systemic Diam: 2.00 cm Candee Furbish MD Electronically signed by Candee Furbish MD Signature Date/Time: 12/27/2020/9:33:05 AM    Final

## 2020-12-22 NOTE — Progress Notes (Addendum)
Electrophysiology Rounding Note  Patient Name: Debra Petersen Date of Encounter: 12/22/2020  Primary Cardiologist: None Electrophysiologist: Dr. Caryl Comes -> Dr. Lovena Le   Subjective   The patient is doing well today. Chest discomfort much improved. Back in AF with variable rates overnight. At this time, the patient denies chest pain, shortness of breath, or any new concerns.  Inpatient Medications    Scheduled Meds:  Chlorhexidine Gluconate Cloth  6 each Topical Q0600   mupirocin ointment  1 application Nasal BID   oxyCODONE-acetaminophen  1 tablet Oral Q6H   pravastatin  20 mg Oral Daily   sodium bicarbonate  1,300 mg Oral TID   sodium chloride flush  10-40 mL Intracatheter Q12H   Continuous Infusions:  amiodarone 30 mg/hr (12/22/20 0612)   diltiazem (CARDIZEM) infusion 12.5 mg/hr (12/10/2020 0939)   PRN Meds: fentaNYL, melatonin, midazolam, ondansetron **OR** ondansetron (ZOFRAN) IV, ondansetron (ZOFRAN) IV, traMADol, zolpidem   Vital Signs    Vitals:   12/22/20 0500 12/22/20 0515 12/22/20 0519 12/22/20 0700  BP: (!) 98/59 102/66 102/66 122/70  Pulse:      Resp: 14 (!) 24 (!) 21 15  Temp:      TempSrc:      SpO2: 96% 92% 92% 95%  Weight:      Height:        Intake/Output Summary (Last 24 hours) at 12/22/2020 0707 Last data filed at 12/22/2020 0400 Gross per 24 hour  Intake 1545.06 ml  Output 350 ml  Net 1195.06 ml   Filed Weights   12/27/2020 1558 01/03/2021 0522  Weight: 56.7 kg 58 kg    Physical Exam    GEN- The patient is well appearing, alert and oriented x 3 today.   Head- normocephalic, atraumatic Eyes-  Sclera clear, conjunctiva pink Ears- hearing intact Oropharynx- clear Neck- supple Lungs- Clear to ausculation bilaterally, normal work of breathing Heart- Irregularly irregular rate and rhythm, no murmurs, rubs or gallops GI- soft, NT, ND, + BS Extremities- no clubbing or cyanosis. No edema Skin- no rash or lesion Psych- euthymic mood, full  affect Neuro- strength and sensation are intact  Labs    CBC Recent Labs    12/28/2020 0130 12/22/20 0125  WBC 10.2 11.2*  NEUTROABS 4.5 6.4  HGB 8.1* 8.5*  HCT 24.6* 26.5*  MCV 107.9* 107.7*  PLT 259 [id number]   Basic Metabolic Panel Recent Labs    01/03/2021 0130 12/22/20 0125  NA 134* 136  K 3.5 4.2  CL 107 103  CO2 21* 24  GLUCOSE 91 151*  BUN 29* 31*  CREATININE 5.00* 5.40*  CALCIUM 7.9* 8.5*  MG 2.1 1.9   Liver Function Tests Recent Labs    12/09/2020 0130 12/22/20 0125  AST 12* 20  ALT 8 9  ALKPHOS 91 111  BILITOT 0.1* 0.2*  PROT 4.8* 5.1*  ALBUMIN 2.2* 2.3*   No results for input(s): LIPASE, AMYLASE in the last 72 hours. Cardiac Enzymes No results for input(s): CKTOTAL, CKMB, CKMBINDEX, TROPONINI in the last 72 hours.   Telemetry    Back into AF with RVR 110-130s just prior to 0130 (personally reviewed)  Radiology    CARDIAC CATHETERIZATION  Result Date: 12/18/2020 Successful temporary transvenous pacemaker insertion via right IJ Further plans per rounding team.   EP PPM/ICD IMPLANT  Result Date: 12/13/2020 CONCLUSIONS:  1. Successful implantation of a St. Jude dual-chamber pacemaker for symptomatic bradycardia due to sinus node dysfunction  2. No early apparent complications.  Cristopher Peru, MD 01/03/2021 11:41 AM    DG CHEST PORT 1 VIEW  Result Date: 12/27/2020 CLINICAL DATA:  Status post pacemaker. EXAM: PORTABLE CHEST 1 VIEW COMPARISON:  Chest x-ray 12/25/2020. FINDINGS: There is a new left-sided dual lead pacemaker in place. There is no evidence for pneumothorax. There are some linear atelectatic changes in the left lung base. The lungs are otherwise clear. There is no pleural effusion identified. Right costophrenic angle has been excluded from the exam. Cardiomediastinal silhouette is within normal limits. IMPRESSION: 1. New left-sided pacemaker in place. 2. No pneumothorax. 3. Left lower lung atelectasis. Electronically Signed   By: Ronney Asters M.D.   On: 12/10/2020 17:00   ECHOCARDIOGRAM COMPLETE  Result Date: 01/02/2021    ECHOCARDIOGRAM REPORT   Patient Name:   Debra Petersen Date of Exam: 12/27/2020 Medical Rec #:  [id number]   Height:       63.0 in Accession #:    [id number]  Weight:       127.9 lb Date of Birth:  May 27, 1950    BSA:          1.599 m Patient Age:    70 years    BP:           107/86 mmHg Patient Gender: F           HR:           122 bpm. Exam Location:  Inpatient Procedure: 2D Echo, Cardiac Doppler and Color Doppler STAT ECHO Indications:    for Pacemaker today  History:        Patient has no prior history of Echocardiogram examinations.                 Arrythmias:Atrial Fibrillation, Tachycardia and Heart block.  Sonographer:    Merrie Roof RDCS Referring Phys: [id number] [place]  1. Left ventricular ejection fraction, by estimation, is 65 to 70%. The left ventricle has normal function. The left ventricle has no regional wall motion abnormalities. Left ventricular diastolic parameters are consistent with Grade I diastolic dysfunction (impaired relaxation).  2. Right ventricular systolic function is normal. The right ventricular size is normal. There is normal pulmonary artery systolic pressure. The estimated right ventricular systolic pressure is 99991111 mmHg.  3. The mitral valve is normal in structure. Mild mitral valve regurgitation. No evidence of mitral stenosis.  4. The aortic valve is normal in structure. Aortic valve regurgitation is not visualized. No aortic stenosis is present.  5. The inferior vena cava is dilated in size with >50% respiratory variability, suggesting right atrial pressure of 8 mmHg. FINDINGS  Left Ventricle: Left ventricular ejection fraction, by estimation, is 65 to 70%. The left ventricle has normal function. The left ventricle has no regional wall motion abnormalities. The left ventricular internal cavity size was normal in size. There is  no left ventricular hypertrophy.  Left ventricular diastolic parameters are consistent with Grade I diastolic dysfunction (impaired relaxation). Right Ventricle: The right ventricular size is normal. No increase in right ventricular wall thickness. Right ventricular systolic function is normal. There is normal pulmonary artery systolic pressure. The tricuspid regurgitant velocity is 2.08 m/s, and  with an assumed right atrial pressure of 8 mmHg, the estimated right ventricular systolic pressure is 99991111 mmHg. Left Atrium: Left atrial size was normal in size. Right Atrium: Right atrial size was normal in size. Pericardium: There is no evidence of pericardial effusion. Mitral Valve: The mitral valve is normal in structure. Mild mitral  valve regurgitation. No evidence of mitral valve stenosis. Tricuspid Valve: The tricuspid valve is normal in structure. Tricuspid valve regurgitation is mild . No evidence of tricuspid stenosis. Aortic Valve: The aortic valve is normal in structure. Aortic valve regurgitation is not visualized. No aortic stenosis is present. Aortic valve mean gradient measures 3.0 mmHg. Aortic valve peak gradient measures 5.6 mmHg. Aortic valve area, by VTI measures 2.21 cm. Pulmonic Valve: The pulmonic valve was normal in structure. Pulmonic valve regurgitation is not visualized. No evidence of pulmonic stenosis. Aorta: The aortic root is normal in size and structure. Venous: The inferior vena cava is dilated in size with greater than 50% respiratory variability, suggesting right atrial pressure of 8 mmHg. IAS/Shunts: No atrial level shunt detected by color flow Doppler. Additional Comments: A device lead is visualized in the right ventricle.  LEFT VENTRICLE PLAX 2D LVIDd:         3.70 cm LVIDs:         2.70 cm LV PW:         1.10 cm LV IVS:        1.00 cm LVOT diam:     2.00 cm LV SV:         42 LV SV Index:   26 LVOT Area:     3.14 cm  RIGHT VENTRICLE             IVC RV Basal diam:  2.60 cm     IVC diam: 2.40 cm RV S prime:     14.70  cm/s TAPSE (M-mode): 1.1 cm LEFT ATRIUM             Index        RIGHT ATRIUM           Index LA diam:        3.90 cm 2.44 cm/m   RA Area:     15.00 cm LA Vol (A2C):   56.3 ml 35.21 ml/m  RA Volume:   36.70 ml  22.95 ml/m LA Vol (A4C):   46.8 ml 29.27 ml/m LA Biplane Vol: 54.1 ml 33.84 ml/m  AORTIC VALVE AV Area (Vmax):    2.45 cm AV Area (Vmean):   2.09 cm AV Area (VTI):     2.21 cm AV Vmax:           118.00 cm/s AV Vmean:          84.800 cm/s AV VTI:            0.189 m AV Peak Grad:      5.6 mmHg AV Mean Grad:      3.0 mmHg LVOT Vmax:         92.20 cm/s LVOT Vmean:        56.400 cm/s LVOT VTI:          0.133 m LVOT/AV VTI ratio: 0.70  AORTA Ao Root diam: 2.80 cm Ao Asc diam:  2.70 cm TRICUSPID VALVE TR Peak grad:   17.3 mmHg TR Vmax:        208.00 cm/s  SHUNTS Systemic VTI:  0.13 m Systemic Diam: 2.00 cm Candee Furbish MD Electronically signed by Candee Furbish MD Signature Date/Time: 12/15/2020/9:33:05 AM    Final     Patient Profile     Debra Petersen is a 70 y.o. female with a past medical history significant for atrial tachycardia, HTN, syncope, CKD IV, atrial flutter s/p ablation, and paroxysmal atrial fibrillation.  she was admitted for weakness and palpitations with  episodes of near syncope. Found to be in rapid AF on arrival to ED. She was started on diltiazem and noted to have significant pauses with near syncope. Urgent temporary pacer implanted 12/25/2020. EP asked to see for PPM consideration.  Assessment & Plan    Tachy-brady syndrome 2.  Atrial fibrillation with RVR Temp wire placed 12/10/2020 Now s/p DDD St Jude PPM Interrogation this am with stable device function Wound site appears stable Chest discomfort "much improved"  Will discuss transition of amio to po with MD, she currently remains in AF with RVR Has been off diltiazem. Will start diltiazem 60 mg q 8 hrs and titrate up as needed; consolidate prior to d/c.  CHA2DS2/VASc is at least three and will need OAC once stable post  PPM.    3. CKD V Her baseline creatinine is in the 4-5 range. Creatinine up slightly again today 5.40, likely with NPO status yesterday and on-going RVR   4. Elevated Troponin  HS-Trop 1843 -> 1658 -> 1457 -> 1294.P Thought to be in setting of demand ischemia with CKD V Myoview 2021 without ischemia Echo this admission with normal EF  ADDENDUM CXR this am with stable appearing leads  For questions or updates, please contact [facility] Please consult [email or web address] for contact info under Cardiology/STEMI.  Signed, Shirley Friar, PA-C  12/22/2020, 7:07 AM   EP Attending  Patient seen and examined. Agree with above. The patient is improved today and her pain is essentially resolved except for chest pain with palpation of the chest. Her leads are working normally and her cxr looks good. She has atrial fib with a controlled VR. She will continue IV amio today and switch to oral amio tomorrow.  Carleene Overlie Martine Bleecker,MD

## 2020-12-22 NOTE — Progress Notes (Signed)
Admit: 12/13/2020 LOS: 3  69F [name], hx/o SLE and LN, admit with AF+RVR --> symptomatic bradycardia s/p temp PM.   Subjective:  S/p PPM 12/07/2020 NO c/o this AM; feels well; no uremic S/Sx SCr 5.4, K 4.2, Hb up to 8.5 On RA, UOP not fully measured  10/17 0701 - 10/18 0700 In: 1545.1 [P.O.:840; I.V.:655.1; IV Piggyback:50] Out: [id number] Weights   12/07/2020 1558 12/26/2020 0522  Weight: 56.7 kg 58 kg    Scheduled Meds:  Chlorhexidine Gluconate Cloth  6 each Topical Q0600   diltiazem  60 mg Oral Q8H   mupirocin ointment  1 application Nasal BID   oxyCODONE-acetaminophen  1 tablet Oral Q6H   pravastatin  20 mg Oral Daily   sodium bicarbonate  1,300 mg Oral TID   sodium chloride flush  10-40 mL Intracatheter Q12H   Continuous Infusions:  amiodarone 30 mg/hr (12/22/20 0612)   PRN Meds:.fentaNYL, melatonin, midazolam, ondansetron **OR** ondansetron (ZOFRAN) IV, ondansetron (ZOFRAN) IV, traMADol, zolpidem  Current Labs: reviewed    Physical Exam:  Blood pressure 122/70, pulse 68, temperature 97.7 F (36.5 C), temperature source Oral, resp. rate 15, height '5\' 3"'$  (1.6 m), weight 58 kg, SpO2 95 %. NAD, wa/wd, conversant Tachy, irriegular R IJ Cordis and PM present NO rub CTAB No LEE Nonfocal  A AoCKD5, [name] follows. [facility] KT eval in process.  Plan for PD as RRT.  Stable GFR, K and HCO3 stable/improved. No uremia.   AF + RVR and symptomatic bradycardia, s/p PPM. AMion and CCB for rate control Metabolic acidosis, improved on PO and IV HCO3 Anemia of CKD on ESA as outpt, TSAT ok.  Hb down from admission, no overt losses HTN, normal, no BP meds except dilt  P No RRT needed Trend Hb  Daily weights, Daily Renal Panel, Strict I/Os, Avoid nephrotoxins (NSAIDs, judicious IV Contrast)  Medication Issues; Preferred narcotic agents for pain control are hydromorphone, fentanyl, and methadone. Morphine should not be used.  Baclofen should be avoided Avoid  oral sodium phosphate and magnesium citrate based laxatives / bowel preps    [name] MD 12/22/2020, 8:36 AM  Recent Labs  Lab 01/03/2021 0439 12/15/2020 0130 12/22/20 0125  NA 137 134* 136  K 3.9 3.5 4.2  CL 112* 107 103  CO2 14* 21* 24  GLUCOSE 90 91 151*  BUN 33* 29* 31*  CREATININE 5.64* 5.00* 5.40*  CALCIUM 9.0 7.9* 8.5*    Recent Labs  Lab 12/13/2020 0439 12/26/2020 0130 12/22/20 0125  WBC 11.5* 10.2 11.2*  NEUTROABS 5.8 4.5 6.4  HGB 10.1* 8.1* 8.5*  HCT 30.7* 24.6* 26.5*  MCV 107.7* 107.9* 107.7*  PLT 374 259 250

## 2020-12-22 NOTE — Plan of Care (Signed)

## 2020-12-22 NOTE — Progress Notes (Signed)
Pt HR has returned to 125-135.  Pt remains asymptomatic and denies pain. Awaiting response.   12/22/20 0519  Vitals  BP 102/66  MAP (mmHg) 74  BP Location Right Arm  BP Method Automatic  Patient Position (if appropriate) Lying  ECG Heart Rate (!) 135  Resp (!) 21  Level of Consciousness  Level of Consciousness Alert  Oxygen Therapy  SpO2 92 %  O2 Device Nasal Cannula  O2 Flow Rate (L/min) 4 L/min  Pain Assessment  Pain Scale 0-10  Pain Score 0  MEWS Score  MEWS Temp 0  MEWS Systolic 0  MEWS Pulse 3  MEWS RR 1  MEWS LOC 0  MEWS Score 4  MEWS Score Color Red

## 2020-12-23 ENCOUNTER — Inpatient Hospital Stay (HOSPITAL_COMMUNITY): Payer: Medicare Other

## 2020-12-23 DIAGNOSIS — I3139 Other pericardial effusion (noninflammatory): Secondary | ICD-10-CM

## 2020-12-23 DIAGNOSIS — N179 Acute kidney failure, unspecified: Secondary | ICD-10-CM | POA: Diagnosis not present

## 2020-12-23 DIAGNOSIS — N185 Chronic kidney disease, stage 5: Secondary | ICD-10-CM | POA: Diagnosis not present

## 2020-12-23 DIAGNOSIS — R778 Other specified abnormalities of plasma proteins: Secondary | ICD-10-CM | POA: Diagnosis not present

## 2020-12-23 DIAGNOSIS — Z950 Presence of cardiac pacemaker: Secondary | ICD-10-CM

## 2020-12-23 DIAGNOSIS — I10 Essential (primary) hypertension: Secondary | ICD-10-CM | POA: Diagnosis not present

## 2020-12-23 DIAGNOSIS — E039 Hypothyroidism, unspecified: Secondary | ICD-10-CM

## 2020-12-23 DIAGNOSIS — I9589 Other hypotension: Secondary | ICD-10-CM

## 2020-12-23 LAB — CBC WITH DIFFERENTIAL/PLATELET
Abs Immature Granulocytes: 0.17 10*3/uL — ABNORMAL HIGH (ref 0.00–0.07)
Basophils Absolute: 0 10*3/uL (ref 0.0–0.1)
Basophils Relative: 0 %
Eosinophils Absolute: 0 10*3/uL (ref 0.0–0.5)
Eosinophils Relative: 0 %
HCT: 29.9 % — ABNORMAL LOW (ref 36.0–46.0)
Hemoglobin: 9.9 g/dL — ABNORMAL LOW (ref 12.0–15.0)
Immature Granulocytes: 1 %
Lymphocytes Relative: 19 %
Lymphs Abs: 4.2 10*3/uL — ABNORMAL HIGH (ref 0.7–4.0)
MCH: 35.7 pg — ABNORMAL HIGH (ref 26.0–34.0)
MCHC: 33.1 g/dL (ref 30.0–36.0)
MCV: 107.9 fL — ABNORMAL HIGH (ref 80.0–100.0)
Monocytes Absolute: 2 10*3/uL — ABNORMAL HIGH (ref 0.1–1.0)
Monocytes Relative: 9 %
Neutro Abs: 15.5 10*3/uL — ABNORMAL HIGH (ref 1.7–7.7)
Neutrophils Relative %: 71 %
Platelets: 273 10*3/uL (ref 150–400)
RBC: 2.77 MIL/uL — ABNORMAL LOW (ref 3.87–5.11)
RDW: 16.3 % — ABNORMAL HIGH (ref 11.5–15.5)
WBC: 21.9 10*3/uL — ABNORMAL HIGH (ref 4.0–10.5)
nRBC: 0.2 % (ref 0.0–0.2)

## 2020-12-23 LAB — COMPREHENSIVE METABOLIC PANEL
ALT: 6 U/L (ref 0–44)
AST: 35 U/L (ref 15–41)
Albumin: 2.5 g/dL — ABNORMAL LOW (ref 3.5–5.0)
Alkaline Phosphatase: 224 U/L — ABNORMAL HIGH (ref 38–126)
Anion gap: 13 (ref 5–15)
BUN: 34 mg/dL — ABNORMAL HIGH (ref 8–23)
CO2: 24 mmol/L (ref 22–32)
Calcium: 9 mg/dL (ref 8.9–10.3)
Chloride: 94 mmol/L — ABNORMAL LOW (ref 98–111)
Creatinine, Ser: 5.91 mg/dL — ABNORMAL HIGH (ref 0.44–1.00)
GFR, Estimated: 7 mL/min — ABNORMAL LOW (ref >60)
Glucose, Bld: 100 mg/dL — ABNORMAL HIGH (ref 70–99)
Potassium: 4.6 mmol/L (ref 3.5–5.1)
Sodium: 131 mmol/L — ABNORMAL LOW (ref 135–145)
Total Bilirubin: 0.3 mg/dL (ref 0.3–1.2)
Total Protein: 6.1 g/dL — ABNORMAL LOW (ref 6.5–8.1)

## 2020-12-23 LAB — BRAIN NATRIURETIC PEPTIDE: B Natriuretic Peptide: 1454.2 pg/mL — ABNORMAL HIGH (ref 0.0–100.0)

## 2020-12-23 LAB — ECHOCARDIOGRAM LIMITED
Height: 63 in
S' Lateral: 2.7 cm
Weight: 2292.78 oz

## 2020-12-23 LAB — MAGNESIUM: Magnesium: 1.7 mg/dL (ref 1.7–2.4)

## 2020-12-23 LAB — LACTIC ACID, PLASMA
Lactic Acid, Venous: 2 mmol/L (ref 0.5–1.9)
Lactic Acid, Venous: 1.1 mmol/L (ref 0.5–1.9)

## 2020-12-23 LAB — PROCALCITONIN: Procalcitonin: 4.91 ng/mL

## 2020-12-23 MED ORDER — SODIUM CHLORIDE 0.9 % IV SOLN
1.0000 g | INTRAVENOUS | Status: DC
  Administered 2020-12-23 – 2020-12-26 (×4): 1 g via INTRAVENOUS
  Filled 2020-12-23 (×6): qty 1

## 2020-12-23 MED ORDER — NOREPINEPHRINE 4 MG/250ML-% IV SOLN
2.0000 ug/min | INTRAVENOUS | Status: DC
Start: 2020-12-23 — End: 2020-12-24
  Administered 2020-12-23: 2 ug/min via INTRAVENOUS
  Filled 2020-12-23: qty 250

## 2020-12-23 MED ORDER — OXYCODONE-ACETAMINOPHEN 5-325 MG PO TABS
1.0000 | ORAL_TABLET | Freq: Four times a day (QID) | ORAL | Status: DC | PRN
Start: 2020-12-23 — End: 2021-01-01
  Administered 2020-12-30 – 2020-12-31 (×2): 1 via ORAL
  Filled 2020-12-23 (×2): qty 1

## 2020-12-23 MED ORDER — SODIUM CHLORIDE 0.9 % IV SOLN: 250.0000 mL | INTRAVENOUS | Status: DC

## 2020-12-23 MED ORDER — MAGNESIUM SULFATE 2 GM/50ML IV SOLN
2.0000 g | Freq: Once | INTRAVENOUS | Status: AC
  Administered 2020-12-23: 2 g via INTRAVENOUS
  Filled 2020-12-23: qty 50

## 2020-12-23 MED ORDER — DILTIAZEM HCL 60 MG PO TABS
120.0000 mg | ORAL_TABLET | Freq: Once | ORAL | Status: AC
  Administered 2020-12-23: 120 mg via ORAL
  Filled 2020-12-23: qty 2

## 2020-12-23 MED ORDER — DILTIAZEM HCL 60 MG PO TABS
120.0000 mg | ORAL_TABLET | Freq: Three times a day (TID) | ORAL | Status: DC
  Administered 2020-12-23 – 2020-12-27 (×12): 120 mg via ORAL
  Filled 2020-12-23 (×13): qty 2

## 2020-12-23 MED ORDER — NOREPINEPHRINE 4 MG/250ML-% IV SOLN: 0.0000 ug/min | INTRAVENOUS | Status: DC

## 2020-12-23 NOTE — Progress Notes (Signed)
PROGRESS NOTE    Debra Petersen  [id number] DOB: August 10, 1950 DOA: 12/25/2020 PCP: Prince Solian, MD   Brief Narrative: 70 year old with past medical history significant for hypertension, history of CKD stage V, currently on [facility] evaluation [facility], history of lupus nephritis more than 3 decades ago presents to the ER with 2 days history of weakness.  Patient was found to be initially in A. fib with RVR thereafter developed bradycardia.  EP was consulted and she underwent temporary pacemaker placement followed by permanent pacemaker on 12/22/2020.  Overnight, patient developed hypotension worsening hypoxemia currently on 5 L of oxygen, she was transiently on Levophed.  Chest x-ray; 10/18 Increasing right infrahilar opacity may reflect worsening atelectasis or new infiltrate. Unchanged mild interstitial edema and probable trace bilateral pleural effusions.   Assessment & Plan:   Principal Problem:   Atrial fibrillation with rapid ventricular response (HCC) Active Problems:   Hypothyroidism   Acute renal failure superimposed on stage 5 chronic kidney disease, not on chronic dialysis ([place])   Chronic kidney disease   Hypertension   Tachycardia-bradycardia syndrome ([place])  1-Hypotension, leukocytosis; concern for sepsis: Hypotension could be related to medications (Cardizem) CCM consulted.  She was transiently on Levophed overnight. Denies abdominal pain.  She reported mild pain near the pacemaker site. Started IV cefepime.  Check lactic acid.  MRSA PCR> was negative 3 days ago.  Will change pain medications to PRN to avoid oversedation and hypotension.  Also cardiology order ECHO; small to moderate pericardial effusion. If become more hypotensive might need to urgent cardioversion per cardiology.   2-Acute hypoxic respiratory failure: -Component of pneumonia nor pulmonary edema. -Unable to proceed with IV Lasix due to soft blood pressure.  Will defer diuresis to  cardiologist. -Plan to repeat chest x-ray today. -CCM consulted.   3-Acute renal failure superimposed on stage V CKD Not on hemodialysis. Continue sodium bicarb Monitor urine output. Nephrology following. Might require RRT during this admission.   4-Sick sinus syndrome, A. fib with RVR, tachybradycardia syndrome: New pacemaker insertion this admission 12/13/2020.  Anticoagulation per EP. A. fib with RVR currently, currently on amiodarone drip. On oral Cardizem, may have limitation with Cardizem due to low blood pressure  5-Hypothyroidism: Stable TSH, placement. Dyslipidemia: On statins   Estimated body mass index is 25.38 kg/m as calculated from the following:   Height as of this encounter: '5\' 3"'$  (1.6 m).   Weight as of this encounter: 65 kg.   DVT prophylaxis: SCD Code Status: Full code Family Communication: friend who was at bedside Disposition Plan:  Status is: Inpatient  Remains inpatient appropriate because: hypotension, hypoxemia        Consultants:  CCM Cardiology nephrology  Procedures:  Pacemaker.   Antimicrobials:  Cefepime 10/19  Subjective: She is alert, denies worsening dyspnea. Report mild pain mid chest near site pacemaker.    Objective: Vitals:   12/23/20 0830 12/23/20 0900 12/23/20 1000 12/23/20 1158  BP:  (!) 103/57 (!) 110/52   Pulse:      Resp: 16 20 (!) 23   Temp:    (!) 100.6 F (38.1 C)  TempSrc:    Oral  SpO2: 91% 91% (!) 85%   Weight:      Height:        Intake/Output Summary (Last 24 hours) at 12/23/2020 1202 Last data filed at 12/23/2020 1000 Gross per 24 hour  Intake 397.9 ml  Output 150 ml  Net 247.9 ml   Filed Weights   12/27/2020 1558 12/24/2020 0522  12/23/20 0500  Weight: 56.7 kg 58 kg 65 kg    Examination:  General exam: Appears calm and comfortable  Respiratory system: BL crackles Cardiovascular system: S1 & S2 heard, IRR.  Gastrointestinal system: Abdomen is nondistended, soft and nontender. No  organomegaly or masses felt. Normal bowel sounds heard. Central nervous system: Alert and oriented.  Extremities: Symmetric 5 x 5 power.    Data Reviewed: I have personally reviewed following labs and imaging studies  CBC: Recent Labs  Lab 12/15/2020 1626 12/17/2020 0439 01/02/2021 0130 12/22/20 0125 12/23/20 0132  WBC 13.7* 11.5* 10.2 11.2* 21.9*  NEUTROABS 8.1* 5.8 4.5 6.4 15.5*  HGB 10.5* 10.1* 8.1* 8.5* 9.9*  HCT 32.6* 30.7* 24.6* 26.5* 29.9*  MCV 108.3* 107.7* 107.9* 107.7* 107.9*  PLT 352 374 259 250 [id number]   Basic Metabolic Panel: Recent Labs  Lab 12/09/2020 1626 01/03/2021 0439 12/23/2020 0130 12/22/20 0125 12/23/20 0132  NA 138 137 134* 136 131*  K 4.2 3.9 3.5 4.2 4.6  CL 110 112* 107 103 94*  CO2 16* 14* 21* 24 24  GLUCOSE 96 90 91 151* 100*  BUN 35* 33* 29* 31* 34*  CREATININE 6.23* 5.64* 5.00* 5.40* 5.91*  CALCIUM 9.5 9.0 7.9* 8.5* 9.0  MG 1.7 2.4 2.1 1.9 1.7   GFR: Estimated Creatinine Clearance: 8.1 mL/min (A) (by C-G formula based on SCr of 5.91 mg/dL (H)). Liver Function Tests: Recent Labs  Lab 12/11/2020 1626 12/10/2020 0439 12/29/2020 0130 12/22/20 0125 12/23/20 0132  AST 19 15 12* 20 35  ALT '10 8 8 9 6  '$ ALKPHOS 120 109 91 111 224*  BILITOT 0.6 0.4 0.1* 0.2* 0.3  PROT 6.5 5.9* 4.8* 5.1* 6.1*  ALBUMIN 3.1* 2.6* 2.2* 2.3* 2.5*   No results for input(s): LIPASE, AMYLASE in the last 168 hours. No results for input(s): AMMONIA in the last 168 hours. Coagulation Profile: No results for input(s): INR, PROTIME in the last 168 hours. Cardiac Enzymes: No results for input(s): CKTOTAL, CKMB, CKMBINDEX, TROPONINI in the last 168 hours. BNP (last 3 results) No results for input(s): PROBNP in the last 8760 hours. HbA1C: Recent Labs    12/10/2020 0130  HGBA1C 4.9   CBG: No results for input(s): GLUCAP in the last 168 hours. Lipid Profile: Recent Labs    12/08/2020 0130  CHOL 78  HDL 38*  LDLCALC 30  TRIG 50  CHOLHDL 2.1   Thyroid Function Tests: No  results for input(s): TSH, T4TOTAL, FREET4, T3FREE, THYROIDAB in the last 72 hours. Anemia Panel: Recent Labs    12/18/2020 0200  FERRITIN 396*  TIBC 186*  IRON 39   Sepsis Labs: No results for input(s): PROCALCITON, LATICACIDVEN in the last 168 hours.  Recent Results (from the past 240 hour(s))  Resp Panel by RT-PCR (Flu A&B, Covid) Nasopharyngeal Swab     Status: None   Collection Time: 12/06/2020  8:38 PM   Specimen: Nasopharyngeal Swab; Nasopharyngeal(NP) swabs in vial transport medium  Result Value Ref Range Status   SARS Coronavirus 2 by RT PCR NEGATIVE NEGATIVE Final    Comment: (NOTE) SARS-CoV-2 target nucleic acids are NOT DETECTED.  The SARS-CoV-2 RNA is generally detectable in upper respiratory specimens during the acute phase of infection. The lowest concentration of SARS-CoV-2 viral copies this assay can detect is 138 copies/mL. A negative result does not preclude SARS-Cov-2 infection and should not be used as the sole basis for treatment or other patient management decisions. A negative result may occur with  improper specimen collection/handling,  submission of specimen other than nasopharyngeal swab, presence of viral mutation(s) within the areas targeted by this assay, and inadequate number of viral copies(<138 copies/mL). A negative result must be combined with clinical observations, patient history, and epidemiological information. The expected result is Negative.  Fact Sheet for Patients:  EntrepreneurPulse.com.au  Fact Sheet for Healthcare Providers:  IncredibleEmployment.be  This test is no t yet approved or cleared by the [place] FDA and  has been authorized for detection and/or diagnosis of SARS-CoV-2 by FDA under an Emergency Use Authorization (EUA). This EUA will remain  in effect (meaning this test can be used) for the duration of the COVID-19 declaration under Section 564(b)(1) of the Act, 21 U.S.C.section  360bbb-3(b)(1), unless the authorization is terminated  or revoked sooner.       Influenza A by PCR NEGATIVE NEGATIVE Final   Influenza B by PCR NEGATIVE NEGATIVE Final    Comment: (NOTE) The Xpert Xpress SARS-CoV-2/FLU/RSV plus assay is intended as an aid in the diagnosis of influenza from Nasopharyngeal swab specimens and should not be used as a sole basis for treatment. Nasal washings and aspirates are unacceptable for Xpert Xpress SARS-CoV-2/FLU/RSV testing.  Fact Sheet for Patients: EntrepreneurPulse.com.au  Fact Sheet for Healthcare Providers: IncredibleEmployment.be  This test is not yet approved or cleared by the [place] FDA and has been authorized for detection and/or diagnosis of SARS-CoV-2 by FDA under an Emergency Use Authorization (EUA). This EUA will remain in effect (meaning this test can be used) for the duration of the COVID-19 declaration under Section 564(b)(1) of the Act, 21 U.S.C. section 360bbb-3(b)(1), unless the authorization is terminated or revoked.  Performed at [facility], [place] [address], [place], [place] [address]   MRSA Next Gen by PCR, Nasal     Status: None   Collection Time: 12/05/2020  8:00 AM   Specimen: Nasal Mucosa; Nasal Swab  Result Value Ref Range Status   MRSA by PCR Next Gen NOT DETECTED NOT DETECTED Final    Comment: (NOTE) The GeneXpert MRSA Assay (FDA approved for NASAL specimens only), is one component of a comprehensive MRSA colonization surveillance program. It is not intended to diagnose MRSA infection nor to guide or monitor treatment for MRSA infections. Test performance is not FDA approved in patients less than 74 years old. Performed at [facility], [place] [address], [place], [place] [address]   Surgical PCR screen     Status: Abnormal   Collection Time: 12/10/2020  5:46 PM   Specimen: Nasal Mucosa; Nasal Swab  Result Value Ref Range Status   MRSA, PCR NEGATIVE  NEGATIVE Final   Staphylococcus aureus POSITIVE (A) NEGATIVE Final    Comment: (NOTE) The Xpert SA Assay (FDA approved for NASAL specimens in patients 4 years of age and older), is one component of a comprehensive surveillance program. It is not intended to diagnose infection nor to guide or monitor treatment. Performed at [facility], [place] [address] [place],  [address]          Radiology Studies: DG CHEST PORT 1 VIEW  Result Date: 12/22/2020 CLINICAL DATA:  Pacemaker placement. EXAM: PORTABLE CHEST 1 VIEW COMPARISON:  Chest x-ray from yesterday. FINDINGS: Unchanged left chest wall pacemaker. Stable cardiomediastinal silhouette. Unchanged mild interstitial thickening at the lung bases and probable trace bilateral pleural effusions. Increasing right infrahilar opacity. Unchanged mild left basilar atelectasis. No pneumothorax. No acute osseous abnormality. IMPRESSION: 1. Increasing right infrahilar opacity may reflect worsening atelectasis or  new infiltrate. 2. Unchanged mild interstitial edema and probable trace bilateral pleural effusions. Electronically Signed   By: Titus Dubin M.D.   On: 12/22/2020 10:36   DG CHEST PORT 1 VIEW  Result Date: 12/27/2020 CLINICAL DATA:  Status post pacemaker. EXAM: PORTABLE CHEST 1 VIEW COMPARISON:  Chest x-ray 12/15/2020. FINDINGS: There is a new left-sided dual lead pacemaker in place. There is no evidence for pneumothorax. There are some linear atelectatic changes in the left lung base. The lungs are otherwise clear. There is no pleural effusion identified. Right costophrenic angle has been excluded from the exam. Cardiomediastinal silhouette is within normal limits. IMPRESSION: 1. New left-sided pacemaker in place. 2. No pneumothorax. 3. Left lower lung atelectasis. Electronically Signed   By: Ronney Asters M.D.   On: 12/11/2020 17:00   ECHOCARDIOGRAM LIMITED  Result Date: 12/23/2020    ECHOCARDIOGRAM LIMITED REPORT   Patient  Name:   TIERRIA MARTUCCI Date of Exam: 12/23/2020 Medical Rec #:  [id number]   Height:       63.0 in Accession #:    [id number]  Weight:       143.3 lb Date of Birth:  1950/04/22    BSA:          1.678 m Patient Age:    22 years    BP:           111/89 mmHg Patient Gender: F           HR:           139 bpm. Exam Location:  Inpatient Procedure: Limited Echo, Cardiac Doppler and Color Doppler Indications:    Recent pacemaker placement, concern for pericardial effusion  History:        Patient has prior history of Echocardiogram examinations, most                 recent 12/30/2020. Risk Factors:None.  Sonographer:    Maudry Mayhew MHA, RDMS, RVT, RDCS Referring Phys: [id number] [place]  1. Left ventricular ejection fraction, by estimation, is 60 to 65%. The left ventricle has normal function. The left ventricle has no regional wall motion abnormalities.  2. Right ventricular systolic function is hyperdynamic. The right ventricular size is normal.  3. There is a small circumferential pericardial effusion is present. There is lack of full expansion of the right ventricle; this could be an early sign of increased pericardial pressure. Hyperdynamic septum with no evidence of ventricular interdependence.  4. The mitral valve is normal in structure. No evidence of mitral valve regurgitation. No evidence of mitral stenosis.  5. The aortic valve is normal in structure. Aortic valve regurgitation is not visualized. No aortic stenosis is present.  6. The inferior vena cava is normal in size with greater than 50% respiratory variability, suggesting right atrial pressure of 3 mmHg. Conclusion(s)/Recommendation(s): Due to findings of early increased pericardial pressure, a follow up echocardiogram is recommended in tomorrow. FINDINGS  Left Ventricle: Left ventricular ejection fraction, by estimation, is 60 to 65%. The left ventricle has normal function. The left ventricle has no regional wall motion  abnormalities. The left ventricular internal cavity size was normal in size. There is  no left ventricular hypertrophy. Right Ventricle: There is lack of full expansion of the right ventricle. The right ventricular size is normal. No increase in right ventricular wall thickness. Right ventricular systolic function is hyperdynamic. Left Atrium: Left atrial size was normal in size. Right Atrium: Right atrial size was not well visualized. Pericardium:  A small pericardial effusion is present. The pericardial effusion is circumferential. There is excessive respiratory variation in the mitral valve spectral Doppler velocities and There is lack of full expansion of the right ventricle; this could be an early sign of increased pericardial pressure. Hyperdynamic septum with no evidence of ventricular interdependence. Mitral Valve: The mitral valve is normal in structure. No evidence of mitral valve stenosis. Tricuspid Valve: The tricuspid valve is normal in structure. Tricuspid valve regurgitation is not demonstrated. No evidence of tricuspid stenosis. Aortic Valve: The aortic valve is normal in structure. Aortic valve regurgitation is not visualized. No aortic stenosis is present. Pulmonic Valve: The pulmonic valve was normal in structure. Pulmonic valve regurgitation is not visualized. No evidence of pulmonic stenosis. Aorta: The aortic root is normal in size and structure. Venous: The inferior vena cava is normal in size with greater than 50% respiratory variability, suggesting right atrial pressure of 3 mmHg. IAS/Shunts: No atrial level shunt detected by color flow Doppler. LEFT VENTRICLE PLAX 2D LVIDd:         3.65 cm LVIDs:         2.70 cm LV PW:         0.70 cm LV IVS:        0.60 cm LVOT diam:     2.10 cm LVOT Area:     3.46 cm  LEFT ATRIUM         Index LA diam:    3.45 cm 2.06 cm/m   AORTA Ao Root diam: 2.70 cm  SHUNTS Systemic Diam: 2.10 cm Kardie Tobb DO Electronically signed by Berniece Salines DO Signature  Date/Time: 12/23/2020/11:49:07 AM    Final         Scheduled Meds:  Chlorhexidine Gluconate Cloth  6 each Topical Q0600   diltiazem  120 mg Oral Q8H   mupirocin ointment  1 application Nasal BID   oxyCODONE-acetaminophen  1 tablet Oral Q6H   pravastatin  20 mg Oral Daily   sodium bicarbonate  1,300 mg Oral TID   sodium chloride flush  10-40 mL Intracatheter Q12H   Continuous Infusions:  sodium chloride     amiodarone 30 mg/hr (12/23/20 1000)   magnesium sulfate bolus IVPB     norepinephrine (LEVOPHED) Adult infusion Stopped (12/23/20 0722)     LOS: 4 days    Time spent: 35 minutes    Deema Juncaj A Winnie Umali, MD [facility]   If 7PM-7AM, please contact night-coverage [email or web address]  12/23/2020, 12:02 PM

## 2020-12-23 NOTE — Progress Notes (Signed)
Pharmacy Antibiotic Note  Debra Petersen is a 70 y.o. female admitted on 12/07/2020 with pneumonia.  Pharmacy has been consulted for cefepime dosing.  WBC increase to 21.9, temp 100.6. Scr up to 5.91 (CrCl 8.1 mL/min). S/p St Jude PPM 10/17.   Plan: Cefepime 1g IV every 24 hours Monitor renal fx, cx results, clinical pic, LOT  Height: '5\' 3"'$  (160 cm) Weight: 65 kg (143 lb 4.8 oz) IBW/kg (Calculated) : 52.4  Temp (24hrs), Avg:98.8 F (37.1 C), Min:97.9 F (36.6 C), Max:100.6 F (38.1 C)  Recent Labs  Lab 12/27/2020 1626 12/08/2020 0439 12/18/2020 0130 12/22/20 0125 12/23/20 0132  WBC 13.7* 11.5* 10.2 11.2* 21.9*  CREATININE 6.23* 5.64* 5.00* 5.40* 5.91*    Estimated Creatinine Clearance: 8.1 mL/min (A) (by C-G formula based on SCr of 5.91 mg/dL (H)).    Allergies  Allergen Reactions   Codeine Itching    Antimicrobials this admission: Cefepime 10/19 >>   Dose adjustments this admission: N/A  Microbiology results: 10/19 BCx: sent  10/16 MRSA PCR: neg  Thank you for allowing pharmacy to be a part of this patient's care.  Antonietta Jewel, PharmD, [facility] Clinical Pharmacist  Phone: [phone] 12/23/2020 2:22 PM  Please check AMION for all [place] phone numbers After 10:00 PM, call [place] [phone]

## 2020-12-23 NOTE — Progress Notes (Signed)
Limited 2D echocardiogram completed.  12/23/2020 9:21 AM Kelby Aline., MHA, RVT, RDCS, RDMS

## 2020-12-23 NOTE — Progress Notes (Signed)
Physical Therapy Treatment Patient Details Name: Debra Petersen MRN: [id number] DOB: 1950-03-10 Today's Date: 12/23/2020   History of Present Illness Pt is a 70 y.o. female admitted 12/09/2020 with weakness, palpitations, episodes of near syncope. Found to be in rapid AF, significant pauses. Urgent temporary pacer placed 10/15. Now s/p St Jude PPM placement on 10/17. Course complicated by hypotension overnight 10/19 requiring low vasopressor support, concern for early sepsis. ECHO 10/19 with small to moderate pericardial effusion. PMH includes HTN, CKD IV, aflutter, PAF, atrial tachycardia.   PT Comments    Pt progressing with mobility. Today's session focused on transfer and gait training without DME, pt requiring min guard for balance; pt reports not getting out of bed yet today. Noted tachycardia/afib with mobility; pt also becoming diaphoretic with nausea/vomiting by end of ambulation (RN present; see values below). Pt remains limited by generalized weakness and decreased activity tolerance. Will continue to follow acutely to address established goals.  SpO2 86% on 6L O2 with ambulation; 92% on 5L O2 at rest HR up to 146 with ambulation  Orthostatic BPs Supine 107/61  Sitting 114/69  Standing 94/58  Post-ambulation 130/69      Recommendations for follow up therapy are one component of a multi-disciplinary discharge planning process, led by the attending physician.  Recommendations may be updated based on patient status, additional functional criteria and insurance authorization.  Follow Up Recommendations  No PT follow up;Supervision for mobility/OOB     Equipment Recommendations  None recommended by PT    Recommendations for Other Services       Precautions / Restrictions Precautions Precautions: Fall;ICD/Pacemaker;Other (comment) Precaution Comments: Watch HR, soft BP     Mobility  Bed Mobility Overal bed mobility: Modified Independent             General bed mobility  comments: HOB elevated    Transfers Overall transfer level: Needs assistance Equipment used: None Transfers: Sit to/from Stand Sit to Stand: Min guard         General transfer comment: Multiple sit<>stands from EOB and recliner without DME, min guard for balance, pt with c/o dizziness  Ambulation/Gait Ambulation/Gait assistance: Min guard Gait Distance (Feet): 180 Feet Assistive device: IV Pole Gait Pattern/deviations: Step-through pattern;Decreased stride length Gait velocity: Decreased   General Gait Details: pt declined use of RW; slow, fatigued gait with single UE support on IV pole, close min guard for balance; noted tachycardia, pt becoming diaphoretic with nausea/vomiting upon return to room requiring return to sit, BP 130/69   Stairs             Wheelchair Mobility    Modified Rankin (Stroke Patients Only)       Balance Overall balance assessment: Needs assistance Sitting-balance support: No upper extremity supported;Feet supported Sitting balance-Leahy Scale: Good     Standing balance support: No upper extremity supported;During functional activity Standing balance-Leahy Scale: Fair Standing balance comment: Static and dynamic stability improved with UE support                            Cognition Arousal/Alertness: Awake/alert Behavior During Therapy: WFL for tasks assessed/performed Overall Cognitive Status: Within Functional Limits for tasks assessed                                 General Comments: Question some short-term memory and attention deficits; otherwise [facility] for simple tasks, not formally assessed  Exercises      General Comments General comments (skin integrity, edema, etc.): HR up to 146 with ambulation, noted afib; SpO2 down to 86% on 6L O2. Supine BP 107/61, sitting BP 114/69, standing BP 94/58, post-ambulation BP 130/69. Pt diaphoretic with nausea/vomiting upon return to room; RN present and aware       Pertinent Vitals/Pain Pain Assessment: Faces Faces Pain Scale: Hurts a little bit Pain Location: pacemaker incision Pain Descriptors / Indicators: Sore Pain Intervention(s): Monitored during session    Home Living                      Prior Function            PT Goals (current goals can now be found in the care plan section) Progress towards PT goals: Progressing toward goals    Frequency    Min 3X/week      PT Plan Current plan remains appropriate    Co-evaluation              AM-PAC PT "6 Clicks" Mobility   Outcome Measure  Help needed turning from your back to your side while in a flat bed without using bedrails?: None Help needed moving from lying on your back to sitting on the side of a flat bed without using bedrails?: A Little Help needed moving to and from a bed to a chair (including a wheelchair)?: A Little Help needed standing up from a chair using your arms (e.g., wheelchair or bedside chair)?: A Little Help needed to walk in hospital room?: A Little Help needed climbing 3-5 steps with a railing? : A Little 6 Click Score: 19    End of Session Equipment Utilized During Treatment: Gait belt;Oxygen Activity Tolerance: Patient tolerated treatment well Patient left: in chair;with call bell/phone within reach;with nursing/sitter in room Nurse Communication: Mobility status PT Visit Diagnosis: Other abnormalities of gait and mobility (R26.89)     Time: [id number] PT Time Calculation (min) (ACUTE ONLY): 28 min  Charges:  $Gait Training: 8-22 mins $Therapeutic Activity: 8-22 mins                     Mabeline Caras, PT, DPT Acute Rehabilitation Services  Pager [phone] Office [place] 12/23/2020, 5:16 PM

## 2020-12-23 NOTE — Progress Notes (Signed)
Admit: 12/15/2020 LOS: 4  Debra Petersen [name], hx/o SLE and LN, admit with AF+RVR --> symptomatic bradycardia s/p temp PM.   Subjective:  Remains in RVR, Low BPs overnight req A line and low dose NE gtt SCR up to 5.9, K 4.6, BUN 34, HCO3 24 No N/V. Says PO ok, ate 100% dinner UOP not very much at all Hb 9.9 this AM  10/18 0701 - 10/19 0700 In: 1001.7 [P.O.:540; I.V.:461.7] Out: 125 [Urine:125]  Filed Weights   12/09/2020 1558 12/09/2020 0522 12/23/20 0500  Weight: 56.7 kg 58 kg 65 kg    Scheduled Meds:  Chlorhexidine Gluconate Cloth  6 each Topical Q0600   diltiazem  120 mg Oral Q8H   mupirocin ointment  1 application Nasal BID   oxyCODONE-acetaminophen  1 tablet Oral Q6H   pravastatin  20 mg Oral Daily   sodium bicarbonate  1,300 mg Oral TID   sodium chloride flush  10-40 mL Intracatheter Q12H   Continuous Infusions:  sodium chloride     amiodarone 30 mg/hr (12/23/20 0800)   norepinephrine (LEVOPHED) Adult infusion Stopped (12/23/20 0722)   PRN Meds:.fentaNYL, melatonin, [DISCONTINUED] ondansetron **OR** ondansetron (ZOFRAN) IV, traMADol, zolpidem  Current Labs: reviewed    Physical Exam:  Blood pressure 95/70, pulse 68, temperature 99 F (37.2 C), temperature source Oral, resp. rate 16, height '5\' 3"'$  (1.6 m), weight 65 kg, SpO2 91 %. NAD, wa/wd, conversant Tachy, irriegular R IJ Cordis and PM present NO rub CTAB No LEE Nonfocal  A AoCKD5, [name] follows. [facility] KT eval in process.  Plan for PD as RRT.  Worsening GFR in past 48h prob related to #2. NOt uremic, labs ok, but increasing worry she will req RRT during this admission, patient informed. COnt supportive care, hopefully some reversible portion of this. AF + RVR and symptomatic bradycardia; hypotension, s/p PPM. On Amio, diltiazem, BPs soft on/off NE gtt low dose Metabolic acidosis, improved on PO HCO3 Anemia of CKD on ESA as outpt, TSAT ok.  Hb up in past 24h, CTM, no overt losses  P No RRT needed, but  see above Cont to support hemodynamics, kidneys are responding to hemodynamic issues Daily weights, Daily Renal Panel, Strict I/Os, Avoid nephrotoxins (NSAIDs, judicious IV Contrast)  Medication Issues; Preferred narcotic agents for pain control are hydromorphone, fentanyl, and methadone. Morphine should not be used.  Baclofen should be avoided Avoid oral sodium phosphate and magnesium citrate based laxatives / bowel preps    [name] MD 12/23/2020, 8:39 AM  Recent Labs  Lab 12/27/2020 0130 12/22/20 0125 12/23/20 0132  NA 134* 136 131*  K 3.5 4.2 4.6  CL 107 103 94*  CO2 21* 24 24  GLUCOSE 91 151* 100*  BUN 29* 31* 34*  CREATININE 5.00* 5.40* 5.91*  CALCIUM 7.9* 8.5* 9.0    Recent Labs  Lab 12/15/2020 0130 12/22/20 0125 12/23/20 0132  WBC 10.2 11.2* 21.9*  NEUTROABS 4.5 6.4 15.5*  HGB 8.1* 8.5* 9.9*  HCT 24.6* 26.5* 29.9*  MCV 107.9* 107.7* 107.9*  PLT 259 250 273

## 2020-12-23 NOTE — Consult Note (Signed)
NAME:  Debra Petersen, MRN:  [id number], DOB:  28-Mar-1950, LOS: 4 ADMISSION DATE:  01/03/2021 CONSULTATION DATE:  12/23/2020 REFERRING MD:  Tyrell Antonio - [facility] CHIEF COMPLAINT: Hypotension, possible sepsis   History of Present Illness:  70 year old woman who presented to [facility] 10/15 for weakness and increasing dizziness x 2 days and palpitations. PMHx significant for HTN, HLD, Aflutter (s/p ablation 2010) with paroxysmal Afib (on Rythmol), SLE c/b lupus nephritis (many years ago), CKD stage V (not dialysis-dependent, undergoing transplant eval at [facility]) and hypothyroidism.  In ED, patient was tachycardic to 130s with telemetry demonstrating Afib with RVR. Cardizem gtt was started at slow rate, given patient report of mild bradycardia at home rates 40s-60s). Heparin gtt was initiated for [facility]. Troponin was elevated to 1843, presumed secondary to demand ischemia. Patient was admitted to [facility] with Cardiology/Nephrology consults.   After 4-5 second pause noted on tele 10/15 with variable HR (50s to 130s), concern raised for sick sinus syndrome and EP was consulted, given complex management of antiarrhythmics in the setting of CKD. Underwent temp pacer placement 10/16 with subsequent permanent pacemaker placement 10/17.  On 10/18PM, patient had brief period of hypotension prompting peripheral low-dose pressor initiation. Additionally, Cr worsened with concern for ARF superimposed on CKD. PCCM was consulted for hypotension/ARF with concern for sepsis.  Pertinent Medical History:   Past Medical History:  Diagnosis Date   CKD (chronic kidney disease)    Lupus (systemic lupus erythematosus) (HCC)    Wide-complex tachycardia    probable atrial flutter with 1-1 and 2-1 conduction   Significant Hospital Events: Including procedures, antibiotic start and stop dates in addition to other pertinent events   10/19 - PCCM consulted in the setting of hypotension with possible sepsis, low-dose pressor requirement.  Interim  History / Subjective:  PCCM consulted Patient feels well, laying in bed eating fruit No major issues today Occasional dizziness with standing  Objective:  Blood pressure (!) 110/52, pulse 68, temperature (!) 100.6 F (38.1 C), temperature source Oral, resp. rate (!) 23, height '5\' 3"'$  (1.6 m), weight 65 kg, SpO2 (!) 85 %.        Intake/Output Summary (Last 24 hours) at 12/23/2020 1213 Last data filed at 12/23/2020 1000 Gross per 24 hour  Intake 397.9 ml  Output 150 ml  Net 247.9 ml   Filed Weights   12/27/2020 1558 12/26/2020 0522 12/23/20 0500  Weight: 56.7 kg 58 kg 65 kg   Physical Examination: General: Chronically ill-appearing woman in NAD. HEENT: [place]/AT, anicteric sclera, PERRL, moist mucous membranes. Neuro: Awake, oriented x 4. Responds to verbal stimuli. Following commands consistently. Moves all 4 extremities spontaneously. Strength 5/5 in all 4 extremities. Chest: Pacemaker present. CV: Irregularly irregular rhythm, no m/g/r. PULM: Breathing even and unlabored on 3LNC. Lung fields diminished. GI: Soft, nontender, nondistended. Normoactive bowel sounds. Extremities: No LE edema noted. Skin: Warm/dry, no rashes.  Resolved Hospital Problem List:    Assessment & Plan:  This 70 year old woman is seen in consultation at the request of Dr. Tyrell Antonio [facility]) for recommendations on further evaluation and management of hypotension with concern for developing sepsis.  Hypotension Isolated incident of hypotension 10/18PM prompting peripheral low-dose pressor initiation. No further episodes of hypotension noted; MAP at the time of evaluation 70s with A-line SBP reading 130s. - Goal MAP > 65 - Off of Levophed at present - Continue to monitor pressures via A-line  Concern for developing sepsis Leukocytosis Concern for developing sepsis in the setting of leukocytosis, worsening acute renal failure (  superimposed on CKD V) and transient hypotension. S/p dose of cefazolin x 1. - Trend  CBC, fever curve - Obtain BCx, UCx - No focal symptoms at present/definitive source - Low threshold to start antibiotics if patient decompensates  PCCM will sign off. Please do not hesitate to contact us if patient's clinical status changes or worsens.  Best Practice: (right click and "Reselect all SmartList Selections" daily)   Per Primary Team  Labs:  CBC: Recent Labs  Lab 12/18/2020 1626 12/26/2020 0439 12/12/2020 0130 12/22/20 0125 12/23/20 0132  WBC 13.7* 11.5* 10.2 11.2* 21.9*  NEUTROABS 8.1* 5.8 4.5 6.4 15.5*  HGB 10.5* 10.1* 8.1* 8.5* 9.9*  HCT 32.6* 30.7* 24.6* 26.5* 29.9*  MCV 108.3* 107.7* 107.9* 107.7* 107.9*  PLT 352 374 259 250 [id number]   Basic Metabolic Panel: Recent Labs  Lab 01/04/2021 1626 12/30/2020 0439 01/03/2021 0130 12/22/20 0125 12/23/20 0132  NA 138 137 134* 136 131*  K 4.2 3.9 3.5 4.2 4.6  CL 110 112* 107 103 94*  CO2 16* 14* 21* 24 24  GLUCOSE 96 90 91 151* 100*  BUN 35* 33* 29* 31* 34*  CREATININE 6.23* 5.64* 5.00* 5.40* 5.91*  CALCIUM 9.5 9.0 7.9* 8.5* 9.0  MG 1.7 2.4 2.1 1.9 1.7   GFR: Estimated Creatinine Clearance: 8.1 mL/min (A) (by C-G formula based on SCr of 5.91 mg/dL (H)). Recent Labs  Lab 12/26/2020 0439 12/06/2020 0130 12/22/20 0125 12/23/20 0132  WBC 11.5* 10.2 11.2* 21.9*   Liver Function Tests: Recent Labs  Lab 12/26/2020 1626 12/17/2020 0439 12/25/2020 0130 12/22/20 0125 12/23/20 0132  AST 19 15 12* 20 35  ALT '10 8 8 9 6  '$ ALKPHOS 120 109 91 111 224*  BILITOT 0.6 0.4 0.1* 0.2* 0.3  PROT 6.5 5.9* 4.8* 5.1* 6.1*  ALBUMIN 3.1* 2.6* 2.2* 2.3* 2.5*   No results for input(s): LIPASE, AMYLASE in the last 168 hours. No results for input(s): AMMONIA in the last 168 hours.  ABG:    Component Value Date/Time   HCO3 14.0 (L) 06/23/2020 1235   ACIDBASEDEF 13.2 (H) 06/23/2020 1235   O2SAT 39.0 06/23/2020 1235    Coagulation Profile: No results for input(s): INR, PROTIME in the last 168 hours.  Cardiac Enzymes: No results for  input(s): CKTOTAL, CKMB, CKMBINDEX, TROPONINI in the last 168 hours.  HbA1C: Hgb A1c MFr Bld  Date/Time Value Ref Range Status  01/03/2021 01:30 AM 4.9 4.8 - 5.6 % Final    Comment:    (NOTE) Pre diabetes:          5.7%-6.4%  Diabetes:              >6.4%  Glycemic control for   <7.0% adults with diabetes    CBG: No results for input(s): GLUCAP in the last 168 hours.  Review of Systems:   Review of systems completed with pertinent positives/negatives outlined in above HPI.  Past Medical History:  She,  has a past medical history of CKD (chronic kidney disease), Lupus (systemic lupus erythematosus) ([place]), and Wide-complex tachycardia.   Surgical History:   Past Surgical History:  Procedure Laterality Date   ABDOMINAL HYSTERECTOMY     CARDIAC ELECTROPHYSIOLOGY STUDY AND ABLATION  02/23/2009   of typical and atypical flutter   COLON SURGERY     GALLBLADDER SURGERY     hystoectomy     PACEMAKER IMPLANT N/A 12/06/2020   Procedure: PACEMAKER IMPLANT;  Surgeon: Evans Lance, MD;  Location: [place] [facility];  Service: Cardiovascular;  Laterality: N/A;   SPLENECTOMY, TOTAL     TEMPORARY PACEMAKER N/A 12/26/2020   Procedure: TEMPORARY PACEMAKER;  Surgeon: Martinique, Peter M, MD;  Location: [place] [facility];  Service: Cardiovascular;  Laterality: N/A;    Social History:   reports that she has never smoked. She has never used smokeless tobacco. She reports that she does not currently use alcohol. She reports that she does not use drugs.   Family History:  Her family history includes Coronary artery disease in her father and mother.   Allergies: Allergies  Allergen Reactions   Codeine Itching    Home Medications: Prior to Admission medications   Medication Sig Start Date End Date Taking? Authorizing Provider  acetaminophen (TYLENOL) 500 MG tablet Take 1,000 mg by mouth every 6 (six) hours as needed for mild pain.   Yes [provider]  amLODipine (NORVASC) 5 MG  tablet Take 5 mg by mouth in the morning and at bedtime.   Yes [provider]  calcitRIOL (ROCALTROL) 0.25 MCG capsule Take 1 capsule (0.25 mcg total) by mouth daily. 06/26/20  Yes Florencia Reasons, MD  OVER THE COUNTER MEDICATION Take 1 Scoop by mouth daily. Blueberry supplement   Yes [provider]  OVER THE COUNTER MEDICATION Take 1 Scoop by mouth daily. Kale and spinach supplement   Yes [provider]  pravastatin (PRAVACHOL) 20 MG tablet Take 20 mg by mouth daily.   Yes [provider]  promethazine (PHENERGAN) 25 MG tablet Take 25 mg by mouth every 8 (eight) hours as needed for nausea. 06/23/20  Yes [provider]  propafenone (RYTHMOL) 150 MG tablet TAKE 1 TABLET BY MOUTH TWICE DAILY. Patient taking differently: Take 150 mg by mouth in the morning and at bedtime. 04/14/20  Yes Deboraha Sprang, MD  sodium bicarbonate 650 MG tablet Take 2 tablets (1,300 mg total) by mouth 3 (three) times daily. Patient taking differently: Take 1,300 mg by mouth 4 (four) times daily. 06/25/20  Yes Florencia Reasons, MD  ferrous sulfate 325 (65 FE) MG tablet Take 1 tablet (325 mg total) by mouth daily with breakfast. Patient not taking: No sig reported 06/25/20   Florencia Reasons, MD  Potassium Chloride ER 20 MEQ TBCR Take 20 mEq by mouth daily for 3 days. Patient not taking: Reported on 01/04/2021 06/25/20 06/28/20  Florencia Reasons, MD    Critical care time: 30 minutes   Lestine Mount, PA-C [facility] 12/23/20 12:13 PM  Please see [email or web address] for pager details.  From 7A-7P if no response, please call [phone] After hours, please call ELink [phone]

## 2020-12-23 NOTE — Progress Notes (Addendum)
Electrophysiology Rounding Note  Patient Name: Sharida Jandt Date of Encounter: 12/23/2020  Primary Cardiologist: None Electrophysiologist: Dr. Lovena Le implanting (Dr. Caryl Comes has previously seen)   Subjective   ART line placed overnight with continued AF RVR and hypotension s/p diltiazem dose  She is feeling well this am. Needs to urinate. Did have some lightheadedness overnight with soft pressures. Currently on 2 of levophed  Inpatient Medications    Scheduled Meds:  Chlorhexidine Gluconate Cloth  6 each Topical Q0600   diltiazem  90 mg Oral Q8H   mupirocin ointment  1 application Nasal BID   oxyCODONE-acetaminophen  1 tablet Oral Q6H   pravastatin  20 mg Oral Daily   sodium bicarbonate  1,300 mg Oral TID   sodium chloride flush  10-40 mL Intracatheter Q12H   Continuous Infusions:  sodium chloride     amiodarone 30 mg/hr (12/23/20 0555)   norepinephrine (LEVOPHED) Adult infusion 2 mcg/min (12/23/20 0305)   PRN Meds: fentaNYL, melatonin, [DISCONTINUED] ondansetron **OR** ondansetron (ZOFRAN) IV, traMADol, zolpidem   Vital Signs    Vitals:   12/23/20 0445 12/23/20 0500 12/23/20 0600 12/23/20 0615  BP:      Pulse:      Resp: '11  16 13  '$ Temp:      TempSrc:      SpO2: 95%  91% 91%  Weight:  65 kg    Height:        Intake/Output Summary (Last 24 hours) at 12/23/2020 0709 Last data filed at 12/22/2020 2100 Gross per 24 hour  Intake 1001.67 ml  Output 125 ml  Net 876.67 ml   Filed Weights   12/13/2020 1558 12/31/2020 0522 12/23/20 0500  Weight: 56.7 kg 58 kg 65 kg    Physical Exam    GEN- The patient is well appearing, alert and oriented x 3 today.   Head- normocephalic, atraumatic Eyes-  Sclera clear, conjunctiva pink Ears- hearing intact Oropharynx- clear Neck- supple Lungs- Clear to ausculation bilaterally, normal work of breathing Heart- Tachy and Irregularly irregular rate and rhythm, no murmurs, rubs or gallops GI- soft, NT, ND, + BS Extremities- no  clubbing or cyanosis. No edema Skin- no rash or lesion Psych- euthymic mood, full affect Neuro- strength and sensation are intact  Labs    CBC Recent Labs    12/22/20 0125 12/23/20 0132  WBC 11.2* 21.9*  NEUTROABS 6.4 15.5*  HGB 8.5* 9.9*  HCT 26.5* 29.9*  MCV 107.7* 107.9*  PLT 250 [id number]   Basic Metabolic Panel Recent Labs    12/22/20 0125 12/23/20 0132  NA 136 131*  K 4.2 4.6  CL 103 94*  CO2 24 24  GLUCOSE 151* 100*  BUN 31* 34*  CREATININE 5.40* 5.91*  CALCIUM 8.5* 9.0  MG 1.9 1.7   Liver Function Tests Recent Labs    12/22/20 0125 12/23/20 0132  AST 20 35  ALT 9 6  ALKPHOS 111 224*  BILITOT 0.2* 0.3  PROT 5.1* 6.1*  ALBUMIN 2.3* 2.5*   No results for input(s): LIPASE, AMYLASE in the last 72 hours. Cardiac Enzymes No results for input(s): CKTOTAL, CKMB, CKMBINDEX, TROPONINI in the last 72 hours.   Telemetry    AF RVR 110-130s since 0100 10/18 (personally reviewed)  Radiology    EP PPM/ICD IMPLANT  Result Date: 12/25/2020 CONCLUSIONS:  1. Successful implantation of a St. Jude dual-chamber pacemaker for symptomatic bradycardia due to sinus node dysfunction  2. No early apparent complications.       Cristopher Peru,  MD 12/26/2020 11:41 AM    DG CHEST PORT 1 VIEW  Result Date: 12/22/2020 CLINICAL DATA:  Pacemaker placement. EXAM: PORTABLE CHEST 1 VIEW COMPARISON:  Chest x-ray from yesterday. FINDINGS: Unchanged left chest wall pacemaker. Stable cardiomediastinal silhouette. Unchanged mild interstitial thickening at the lung bases and probable trace bilateral pleural effusions. Increasing right infrahilar opacity. Unchanged mild left basilar atelectasis. No pneumothorax. No acute osseous abnormality. IMPRESSION: 1. Increasing right infrahilar opacity may reflect worsening atelectasis or new infiltrate. 2. Unchanged mild interstitial edema and probable trace bilateral pleural effusions. Electronically Signed   By: Titus Dubin M.D.   On: 12/22/2020 10:36    DG CHEST PORT 1 VIEW  Result Date: 12/25/2020 CLINICAL DATA:  Status post pacemaker. EXAM: PORTABLE CHEST 1 VIEW COMPARISON:  Chest x-ray 12/13/2020. FINDINGS: There is a new left-sided dual lead pacemaker in place. There is no evidence for pneumothorax. There are some linear atelectatic changes in the left lung base. The lungs are otherwise clear. There is no pleural effusion identified. Right costophrenic angle has been excluded from the exam. Cardiomediastinal silhouette is within normal limits. IMPRESSION: 1. New left-sided pacemaker in place. 2. No pneumothorax. 3. Left lower lung atelectasis. Electronically Signed   By: Ronney Asters M.D.   On: 12/24/2020 17:00   ECHOCARDIOGRAM COMPLETE  Result Date: 12/24/2020    ECHOCARDIOGRAM REPORT   Patient Name:   KIANNI SCHNORR Date of Exam: 12/31/2020 Medical Rec #:  [id number]   Height:       63.0 in Accession #:    [id number]  Weight:       127.9 lb Date of Birth:  1950-06-28    BSA:          1.599 m Patient Age:    70 years    BP:           107/86 mmHg Patient Gender: F           HR:           122 bpm. Exam Location:  Inpatient Procedure: 2D Echo, Cardiac Doppler and Color Doppler STAT ECHO Indications:    for Pacemaker today  History:        Patient has no prior history of Echocardiogram examinations.                 Arrythmias:Atrial Fibrillation, Tachycardia and Heart block.  Sonographer:    Merrie Roof RDCS Referring Phys: [id number] [place]  1. Left ventricular ejection fraction, by estimation, is 65 to 70%. The left ventricle has normal function. The left ventricle has no regional wall motion abnormalities. Left ventricular diastolic parameters are consistent with Grade I diastolic dysfunction (impaired relaxation).  2. Right ventricular systolic function is normal. The right ventricular size is normal. There is normal pulmonary artery systolic pressure. The estimated right ventricular systolic pressure is 99991111 mmHg.  3. The  mitral valve is normal in structure. Mild mitral valve regurgitation. No evidence of mitral stenosis.  4. The aortic valve is normal in structure. Aortic valve regurgitation is not visualized. No aortic stenosis is present.  5. The inferior vena cava is dilated in size with >50% respiratory variability, suggesting right atrial pressure of 8 mmHg. FINDINGS  Left Ventricle: Left ventricular ejection fraction, by estimation, is 65 to 70%. The left ventricle has normal function. The left ventricle has no regional wall motion abnormalities. The left ventricular internal cavity size was normal in size. There is  no left ventricular hypertrophy. Left ventricular diastolic parameters  are consistent with Grade I diastolic dysfunction (impaired relaxation). Right Ventricle: The right ventricular size is normal. No increase in right ventricular wall thickness. Right ventricular systolic function is normal. There is normal pulmonary artery systolic pressure. The tricuspid regurgitant velocity is 2.08 m/s, and  with an assumed right atrial pressure of 8 mmHg, the estimated right ventricular systolic pressure is 99991111 mmHg. Left Atrium: Left atrial size was normal in size. Right Atrium: Right atrial size was normal in size. Pericardium: There is no evidence of pericardial effusion. Mitral Valve: The mitral valve is normal in structure. Mild mitral valve regurgitation. No evidence of mitral valve stenosis. Tricuspid Valve: The tricuspid valve is normal in structure. Tricuspid valve regurgitation is mild . No evidence of tricuspid stenosis. Aortic Valve: The aortic valve is normal in structure. Aortic valve regurgitation is not visualized. No aortic stenosis is present. Aortic valve mean gradient measures 3.0 mmHg. Aortic valve peak gradient measures 5.6 mmHg. Aortic valve area, by VTI measures 2.21 cm. Pulmonic Valve: The pulmonic valve was normal in structure. Pulmonic valve regurgitation is not visualized. No evidence of  pulmonic stenosis. Aorta: The aortic root is normal in size and structure. Venous: The inferior vena cava is dilated in size with greater than 50% respiratory variability, suggesting right atrial pressure of 8 mmHg. IAS/Shunts: No atrial level shunt detected by color flow Doppler. Additional Comments: A device lead is visualized in the right ventricle.  LEFT VENTRICLE PLAX 2D LVIDd:         3.70 cm LVIDs:         2.70 cm LV PW:         1.10 cm LV IVS:        1.00 cm LVOT diam:     2.00 cm LV SV:         42 LV SV Index:   26 LVOT Area:     3.14 cm  RIGHT VENTRICLE             IVC RV Basal diam:  2.60 cm     IVC diam: 2.40 cm RV S prime:     14.70 cm/s TAPSE (M-mode): 1.1 cm LEFT ATRIUM             Index        RIGHT ATRIUM           Index LA diam:        3.90 cm 2.44 cm/m   RA Area:     15.00 cm LA Vol (A2C):   56.3 ml 35.21 ml/m  RA Volume:   36.70 ml  22.95 ml/m LA Vol (A4C):   46.8 ml 29.27 ml/m LA Biplane Vol: 54.1 ml 33.84 ml/m  AORTIC VALVE AV Area (Vmax):    2.45 cm AV Area (Vmean):   2.09 cm AV Area (VTI):     2.21 cm AV Vmax:           118.00 cm/s AV Vmean:          84.800 cm/s AV VTI:            0.189 m AV Peak Grad:      5.6 mmHg AV Mean Grad:      3.0 mmHg LVOT Vmax:         92.20 cm/s LVOT Vmean:        56.400 cm/s LVOT VTI:          0.133 m LVOT/AV VTI ratio: 0.70  AORTA Ao Root diam: 2.80 cm  Ao Asc diam:  2.70 cm TRICUSPID VALVE TR Peak grad:   17.3 mmHg TR Vmax:        208.00 cm/s  SHUNTS Systemic VTI:  0.13 m Systemic Diam: 2.00 cm Candee Furbish MD Electronically signed by Candee Furbish MD Signature Date/Time: 12/25/2020/9:33:05 AM    Final     Patient Profile     Vesna Pinera is a 70 y.o. female with a past medical history significant for atrial tachycardia, HTN, syncope, CKD IV, atrial flutter s/p ablation, and paroxysmal atrial fibrillation.  she was admitted for weakness and palpitations with episodes of near syncope. Found to be in rapid AF on arrival to ED. She was started on diltiazem  and noted to have significant pauses with near syncope. Urgent temporary pacer implanted 12/18/2020. EP asked to see for PPM consideration.  Assessment & Plan    Tachy-brady syndrome 2.  Atrial fibrillation with RVR Temp wire placed 01/04/2021 Now s/p DDD St Jude PPM Interrogation post implant with stable device function Wound site remains stable Chest discomfort much improved.  Will discuss transition of amio to po with MD, she currently remains in AF with RVR Continue diltiazem 90 mg q 8 hours for now. May need to adjust.  CHA2DS2/VASc of at least 3. Initial plan was to start [facility] tomorrow.  Will discuss with MD if rate control still sufficient or if we need to consider getting back in NSR prior to 48 hr mark.  With AF RVR, initial chest pain after device implantation, and hypotension, will obtain stat limited echo to r/o pericardial effusion.    3. CKD V Her baseline creatinine is in the 4-5 range. Creatinine trending up. 5.0 -> 5.4 -> 5.9. Renal following.    4. Elevated Troponin  HS-Trop 1843 -> 1658 -> 1457 -> 1294. Thought to be in setting of demand ischemia with CKD V Myoview 2021 without ischemia Echo this admission with normal EF  5. Hypotension In setting of recent NPO status and on-going AF with RVR Required low dose levophed WBC 10.2 -> 11.2 -> 21.9. No overt s/s infection.   For questions or updates, please contact [place] Please consult [email or web address] for contact info under Cardiology/STEMI.  Signed, Shirley Friar, PA-C  12/23/2020, 7:09 AM    I have seen, examined the patient, and reviewed the above assessment and plan.  Changes to above are made where necessary.  On exam, fragile appearing, tachycardic irregular rhythm.  The patient continues to haveafib with V rates elevated.  Medical therapy is limited by low BP.  [place] is on hold per Dr Lovena Le.  Limited echo this am reveals small to moderate effusion without tamponade.  We will repeat echo  tomorrow. If becomes more hypotensive, we may have to urgently cardiovert, though I would like to avoid this until back on anticoagulation.  Dr Lovena Le did reposition her pacemaker leads in the EP lab during her initial implant procedure.  She currently does not have any chest pain or SOB.  Her BP was soft prior to PPM implant as noted on Dr Macky Lower initial consult note.  EP to follow closely with you.  Her prognosis is guarded.  She is at high risk for decompensation, complications, and death.  A high level of decision making was required for this encounter.    Co Sign: Thompson Grayer, MD 12/23/2020 12:23 PM

## 2020-12-23 NOTE — Progress Notes (Signed)
Pt BP has steadily decreased since last dose of PO cardizem. Amio gtt still infusing. Cardiology notified. Pt is A/Ox4 and asymptomatic. Low dose Levo and arterial line ordered. Will implement and notify MD if pt does not improve.   12/23/20 0030  Vitals  Temp 98.5 F (36.9 C)  Temp Source Oral  BP (!) 72/44  MAP (mmHg) (!) 50  ECG Heart Rate (!) 110  Resp 15  Oxygen Therapy  SpO2 93 %  O2 Device Nasal Cannula  O2 Flow Rate (L/min) 3 L/min  Pain Assessment  Pain Scale 0-10  Pain Score 0

## 2020-12-23 NOTE — Procedures (Signed)
Arterial Catheter Insertion Procedure Note  Alexsandria Billett  [id number]  [birth year]  Date:12/23/20  Time:2:20 AM    Provider Performing: Marlowe Aschoff    Procedure: Insertion of Arterial Line [phone]) without US guidance  Indication(s) Blood pressure monitoring and/or need for frequent ABGs  Consent Unable to obtain consent due to emergent nature of procedure.  Anesthesia None   Time Out Verified patient identification, verified procedure, site/side was marked, verified correct patient position, special equipment/implants available, medications/allergies/relevant history reviewed, required imaging and test results available.   Sterile Technique Maximal sterile technique including full sterile barrier drape, hand hygiene, sterile gown, sterile gloves, mask, hair covering, sterile ultrasound probe cover (if used).   Procedure Description Area of catheter insertion was cleaned with chlorhexidine and draped in sterile fashion. Without real-time ultrasound guidance an arterial catheter was placed into the left radial artery.  Appropriate arterial tracings confirmed on monitor.     Complications/Tolerance None; patient tolerated the procedure well.   EBL Minimal   Specimen(s) None

## 2020-12-23 NOTE — Progress Notes (Signed)
RT placed art line into pt. Left radial artery per Dr. Blossom Hoops. Pt. Tolerated the procedure well. No issues. RN is at bedside.

## 2020-12-24 ENCOUNTER — Other Ambulatory Visit (HOSPITAL_COMMUNITY): Payer: Medicare Other

## 2020-12-24 ENCOUNTER — Inpatient Hospital Stay (HOSPITAL_COMMUNITY): Payer: Medicare Other

## 2020-12-24 DIAGNOSIS — I3139 Other pericardial effusion (noninflammatory): Secondary | ICD-10-CM

## 2020-12-24 DIAGNOSIS — I4891 Unspecified atrial fibrillation: Secondary | ICD-10-CM | POA: Diagnosis not present

## 2020-12-24 DIAGNOSIS — J9601 Acute respiratory failure with hypoxia: Secondary | ICD-10-CM

## 2020-12-24 DIAGNOSIS — I959 Hypotension, unspecified: Secondary | ICD-10-CM | POA: Diagnosis not present

## 2020-12-24 DIAGNOSIS — N179 Acute kidney failure, unspecified: Secondary | ICD-10-CM | POA: Diagnosis not present

## 2020-12-24 LAB — CBC WITH DIFFERENTIAL/PLATELET
Abs Immature Granulocytes: 0.11 10*3/uL — ABNORMAL HIGH (ref 0.00–0.07)
Basophils Absolute: 0 10*3/uL (ref 0.0–0.1)
Basophils Relative: 0 %
Eosinophils Absolute: 0.1 10*3/uL (ref 0.0–0.5)
Eosinophils Relative: 0 %
HCT: 26.7 % — ABNORMAL LOW (ref 36.0–46.0)
Hemoglobin: 8.8 g/dL — ABNORMAL LOW (ref 12.0–15.0)
Immature Granulocytes: 1 %
Lymphocytes Relative: 12 %
Lymphs Abs: 1.9 10*3/uL (ref 0.7–4.0)
MCH: 34.8 pg — ABNORMAL HIGH (ref 26.0–34.0)
MCHC: 33 g/dL (ref 30.0–36.0)
MCV: 105.5 fL — ABNORMAL HIGH (ref 80.0–100.0)
Monocytes Absolute: 1.1 10*3/uL — ABNORMAL HIGH (ref 0.1–1.0)
Monocytes Relative: 7 %
Neutro Abs: 12.2 10*3/uL — ABNORMAL HIGH (ref 1.7–7.7)
Neutrophils Relative %: 80 %
Platelets: 258 10*3/uL (ref 150–400)
RBC: 2.53 MIL/uL — ABNORMAL LOW (ref 3.87–5.11)
RDW: 15.3 % (ref 11.5–15.5)
WBC: 15.4 10*3/uL — ABNORMAL HIGH (ref 4.0–10.5)
nRBC: 0.3 % — ABNORMAL HIGH (ref 0.0–0.2)

## 2020-12-24 LAB — COMPREHENSIVE METABOLIC PANEL
ALT: 5 U/L (ref 0–44)
AST: 15 U/L (ref 15–41)
Albumin: 2.1 g/dL — ABNORMAL LOW (ref 3.5–5.0)
Alkaline Phosphatase: 165 U/L — ABNORMAL HIGH (ref 38–126)
Anion gap: 16 — ABNORMAL HIGH (ref 5–15)
BUN: 40 mg/dL — ABNORMAL HIGH (ref 8–23)
CO2: 22 mmol/L (ref 22–32)
Calcium: 8.4 mg/dL — ABNORMAL LOW (ref 8.9–10.3)
Chloride: 87 mmol/L — ABNORMAL LOW (ref 98–111)
Creatinine, Ser: 6.6 mg/dL — ABNORMAL HIGH (ref 0.44–1.00)
GFR, Estimated: 6 mL/min — ABNORMAL LOW (ref >60)
Glucose, Bld: 283 mg/dL — ABNORMAL HIGH (ref 70–99)
Potassium: 4.5 mmol/L (ref 3.5–5.1)
Sodium: 125 mmol/L — ABNORMAL LOW (ref 135–145)
Total Bilirubin: 0.3 mg/dL (ref 0.3–1.2)
Total Protein: 5.4 g/dL — ABNORMAL LOW (ref 6.5–8.1)

## 2020-12-24 LAB — MAGNESIUM: Magnesium: 2.3 mg/dL (ref 1.7–2.4)

## 2020-12-24 LAB — PROCALCITONIN: Procalcitonin: 6.15 ng/mL

## 2020-12-24 LAB — HEMOGLOBIN A1C
Hgb A1c MFr Bld: 5 % (ref 4.8–5.6)
Mean Plasma Glucose: 96.8 mg/dL

## 2020-12-24 LAB — POCT I-STAT 7, (LYTES, BLD GAS, ICA,H+H)
Acid-base deficit: 2 mmol/L (ref 0.0–2.0)
Bicarbonate: 21.2 mmol/L (ref 20.0–28.0)
Calcium, Ion: 0.95 mmol/L — ABNORMAL LOW (ref 1.15–1.40)
HCT: 24 % — ABNORMAL LOW (ref 36.0–46.0)
Hemoglobin: 8.2 g/dL — ABNORMAL LOW (ref 12.0–15.0)
O2 Saturation: 99 %
Patient temperature: 99
Potassium: 4.7 mmol/L (ref 3.5–5.1)
Sodium: 122 mmol/L — ABNORMAL LOW (ref 135–145)
TCO2: 22 mmol/L (ref 22–32)
pCO2 arterial: 27.7 mmHg — ABNORMAL LOW (ref 32.0–48.0)
pH, Arterial: 7.492 — ABNORMAL HIGH (ref 7.350–7.450)
pO2, Arterial: 124 mmHg — ABNORMAL HIGH (ref 83.0–108.0)

## 2020-12-24 LAB — ECHOCARDIOGRAM LIMITED
Height: 63 in
Weight: 2215.18 oz

## 2020-12-24 LAB — BRAIN NATRIURETIC PEPTIDE: B Natriuretic Peptide: 1160.9 pg/mL — ABNORMAL HIGH (ref 0.0–100.0)

## 2020-12-24 LAB — GLUCOSE, CAPILLARY
Glucose-Capillary: 93 mg/dL (ref 70–99)
Glucose-Capillary: 97 mg/dL (ref 70–99)
Glucose-Capillary: 78 mg/dL (ref 70–99)

## 2020-12-24 MED ORDER — FENTANYL CITRATE PF 50 MCG/ML IJ SOSY
25.0000 ug | PREFILLED_SYRINGE | Freq: Once | INTRAMUSCULAR | Status: AC
  Administered 2020-12-24: 25 ug via INTRAVENOUS
  Filled 2020-12-24: qty 1

## 2020-12-24 MED ORDER — AMIODARONE HCL 200 MG PO TABS
400.0000 mg | ORAL_TABLET | Freq: Two times a day (BID) | ORAL | Status: DC
  Administered 2020-12-24 – 2020-12-26 (×6): 400 mg via ORAL
  Filled 2020-12-24 (×6): qty 2

## 2020-12-24 MED ORDER — PERFLUTREN LIPID MICROSPHERE
1.0000 mL | INTRAVENOUS | Status: AC | PRN
Start: 2020-12-24 — End: 2020-12-24
  Filled 2020-12-24: qty 10

## 2020-12-24 MED ORDER — DIPHENHYDRAMINE HCL 50 MG/ML IJ SOLN
25.0000 mg | Freq: Once | INTRAMUSCULAR | Status: AC
  Administered 2020-12-24: 25 mg via INTRAVENOUS
  Filled 2020-12-24: qty 1

## 2020-12-24 MED ORDER — VANCOMYCIN HCL IN DEXTROSE 1-5 GM/200ML-% IV SOLN
1000.0000 mg | Freq: Once | INTRAVENOUS | Status: AC
  Administered 2020-12-24: 1000 mg via INTRAVENOUS
  Filled 2020-12-24: qty 200

## 2020-12-24 MED ORDER — NOREPINEPHRINE 16 MG/250ML-% IV SOLN
0.0000 ug/min | INTRAVENOUS | Status: DC
  Administered 2020-12-24: 10 ug/min via INTRAVENOUS
  Administered 2020-12-26: 4 ug/min via INTRAVENOUS
  Administered 2020-12-30: 11 ug/min via INTRAVENOUS
  Administered 2020-12-31: 18 ug/min via INTRAVENOUS
  Administered 2021-01-01: 20 ug/min via INTRAVENOUS
  Administered 2021-01-01: 40 ug/min via INTRAVENOUS
  Filled 2020-12-24 (×6): qty 250

## 2020-12-24 MED ORDER — AMIODARONE HCL 200 MG PO TABS: 200.0000 mg | ORAL_TABLET | Freq: Every day | ORAL | Status: DC

## 2020-12-24 MED ORDER — AMIODARONE HCL 200 MG PO TABS: 200.0000 mg | ORAL_TABLET | Freq: Two times a day (BID) | ORAL | Status: DC

## 2020-12-24 MED ORDER — PERFLUTREN LIPID MICROSPHERE
INTRAVENOUS | Status: AC
  Filled 2020-12-24: qty 10

## 2020-12-24 MED ORDER — INSULIN ASPART 100 UNIT/ML IJ SOLN
1.0000 [IU] | INTRAMUSCULAR | Status: DC
  Administered 2020-12-30 – 2020-12-31 (×4): 1 [IU] via SUBCUTANEOUS
  Administered 2020-12-31: 2 [IU] via SUBCUTANEOUS

## 2020-12-24 NOTE — Progress Notes (Signed)
NAME:  Debra Petersen, MRN:  [id number], DOB:  10/28/1950, LOS: 5 ADMISSION DATE:  12/09/2020 CONSULTATION DATE:  12/23/2020 REFERRING MD:  Tyrell Antonio - [facility] CHIEF COMPLAINT: Hypotension, possible sepsis   History of Present Illness:  70 year old woman who presented to [facility] 10/15 for weakness and increasing dizziness x 2 days and palpitations. PMHx significant for HTN, HLD, Aflutter (s/p ablation 2010) with paroxysmal Afib (on Rythmol), SLE c/b lupus nephritis (many years ago), CKD stage V (not dialysis-dependent, undergoing transplant eval at [facility]) and hypothyroidism.  In ED, patient was tachycardic to 130s with telemetry demonstrating Afib with RVR. Cardizem gtt was started at slow rate, given patient report of mild bradycardia at home rates 40s-60s). Heparin gtt was initiated for [facility]. Troponin was elevated to 1843, presumed secondary to demand ischemia. Patient was admitted to [facility] with Cardiology/Nephrology consults.   After 4-5 second pause noted on tele 10/15 with variable HR (50s to 130s), concern raised for sick sinus syndrome and EP was consulted, given complex management of antiarrhythmics in the setting of CKD. Underwent temp pacer placement 10/16 with subsequent permanent pacemaker placement 10/17.  On 10/18PM, patient had brief period of hypotension prompting peripheral low-dose pressor initiation. Additionally, Cr worsened with concern for ARF superimposed on CKD. PCCM was consulted for hypotension/ARF with concern for sepsis.  Pertinent Medical History:   Past Medical History:  Diagnosis Date   CKD (chronic kidney disease)    Lupus (systemic lupus erythematosus) (HCC)    Wide-complex tachycardia    probable atrial flutter with 1-1 and 2-1 conduction   Significant Hospital Events: Including procedures, antibiotic start and stop dates in addition to other pertinent events   10/19 - PCCM consulted in the setting of hypotension with possible sepsis, low-dose pressor requirement. ECHO with  new pericardial effusion 10/20 - re consulted for recurrent hypotension req pressors. C/o chest pain. Needs HD cath placed. Broadening abx    Interim History / Subjective:  On levo overnight, weaned off during my exam   C/o chest pain   Objective:  Blood pressure 109/63, pulse (!) 115, temperature 98.6 F (37 C), temperature source Oral, resp. rate 19, height '5\' 3"'$  (1.6 m), weight 62.8 kg, SpO2 (!) 86 %.        Intake/Output Summary (Last 24 hours) at 12/24/2020 1022 Last data filed at 12/24/2020 1000 Gross per 24 hour  Intake 850.74 ml  Output 300 ml  Net 550.74 ml   Filed Weights   12/31/2020 0522 12/23/20 0500 12/24/20 0500  Weight: 58 kg 65 kg 62.8 kg   Physical Examination: General: wdwn acutely ill appearing F, reclined in bed NAD  HEENT: NCAT nasal cannula in place. Anicteric sclera  Neuro: AAOx3 following commands PERRLA CV: irregular rhythm. Heart sounds a little distant -- s1s2. Cap refill brisk  PULM: Very shallow respirations. Unlabored. Crackles in upper lobes, lower lobes are diminished  GI: soft ndnt + bowel sounds  Extremities: no acute abnormalities no cyanosis or clubbing  Skin: pale warm c/d  Resolved Hospital Problem List:    Assessment & Plan:   Acute respiratory failure with Hypoxia -pulmonary edema, small pleural effusions, atelectasis. Possible pna -difficult to take deep breaths with chest pain, has been taking very shallow breaths as such  P -volume removal with HD -Is, flutter, mobility as tolerated  -chest pain / pericardial effusion as below   Shock --etiology unclear -on and off pressors a few times this admit. Most recently seemed to coincide with deep sleep 10/20 and has recently  been weaned off 10/20 AM. There is concern for infection, so septic shock possible. Additionally, there is a new pericardial effusion on 10/19 ECHO  P - MAP goal > 65  -sepsis, pericardial effusion as below  Suspected sepsis  - leukocytosis, elevated PCT -  no clear source right now -- could be CAP, there is pretty limited view of L side wth heart obscuring. R sided opacities probably edema in part, could be PNA.  -concern for ? Pacemaker infection  - febrile x1 on 10/19  P - cont cefepime  - Adding vanc per pharm - trend fever curve, WBC - Bcx pending   Pericardial Effusion -new on ECHO 10/19. ?uremic vs post procedural.  Associated hyperdynamic Rv with incomplete expansion -is c/o chest pain 10/20, worse with inspiration.  P -STAT ECHO   pAFib-- improved rate 10/20  P -cont amio, cont dilt for now  -K goal 4 mag goal 2 -tele monitoring  AKI on CKD V -hx lupus nephritis, undergoing KT eval at [place] -nephro following  -will plan to place HD cath 10/20 -- would like to eval pericardial effusion first (d/w nephro -- HD not emergent)   Hyponatremia -suspect in setting of volume overload P -volume removal per nephro, via HD    Best Practice: (right click and "Reselect all SmartList Selections" daily)   Diet- NPO 10/20  Code Status: Full  VTE ppx: SCD GI ppx: n/a  Goals of care: 10/20 -- nephro, ccm  Lines- n/a. Plan to place HD cath 10/20   Labs:  CBC: Recent Labs  Lab 12/26/2020 0439 12/13/2020 0130 12/22/20 0125 12/23/20 0132 12/24/20 0354  WBC 11.5* 10.2 11.2* 21.9* 15.4*  NEUTROABS 5.8 4.5 6.4 15.5* 12.2*  HGB 10.1* 8.1* 8.5* 9.9* 8.8*  HCT 30.7* 24.6* 26.5* 29.9* 26.7*  MCV 107.7* 107.9* 107.7* 107.9* 105.5*  PLT 374 259 250 273 0000000   Basic Metabolic Panel: Recent Labs  Lab 12/16/2020 0439 12/11/2020 0130 12/22/20 0125 12/23/20 0132 12/24/20 0354  NA 137 134* 136 131* 125*  K 3.9 3.5 4.2 4.6 4.5  CL 112* 107 103 94* 87*  CO2 14* 21* '24 24 22  '$ GLUCOSE 90 91 151* 100* 283*  BUN 33* 29* 31* 34* 40*  CREATININE 5.64* 5.00* 5.40* 5.91* 6.60*  CALCIUM 9.0 7.9* 8.5* 9.0 8.4*  MG 2.4 2.1 1.9 1.7 2.3   GFR: Estimated Creatinine Clearance: 6.7 mL/min (A) (by C-G formula based on SCr of 6.6 mg/dL (H)). Recent  Labs  Lab 12/08/2020 0130 12/22/20 0125 12/23/20 0132 12/23/20 1340 12/23/20 1450 12/24/20 0354  PROCALCITON  --   --   --   --  4.91 6.15  WBC 10.2 11.2* 21.9*  --   --  15.4*  LATICACIDVEN  --   --   --  2.0* 1.1  --    Liver Function Tests: Recent Labs  Lab 12/24/2020 0439 01/03/2021 0130 12/22/20 0125 12/23/20 0132 12/24/20 0354  AST 15 12* 20 35 15  ALT '8 8 9 6 5  '$ ALKPHOS 109 91 111 224* 165*  BILITOT 0.4 0.1* 0.2* 0.3 0.3  PROT 5.9* 4.8* 5.1* 6.1* 5.4*  ALBUMIN 2.6* 2.2* 2.3* 2.5* 2.1*   No results for input(s): LIPASE, AMYLASE in the last 168 hours. No results for input(s): AMMONIA in the last 168 hours.  ABG:    Component Value Date/Time   HCO3 14.0 (L) 06/23/2020 1235   ACIDBASEDEF 13.2 (H) 06/23/2020 1235   O2SAT 39.0 06/23/2020 1235  Coagulation Profile: No results for input(s): INR, PROTIME in the last 168 hours.  Cardiac Enzymes: No results for input(s): CKTOTAL, CKMB, CKMBINDEX, TROPONINI in the last 168 hours.  HbA1C: Hgb A1c MFr Bld  Date/Time Value Ref Range Status  12/25/2020 01:30 AM 4.9 4.8 - 5.6 % Final    Comment:    (NOTE) Pre diabetes:          5.7%-6.4%  Diabetes:              >6.4%  Glycemic control for   <7.0% adults with diabetes    CBG: No results for input(s): GLUCAP in the last 168 hours.  CRITICAL CARE Performed by: Cristal Generous   Total critical care time: 46 minutes  Critical care time was exclusive of separately billable procedures and treating other patients. Critical care was necessary to treat or prevent imminent or life-threatening deterioration.  Critical care was time spent personally by me on the following activities: development of treatment plan with patient and/or surrogate as well as nursing, discussions with consultants, evaluation of patient's response to treatment, examination of patient, obtaining history from patient or surrogate, ordering and performing treatments and interventions, ordering and  review of laboratory studies, ordering and review of radiographic studies, pulse oximetry and re-evaluation of patient's condition.  Eliseo Gum MSN, AGACNP-BC [facility]' \\Amion'$  for pager  12/24/2020, 10:22 AM

## 2020-12-24 NOTE — Plan of Care (Signed)
  Problem: Pain Managment: Goal: General experience of comfort will improve Outcome: Progressing   Problem: Clinical Measurements: Goal: Respiratory complications will improve Outcome: Not Progressing Note: Pt started day on 6L [place] with progressive dyspnea and sats in upper 70's low 80's. Pt placed on NRB   Problem: Nutrition: Goal: Adequate nutrition will be maintained Outcome: Not Progressing   Problem: Elimination: Goal: Will not experience complications related to bowel motility Outcome: Not Progressing Goal: Will not experience complications related to urinary retention Outcome: Not Progressing

## 2020-12-24 NOTE — Progress Notes (Signed)
Patient has required IV pressors overnight. Patient not seen. Discussed with CCM, they will take over patient care. They will call [facility] when patient transfer out ICU.

## 2020-12-24 NOTE — Progress Notes (Signed)
Admit: 01/03/2021 LOS: 5  22F CKD5 (CKA Debra Petersen), hx/o SLE and LN, admit with AF+RVR --> symptomatic bradycardia s/p temp PM.   Subjective:  Still req low dose NE Now on 6L [place] and desaturates when off SCR up to 6.6, K 4.5 SNa 125 Not much UOP More confused  10/19 0701 - 10/20 0700 In: 1239.5 [P.O.:480; I.V.:659.5; IV Piggyback:100] Out: 425 [Urine:425]  Filed Weights   12/11/2020 0522 12/23/20 0500 12/24/20 0500  Weight: 58 kg 65 kg 62.8 kg    Scheduled Meds:  Chlorhexidine Gluconate Cloth  6 each Topical Q0600   diltiazem  120 mg Oral Q8H   mupirocin ointment  1 application Nasal BID   pravastatin  20 mg Oral Daily   sodium bicarbonate  1,300 mg Oral TID   sodium chloride flush  10-40 mL Intracatheter Q12H   Continuous Infusions:  sodium chloride     amiodarone 30 mg/hr (12/24/20 0642)   ceFEPime (MAXIPIME) IV Stopped (12/23/20 1624)   norepinephrine (LEVOPHED) Adult infusion 4 mcg/min (12/24/20 0600)   PRN Meds:.fentaNYL, melatonin, [DISCONTINUED] ondansetron **OR** ondansetron (ZOFRAN) IV, oxyCODONE-acetaminophen, traMADol, zolpidem  Current Labs: reviewed    Physical Exam:  Blood pressure 102/66, pulse (!) 115, temperature 98.6 F (37 C), temperature source Oral, resp. rate 13, height '5\' 3"'$  (1.6 m), weight 62.8 kg, SpO2 (!) 89 %. NAD, conversant Tachy, irriegular NO rub  Coarse bs throughout No LEE Nonfocal  A AoCKD5, [name] follows. [facility] KT eval in process.  Outpt plan for PD as RRT.  With progressive AKi and clinically deteriorating need to start RRT, iHD.  CCM to assist with temp HD cath, then HD today in ICU, 2h, 250/300, 2K, low SNa, attempt 0.5 to 1LUF,  Low dose NE support as needed, d/w RN AF + RVR and symptomatic bradycardia; hypotension, s/p PPM. On Amio, diltiazem, BPs soft on/off NE gtt low dose Metabolic acidosis, stable on PO HCO3 Anemia of CKD on ESA as outpt, TSAT ok.  Hb 8s.  Transfuse as neeed  P As above, start HD today Temp HD cath  with CCM Debra Petersen is now ESRD Daily weights, Daily Renal Panel, Strict I/Os, Avoid nephrotoxins (NSAIDs, judicious IV Contrast)  Medication Issues; Preferred narcotic agents for pain control are hydromorphone, fentanyl, and methadone. Morphine should not be used.  Baclofen should be avoided Avoid oral sodium phosphate and magnesium citrate based laxatives / bowel preps    Debra Grippe MD 12/24/2020, 8:59 AM  Recent Labs  Lab 12/22/20 0125 12/23/20 0132 12/24/20 0354  NA 136 131* 125*  K 4.2 4.6 4.5  CL 103 94* 87*  CO2 '24 24 22  '$ GLUCOSE 151* 100* 283*  BUN 31* 34* 40*  CREATININE 5.40* 5.91* 6.60*  CALCIUM 8.5* 9.0 8.4*    Recent Labs  Lab 12/22/20 0125 12/23/20 0132 12/24/20 0354  WBC 11.2* 21.9* 15.4*  NEUTROABS 6.4 15.5* 12.2*  HGB 8.5* 9.9* 8.8*  HCT 26.5* 29.9* 26.7*  MCV 107.7* 107.9* 105.5*  PLT 250 273 258

## 2020-12-24 NOTE — Progress Notes (Addendum)
Electrophysiology Rounding Note  Patient Name: Debra Petersen Date of Encounter: 12/24/2020  Primary Cardiologist: None Electrophysiologist: Dr. Caryl Comes followed chronically -> Dr. Lovena Le implanted PPM   Subjective   The patient is doing OK this am. Feels "washed out". Denies chest pain. Mild nausea overnight.   Inpatient Medications    Scheduled Meds:  Chlorhexidine Gluconate Cloth  6 each Topical Q0600   diltiazem  120 mg Oral Q8H   mupirocin ointment  1 application Nasal BID   pravastatin  20 mg Oral Daily   sodium bicarbonate  1,300 mg Oral TID   sodium chloride flush  10-40 mL Intracatheter Q12H   Continuous Infusions:  sodium chloride     amiodarone 30 mg/hr (12/24/20 0642)   ceFEPime (MAXIPIME) IV Stopped (12/23/20 1624)   norepinephrine (LEVOPHED) Adult infusion 4 mcg/min (12/24/20 0600)   PRN Meds: fentaNYL, melatonin, [DISCONTINUED] ondansetron **OR** ondansetron (ZOFRAN) IV, oxyCODONE-acetaminophen, traMADol, zolpidem   Vital Signs    Vitals:   12/24/20 0545 12/24/20 0600 12/24/20 0615 12/24/20 0645  BP: (!) 98/53 (!) 94/49 (!) 101/55 102/66  Pulse:      Resp: '16 15 18 13  '$ Temp:      TempSrc:      SpO2: 91% 90% 90% (!) 89%  Weight:      Height:        Intake/Output Summary (Last 24 hours) at 12/24/2020 0730 Last data filed at 12/24/2020 0600 Gross per 24 hour  Intake 1239.52 ml  Output 425 ml  Net 814.52 ml   Filed Weights   12/14/2020 0522 12/23/20 0500 12/24/20 0500  Weight: 58 kg 65 kg 62.8 kg    Physical Exam    GEN- The patient is elderly appearing, alert and oriented x 3 today.   Head- normocephalic, atraumatic Eyes-  Sclera clear, conjunctiva pink Ears- hearing intact Oropharynx- clear Neck- supple Lungs- Clear to ausculation bilaterally, normal work of breathing Heart- Irregularly irregular rate and rhythm, no murmurs, rubs or gallops GI- soft, NT, ND, + BS Extremities- no clubbing or cyanosis. No edema Skin- no rash or  lesion Psych- euthymic mood, full affect Neuro- strength and sensation are intact  Labs    CBC Recent Labs    12/23/20 0132 12/24/20 0354  WBC 21.9* 15.4*  NEUTROABS 15.5* 12.2*  HGB 9.9* 8.8*  HCT 29.9* 26.7*  MCV 107.9* 105.5*  PLT 273 0000000   Basic Metabolic Panel Recent Labs    12/23/20 0132 12/24/20 0354  NA 131* 125*  K 4.6 4.5  CL 94* 87*  CO2 24 22  GLUCOSE 100* 283*  BUN 34* 40*  CREATININE 5.91* 6.60*  CALCIUM 9.0 8.4*  MG 1.7 2.3   Liver Function Tests Recent Labs    12/23/20 0132 12/24/20 0354  AST 35 15  ALT 6 5  ALKPHOS 224* 165*  BILITOT 0.3 0.3  PROT 6.1* 5.4*  ALBUMIN 2.5* 2.1*   No results for input(s): LIPASE, AMYLASE in the last 72 hours. Cardiac Enzymes No results for input(s): CKTOTAL, CKMB, CKMBINDEX, TROPONINI in the last 72 hours.   Telemetry    AF 80-90s this am, 90-110s overnight (personally reviewed)  Radiology    DG CHEST PORT 1 VIEW  Result Date: 12/23/2020 CLINICAL DATA:  Hypoxemia. EXAM: PORTABLE CHEST 1 VIEW COMPARISON:  Chest x-ray 12/22/2020. FINDINGS: Left-sided pacemaker is unchanged in position. The heart is mildly enlarged, unchanged. There are interstitial and patchy perihilar opacities bilaterally, right greater than left. Small pleural effusions are present. There is no  evidence for pneumothorax or acute fracture. IMPRESSION: 1. Cardiomegaly. 2. Perihilar opacities suggestive of moderate pulmonary edema. Infection is not excluded. 3. Small bilateral pleural effusions. Electronically Signed   By: Ronney Asters M.D.   On: 12/23/2020 15:40   DG CHEST PORT 1 VIEW  Result Date: 12/22/2020 CLINICAL DATA:  Pacemaker placement. EXAM: PORTABLE CHEST 1 VIEW COMPARISON:  Chest x-ray from yesterday. FINDINGS: Unchanged left chest wall pacemaker. Stable cardiomediastinal silhouette. Unchanged mild interstitial thickening at the lung bases and probable trace bilateral pleural effusions. Increasing right infrahilar opacity.  Unchanged mild left basilar atelectasis. No pneumothorax. No acute osseous abnormality. IMPRESSION: 1. Increasing right infrahilar opacity may reflect worsening atelectasis or new infiltrate. 2. Unchanged mild interstitial edema and probable trace bilateral pleural effusions. Electronically Signed   By: Titus Dubin M.D.   On: 12/22/2020 10:36   ECHOCARDIOGRAM LIMITED  Result Date: 12/23/2020    ECHOCARDIOGRAM LIMITED REPORT   Patient Name:   Debra Petersen Date of Exam: 12/23/2020 Medical Rec #:  [id number]   Height:       63.0 in Accession #:    [id number]  Weight:       143.3 lb Date of Birth:  Dec 19, 1950    BSA:          1.678 m Patient Age:    72 years    BP:           111/89 mmHg Patient Gender: F           HR:           139 bpm. Exam Location:  Inpatient Procedure: Limited Echo, Cardiac Doppler and Color Doppler Indications:    Recent pacemaker placement, concern for pericardial effusion  History:        Patient has prior history of Echocardiogram examinations, most                 recent 12/24/2020. Risk Factors:None.  Sonographer:    Maudry Mayhew MHA, RDMS, RVT, RDCS Referring Phys: [id number] [place]  1. Left ventricular ejection fraction, by estimation, is 60 to 65%. The left ventricle has normal function. The left ventricle has no regional wall motion abnormalities.  2. Right ventricular systolic function is hyperdynamic. The right ventricular size is normal.  3. There is a small circumferential pericardial effusion is present. There is lack of full expansion of the right ventricle; this could be an early sign of increased pericardial pressure. Hyperdynamic septum with no evidence of ventricular interdependence.  4. The mitral valve is normal in structure. No evidence of mitral valve regurgitation. No evidence of mitral stenosis.  5. The aortic valve is normal in structure. Aortic valve regurgitation is not visualized. No aortic stenosis is present.  6. The inferior vena  cava is normal in size with greater than 50% respiratory variability, suggesting right atrial pressure of 3 mmHg. Conclusion(s)/Recommendation(s): Due to findings of early increased pericardial pressure, a follow up echocardiogram is recommended in tomorrow. FINDINGS  Left Ventricle: Left ventricular ejection fraction, by estimation, is 60 to 65%. The left ventricle has normal function. The left ventricle has no regional wall motion abnormalities. The left ventricular internal cavity size was normal in size. There is  no left ventricular hypertrophy. Right Ventricle: There is lack of full expansion of the right ventricle. The right ventricular size is normal. No increase in right ventricular wall thickness. Right ventricular systolic function is hyperdynamic. Left Atrium: Left atrial size was normal in size. Right Atrium: Right atrial  size was not well visualized. Pericardium: A small pericardial effusion is present. The pericardial effusion is circumferential. There is excessive respiratory variation in the mitral valve spectral Doppler velocities and There is lack of full expansion of the right ventricle; this could be an early sign of increased pericardial pressure. Hyperdynamic septum with no evidence of ventricular interdependence. Mitral Valve: The mitral valve is normal in structure. No evidence of mitral valve stenosis. Tricuspid Valve: The tricuspid valve is normal in structure. Tricuspid valve regurgitation is not demonstrated. No evidence of tricuspid stenosis. Aortic Valve: The aortic valve is normal in structure. Aortic valve regurgitation is not visualized. No aortic stenosis is present. Pulmonic Valve: The pulmonic valve was normal in structure. Pulmonic valve regurgitation is not visualized. No evidence of pulmonic stenosis. Aorta: The aortic root is normal in size and structure. Venous: The inferior vena cava is normal in size with greater than 50% respiratory variability, suggesting right atrial  pressure of 3 mmHg. IAS/Shunts: No atrial level shunt detected by color flow Doppler. LEFT VENTRICLE PLAX 2D LVIDd:         3.65 cm LVIDs:         2.70 cm LV PW:         0.70 cm LV IVS:        0.60 cm LVOT diam:     2.10 cm LVOT Area:     3.46 cm  LEFT ATRIUM         Index LA diam:    3.45 cm 2.06 cm/m   AORTA Ao Root diam: 2.70 cm  SHUNTS Systemic Diam: 2.10 cm Kardie Tobb DO Electronically signed by Berniece Salines DO Signature Date/Time: 12/23/2020/11:49:07 AM    Final     Patient Profile     Tynetta Menz is a 70 y.o. female with a past medical history significant for atrial tachycardia, HTN, syncope, CKD IV, atrial flutter s/p ablation, and paroxysmal atrial fibrillation.  she was admitted for weakness and palpitations with episodes of near syncope. Found to be in rapid AF on arrival to ED. She was started on diltiazem and noted to have significant pauses with near syncope. Urgent temporary pacer implanted 12/11/2020. EP asked to see for PPM consideration.  Assessment & Plan    Tachy-brady syndrome 2.  Atrial fibrillation with RVR Temp wire placed 12/28/2020 Now s/p DDD St Jude PPM Interrogation post implant with stable device function Wound site remains stable. Chest discomfort resolved. Repeat limited echo pending.  If Echo stable may be able to transition to po amio, if we have to revise, may increase irritability and may need to continue IV amio for now.  Continue diltiazem 120 mg q 8 hours. Consider consolidation to daily on discharge.  CHA2DS2/VASc of at least 3. Plan to start [facility] this evening if stable through day/echo unremarkable.   3. Pericardial effusion Limited echo 10/19 with mild to moderate effusion with no overt tamponade but incomplete RV emptying.    4. CKD V Her baseline creatinine is in the 4-5 range. Creatinine trending up. 5.0 -> 5.4 -> 5.9 -> 6.60. Renal following   5. Hypotension ART line in place with systolics hovering around 90. Pressure cuff ~100 Back on 3 of  NE this am.  WBC 10.2 -> 11.2 -> 21.9 -> 15.4 Covering with Cefepime.   She had clear breakfast and then will be NPO in the event she needs a procedure today. Prognosis remains guarded  For questions or updates, please contact [place] Please consult [email or web address]  for contact info under Cardiology/STEMI.  Signed, Shirley Friar, PA-C  12/24/2020, 7:30 AM    I have seen, examined the patient, and reviewed the above assessment and plan.  Changes to above are made where necessary.  On exam, iRRR.  The patient remains quite ill.  This appears to be multifactorial.  Her V rates are better controlled with amiodarone.  Ok to start [facility].  Given advanced renal failure,  may be best to use coumadin for now. Repeat echo reveals that effusion is small and not hemodynamically significant.   Co Sign: Thompson Grayer, MD 12/24/2020 1:05 PM

## 2020-12-24 NOTE — Progress Notes (Signed)
Pharmacy Antibiotic Note  Debra Petersen is a 70 y.o. female admitted on 12/27/2020 with pneumonia.  Pharmacy has been consulted for cefepime dosing.  Asked to add vancomycin today for MRSA PCR+, and suspected sepsis.  WBC 21.9 > 15.4, now afebrile. Scr up to 6.6 (CrCl 8.1 mL/min). S/p St Jude PPM 10/17.   Plan: Continue Cefepime 1g IV every 24 hours Vancomycin 1g x 1 now, then will give 750 mg q HD if tolerates at least ~ 3-3.5 hrs of HD. Monitor renal fx, cx results, clinical pic, LOT  Height: '5\' 3"'$  (160 cm) Weight: 62.8 kg (138 lb 7.2 oz) IBW/kg (Calculated) : 52.4  Temp (24hrs), Avg:99.2 F (37.3 C), Min:98.6 F (37 C), Max:100.6 F (38.1 C)  Recent Labs  Lab 12/24/2020 0439 01/03/2021 0130 12/22/20 0125 12/23/20 0132 12/23/20 1340 12/23/20 1450 12/24/20 0354  WBC 11.5* 10.2 11.2* 21.9*  --   --  15.4*  CREATININE 5.64* 5.00* 5.40* 5.91*  --   --  6.60*  LATICACIDVEN  --   --   --   --  2.0* 1.1  --      Estimated Creatinine Clearance: 6.7 mL/min (A) (by C-G formula based on SCr of 6.6 mg/dL (H)).    Allergies  Allergen Reactions   Codeine Itching    Antimicrobials this admission: Cefepime 10/19 >>  Vancomycin 10/20>>  Dose adjustments this admission: N/A  Microbiology results: 10/19 BCx: sent  10/16 MRSA PCR: neg 10/16 Surgical PCR MRSA +  Thank you for allowing pharmacy to be a part of this patient's care.  [place] Crane, Roylene Reason, [facility] Clinical Pharmacist  12/24/2020 11:26 AM   [facility] pharmacy phone numbers are listed on [email or web address]

## 2020-12-24 NOTE — Procedures (Signed)
Central Venous Catheter Insertion Procedure Note  Debra Petersen  [id number]  [birth year]  Date:12/24/20  Time:3:58 PM   Provider Performing:Debra Petersen   Procedure: Insertion of Non-tunneled Central Venous Catheter(36556)with US guidance [id number])    Indication(s) Hemodialysis  Consent Risks of the procedure as well as the alternatives and risks of each were explained to the patient and/or caregiver.  Consent for the procedure was obtained and is signed in the bedside chart  Anesthesia Topical only with 1% lidocaine   Timeout Verified patient identification, verified procedure, site/side was marked, verified correct patient position, special equipment/implants available, medications/allergies/relevant history reviewed, required imaging and test results available.  Sterile Technique Maximal sterile technique including full sterile barrier drape, hand hygiene, sterile gown, sterile gloves, mask, hair covering, sterile ultrasound probe cover (if used).  Procedure Description Area of catheter insertion was cleaned with chlorhexidine and draped in sterile fashion.   With real-time ultrasound guidance a HD catheter was placed into the right internal jugular vein.  Nonpulsatile blood flow and easy flushing noted in all ports.  The catheter was sutured in place and sterile dressing applied.  Complications/Tolerance None; patient tolerated the procedure well. Chest X-ray is ordered to verify placement for internal jugular or subclavian cannulation.  Chest x-ray is not ordered for femoral cannulation.  EBL Minimal  Specimen(s) None   Lawerance Cruel, D.O.  Internal Medicine Resident, PGY-3 [facility]  Pager: [phone] 3:59 PM, 12/24/2020

## 2020-12-24 NOTE — Progress Notes (Signed)
Clinical updates provided to pt son via phone. All questions answered  Eliseo Gum MSN, AGACNP-BC [place]

## 2020-12-24 NOTE — Progress Notes (Signed)
  Discussed repeat echo with pt and son.   Showed small circumferential pericardial effusion that has not worsened in size and currently does not show signs of tamponade.   Would start coumadin when stable per CCU with procedure considerations.   EP will follow at a distance.   Rates currently controlled on diltiazem 120 mg q 8. Can continue or consider consolidating to 360 mg daily qhs (long acting) at discharge.   Transition to po amiodarone 400 mg BID x 1 week, then 200 mg BID x 1 week, then 200 mg daily.   If remains stable can see as outpatient around the 3 week mark to consider cardioversion.   Legrand Como [address]" [place], PA-C  12/24/2020 1:06 PM

## 2020-12-25 ENCOUNTER — Other Ambulatory Visit (HOSPITAL_COMMUNITY): Payer: Self-pay

## 2020-12-25 DIAGNOSIS — A419 Sepsis, unspecified organism: Secondary | ICD-10-CM

## 2020-12-25 DIAGNOSIS — R6521 Severe sepsis with septic shock: Secondary | ICD-10-CM

## 2020-12-25 LAB — BASIC METABOLIC PANEL
Anion gap: 15 (ref 5–15)
BUN: 56 mg/dL — ABNORMAL HIGH (ref 8–23)
CO2: 22 mmol/L (ref 22–32)
Calcium: 8.9 mg/dL (ref 8.9–10.3)
Chloride: 91 mmol/L — ABNORMAL LOW (ref 98–111)
Creatinine, Ser: 7.76 mg/dL — ABNORMAL HIGH (ref 0.44–1.00)
GFR, Estimated: 5 mL/min — ABNORMAL LOW (ref >60)
Glucose, Bld: 84 mg/dL (ref 70–99)
Potassium: 5.3 mmol/L — ABNORMAL HIGH (ref 3.5–5.1)
Sodium: 128 mmol/L — ABNORMAL LOW (ref 135–145)

## 2020-12-25 LAB — CBC
HCT: 25.9 % — ABNORMAL LOW (ref 36.0–46.0)
Hemoglobin: 8.8 g/dL — ABNORMAL LOW (ref 12.0–15.0)
MCH: 34.9 pg — ABNORMAL HIGH (ref 26.0–34.0)
MCHC: 34 g/dL (ref 30.0–36.0)
MCV: 102.8 fL — ABNORMAL HIGH (ref 80.0–100.0)
Platelets: 283 10*3/uL (ref 150–400)
RBC: 2.52 MIL/uL — ABNORMAL LOW (ref 3.87–5.11)
RDW: 15.1 % (ref 11.5–15.5)
WBC: 17.2 10*3/uL — ABNORMAL HIGH (ref 4.0–10.5)
nRBC: 0.2 % (ref 0.0–0.2)

## 2020-12-25 LAB — GLUCOSE, CAPILLARY
Glucose-Capillary: 108 mg/dL — ABNORMAL HIGH (ref 70–99)
Glucose-Capillary: 109 mg/dL — ABNORMAL HIGH (ref 70–99)
Glucose-Capillary: 112 mg/dL — ABNORMAL HIGH (ref 70–99)
Glucose-Capillary: 66 mg/dL — ABNORMAL LOW (ref 70–99)
Glucose-Capillary: 96 mg/dL (ref 70–99)
Glucose-Capillary: 97 mg/dL (ref 70–99)
Glucose-Capillary: 98 mg/dL (ref 70–99)

## 2020-12-25 LAB — PROCALCITONIN: Procalcitonin: 17.19 ng/mL

## 2020-12-25 LAB — HEPATITIS B SURFACE ANTIBODY,QUALITATIVE: Hep B S Ab: NONREACTIVE

## 2020-12-25 LAB — HEPATITIS B SURFACE ANTIGEN: Hepatitis B Surface Ag: NONREACTIVE

## 2020-12-25 MED ORDER — SODIUM ZIRCONIUM CYCLOSILICATE 10 G PO PACK
10.0000 g | PACK | Freq: Once | ORAL | Status: AC
  Administered 2020-12-25: 10 g
  Filled 2020-12-25: qty 1

## 2020-12-25 MED ORDER — PHENYLEPHRINE HCL-NACL 20-0.9 MG/250ML-% IV SOLN
0.0000 ug/min | INTRAVENOUS | Status: DC
  Filled 2020-12-25: qty 250

## 2020-12-25 MED ORDER — MAGNESIUM SULFATE 2 GM/50ML IV SOLN
2.0000 g | Freq: Once | INTRAVENOUS | Status: AC
  Administered 2020-12-25: 2 g via INTRAVENOUS
  Filled 2020-12-25: qty 50

## 2020-12-25 MED ORDER — SODIUM CHLORIDE 0.9 % IV SOLN: 100.0000 mL | INTRAVENOUS | Status: DC | PRN

## 2020-12-25 MED ORDER — CALCIUM GLUCONATE-NACL 2-0.675 GM/100ML-% IV SOLN
2.0000 g | Freq: Once | INTRAVENOUS | Status: AC
  Administered 2020-12-25: 2000 mg via INTRAVENOUS
  Filled 2020-12-25: qty 100

## 2020-12-25 MED ORDER — VANCOMYCIN HCL 750 MG/150ML IV SOLN
750.0000 mg | Freq: Once | INTRAVENOUS | Status: AC
  Administered 2020-12-25: 750 mg via INTRAVENOUS
  Filled 2020-12-25: qty 150

## 2020-12-25 MED ORDER — WARFARIN - PHARMACIST DOSING INPATIENT: Freq: Every day | Status: DC

## 2020-12-25 MED ORDER — FUROSEMIDE 10 MG/ML IJ SOLN
160.0000 mg | Freq: Three times a day (TID) | INTRAVENOUS | Status: DC
  Administered 2020-12-25 (×3): 160 mg via INTRAVENOUS
  Filled 2020-12-25: qty 16
  Filled 2020-12-25: qty 2
  Filled 2020-12-25: qty 16
  Filled 2020-12-25: qty 4
  Filled 2020-12-25 (×2): qty 16

## 2020-12-25 MED ORDER — CHLORHEXIDINE GLUCONATE CLOTH 2 % EX PADS
6.0000 | MEDICATED_PAD | Freq: Every day | CUTANEOUS | Status: DC
  Administered 2020-12-26: 6 via TOPICAL

## 2020-12-25 MED ORDER — VASOPRESSIN 20 UNITS/100 ML INFUSION FOR SHOCK
0.0000 [IU]/min | INTRAVENOUS | Status: DC
  Administered 2020-12-25: 0.03 [IU]/min via INTRAVENOUS
  Filled 2020-12-25: qty 100

## 2020-12-25 MED ORDER — WARFARIN SODIUM 5 MG PO TABS
5.0000 mg | ORAL_TABLET | Freq: Once | ORAL | Status: AC
  Administered 2020-12-25: 5 mg via ORAL
  Filled 2020-12-25: qty 1

## 2020-12-25 NOTE — Progress Notes (Signed)
Physical Therapy Treatment Patient Details Name: Debra Petersen MRN: [id number] DOB: Feb 07, 1951 Today's Date: 12/25/2020   History of Present Illness Pt is a 70 y.o. female admitted 01/03/2021 with weakness, palpitations, episodes of near syncope. Found to be in rapid AF, significant pauses. Urgent temporary pacer placed 10/15. Now s/p St Jude PPM placement on 10/17. Course complicated by hypotension overnight 10/19 requiring low vasopressor support, concern for early sepsis and shock. ECHO 10/19 with small to moderate pericardial effusion. RIJ temp HD cath placed; HD initiated 10/21. Pt with hypoxia requiring NRB 10/21. PMH includes HTN, CKD IV, aflutter, PAF, atrial tachycardia.   PT Comments    Pt with worsening medical status since prior session, now on 15+L O2 via NRB. Today's session focused on brief bouts of standing activity, pt requiring minA and HHA for mobility. Pt remains limited by generalized weakness, decreased activity tolerance, and impaired balance strategies/postural reactions. Pt following simple commands appropriately, but demonstrates increased confusion. Pt's son present and reports he could stay with pt at d/c if needed to provide increased assist. Discharge recommendations updated to [facility] services.  SpO2 down to 81% on NRB with activity, recovering to >/92% with seated rest    Recommendations for follow up therapy are one component of a multi-disciplinary discharge planning process, led by the attending physician.  Recommendations may be updated based on patient status, additional functional criteria and insurance authorization.  Follow Up Recommendations  Supervision for mobility/OOB;Home health PT     Equipment Recommendations   (TBD - rollator, [facility]?)    Recommendations for Other Services       Precautions / Restrictions Precautions Precautions: Fall;ICD/Pacemaker;Other (comment) Precaution Comments: Watch vitals; on NRB     Mobility  Bed Mobility Overal bed  mobility: Modified Independent             General bed mobility comments: HOB elevated    Transfers Overall transfer level: Needs assistance Equipment used: 1 person hand held assist Transfers: Sit to/from Stand Sit to Stand: Min assist         General transfer comment: Able to stand from EOB, recliner and BSC with minA for trunk elevation and HHA for stability  Ambulation/Gait Ambulation/Gait assistance: Min assist Gait Distance (Feet): 2 Feet Assistive device: 1 person hand held assist Gait Pattern/deviations: Step-to pattern;Trunk flexed;Leaning posteriorly Gait velocity: Decreased   General Gait Details: Pivotal steps from bed>recliner>BSC>recliner with minA for HHA and stability; short, shuffling, unsteady gait; further mobility deferred secondary to respiratory status   Stairs             Wheelchair Mobility    Modified Rankin (Stroke Patients Only)       Balance Overall balance assessment: Needs assistance Sitting-balance support: No upper extremity supported;Feet supported Sitting balance-Leahy Scale: Fair Sitting balance - Comments: able to perform pericare sitting on BSC     Standing balance-Leahy Scale: Poor Standing balance comment: Reliant on HHA and/or external assist for standing balance                            Cognition Arousal/Alertness: Awake/alert Behavior During Therapy: WFL for tasks assessed/performed Overall Cognitive Status: Impaired/Different from baseline                                 General Comments: "Do you see those people on the tv... are there people up there." Pt following simple commands appropriately,  does not recall prior PT session; oriented, but has some confusion. Able to state son's name      Exercises      General Comments General comments (skin integrity, edema, etc.): Pt briefly removing NRB to cough, SpO2 down to 77% on RA, up to 95% when replaced; SpO2 down to 81% on 15+L  O2 via NRB with activity      Pertinent Vitals/Pain Pain Assessment: Faces Faces Pain Scale: Hurts a little bit Pain Location: generalized Pain Descriptors / Indicators: Tiring Pain Intervention(s): Monitored during session    Home Living                      Prior Function            PT Goals (current goals can now be found in the care plan section) Acute Rehab PT Goals Patient Stated Goal: Return home; son able to provide increased assist if needed PT Goal Formulation: With patient/family Progress towards PT goals: Progressing toward goals    Frequency    Min 3X/week      PT Plan Current plan remains appropriate    Co-evaluation              AM-PAC PT "6 Clicks" Mobility   Outcome Measure  Help needed turning from your back to your side while in a flat bed without using bedrails?: None Help needed moving from lying on your back to sitting on the side of a flat bed without using bedrails?: A Little Help needed moving to and from a bed to a chair (including a wheelchair)?: A Little Help needed standing up from a chair using your arms (e.g., wheelchair or bedside chair)?: A Little Help needed to walk in hospital room?: A Little Help needed climbing 3-5 steps with a railing? : A Little 6 Click Score: 19    End of Session Equipment Utilized During Treatment: Oxygen Activity Tolerance: Patient tolerated treatment well;Patient limited by fatigue;Treatment limited secondary to medical complications (Comment) Patient left: in chair;with call bell/phone within reach;with family/visitor present Nurse Communication: Mobility status PT Visit Diagnosis: Other abnormalities of gait and mobility (R26.89)     Time: [id number] PT Time Calculation (min) (ACUTE ONLY): 20 min  Charges:  $Therapeutic Activity: 8-22 mins                     Mabeline Caras, PT, DPT Acute Rehabilitation Services  Pager [phone] Office [place] 12/25/2020, 5:48 PM

## 2020-12-25 NOTE — Evaluation (Signed)
Clinical/Bedside Swallow Evaluation Patient Details  Name: Debra Petersen MRN: [id number] Date of Birth: 24-Oct-1950  Today's Date: 12/25/2020 Time: SLP Start Time (ACUTE ONLY): 1045 SLP Stop Time (ACUTE ONLY): 1100 SLP Time Calculation (min) (ACUTE ONLY): 15 min  Past Medical History:  Past Medical History:  Diagnosis Date   CKD (chronic kidney disease)    Lupus (systemic lupus erythematosus) (HCC)    Wide-complex tachycardia    probable atrial flutter with 1-1 and 2-1 conduction   Past Surgical History:  Past Surgical History:  Procedure Laterality Date   ABDOMINAL HYSTERECTOMY     CARDIAC ELECTROPHYSIOLOGY STUDY AND ABLATION  02/23/2009   of typical and atypical flutter   COLON SURGERY     GALLBLADDER SURGERY     hystoectomy     PACEMAKER IMPLANT N/A 12/25/2020   Procedure: PACEMAKER IMPLANT;  Surgeon: Evans Lance, MD;  Location: [facility];  Service: Cardiovascular;  Laterality: N/A;   SPLENECTOMY, TOTAL     TEMPORARY PACEMAKER N/A 12/31/2020   Procedure: TEMPORARY PACEMAKER;  Surgeon: Martinique, Peter M, MD;  Location: [facility];  Service: Cardiovascular;  Laterality: N/A;   HPI:  Debra Petersen is a 70 year old woman with a history of atrial flutter with previous ablation in 2010, paroxysmal atrial fibrillation, 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  She underwent pacemaker implantation due to concern for Sick Sinus Syndrome with prolonged sinus pauses.  She developed hypotension due to pneumonia and was on vasopressors.    Assessment / Plan / Recommendation  Clinical Impression  Pt demonstrates no immediate sign of aspiraiton with sips of thin liquids, though RN has noticed her to cough with thins and also with consecutive sipping. Pt on 15 L on NRB at time of SLP assessment with immediate desaturation with brief removal of mask for PO. She is observed to take very small sips and refuses to more than nibble  her food due to fear of choking. She reports no prior history of dysphagia, but her behavior suggests otherwise.  Given her respiratory impiarment, difficulty coordinating swallow and breathing is probable, particularly with larger sips. Pt would not tolerate MBS with current respiratory status. Recommend nectar thick liquids and regular solids over the weekend until respiratory function more stable for swallow assessment. Small sips, pills whole in puree, upright positioning all important. SLP Visit Diagnosis: Dysphagia, unspecified (R13.10)    Aspiration Risk  Moderate aspiration risk    Diet Recommendation Regular;Nectar-thick liquid   Liquid Administration via: Cup;Straw Medication Administration: Whole meds with puree Compensations: Slow rate;Small sips/bites    Other  Recommendations      Recommendations for follow up therapy are one component of a multi-disciplinary discharge planning process, led by the attending physician.  Recommendations may be updated based on patient status, additional functional criteria and insurance authorization.  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  She underwent pacemaker implantation due to concern for Sick Sinus Syndrome with prolonged sinus pauses.  She developed hypotension due to pneumonia and was on vasopressors. Type of Study: Bedside  Swallow Evaluation Previous Swallow Assessment: none Diet Prior to this Study: Regular;Thin liquids Temperature Spikes Noted: No Respiratory Status: Non-rebreather History of Recent Intubation: No Behavior/Cognition: Alert;Cooperative Oral Cavity Assessment: Within  Functional Limits Oral Care Completed by SLP: No Oral Cavity - Dentition: Adequate natural dentition Vision: Functional for self-feeding Self-Feeding Abilities: Able to feed self Patient Positioning: Upright in bed Baseline Vocal Quality: Normal Volitional Cough: Strong Volitional Swallow: Able to elicit    Oral/Motor/Sensory Function Overall Oral Motor/Sensory Function: Within functional limits   Ice Chips     Thin Liquid Thin Liquid: Within functional limits    Nectar Thick Nectar Thick Liquid: Within functional limits   Honey Thick Honey Thick Liquid: Not tested   Puree Puree: Within functional limits   Solid     Solid: Impaired Other Comments: took very small nibbles     Herbie Baltimore, MA [place] [phone]  Lynann Beaver 12/25/2020,12:23 PM

## 2020-12-25 NOTE — Progress Notes (Signed)
Admit: 01/02/2021 LOS: 6  35F CKD5 (CKA Debra Petersen), hx/o SLE and LN, admit with AF+RVR --> symptomatic bradycardia s/p temp PM.   Subjective:  Still req low dose NE HD just finished earlier this AM, 0.3L UF Using Temp R IJ HD cath placed by CCM On HF   10/20 0701 - 10/21 0700 In: 810.7 [P.O.:240; I.V.:270.8; IV Piggyback:299.9] Out: 100 [Urine:100]  Filed Weights   12/24/20 0500 12/25/20 0445 12/25/20 0715  Weight: 62.8 kg 63 kg 62.7 kg    Scheduled Meds:  amiodarone  400 mg Oral BID   Followed by   Derrill Memo ON 12/31/2020] amiodarone  200 mg Oral BID   Followed by   Derrill Memo ON 01/07/2021] amiodarone  200 mg Oral Daily   Chlorhexidine Gluconate Cloth  6 each Topical Q0600   diltiazem  120 mg Oral Q8H   insulin aspart  1-3 Units Subcutaneous Q4H   mupirocin ointment  1 application Nasal BID   pravastatin  20 mg Oral Daily   sodium bicarbonate  1,300 mg Oral TID   sodium chloride flush  10-40 mL Intracatheter Q12H   Continuous Infusions:  sodium chloride     sodium chloride     sodium chloride     ceFEPime (MAXIPIME) IV Stopped (12/24/20 1515)   norepinephrine (LEVOPHED) Adult infusion 6 mcg/min (12/25/20 0600)   PRN Meds:.sodium chloride, sodium chloride, fentaNYL, melatonin, [DISCONTINUED] ondansetron **OR** ondansetron (ZOFRAN) IV, oxyCODONE-acetaminophen, traMADol, zolpidem  Current Labs: reviewed    Physical Exam:  Blood pressure (!) 119/98, pulse (!) 107, temperature 98.4 F (36.9 C), temperature source Axillary, resp. rate (!) 24, height 5\' 3"  (1.6 m), weight 62.7 kg, SpO2 93 %. NAD, conversant, sitting up in bed Tachy, irriegular NO rub  Coarse bs throughout No LEE Nonfocal R IJ Temp HD cath  A AoCKD5, [name] follows. [facility] KT eval in process.   Outpt plan for PD as RRT.   With progressive AKi and clinically deteriorating started RRT as iHD 12/25/20.  Has R IJ temp HD cath, CCM placed. HD#2 tomorrow 10/22: 250/500, Na 134, 2K, UF 1-1.5L as able, no  heparin AF + RVR and symptomatic bradycardia; hypotension, s/p PPM. On Amio, diltiazem, BPs soft on/off NE gtt low dose Metabolic acidosis, stop NaHCO3 now on HD Anemia of CKD on ESA as outpt, TSAT ok.  Hb 8s.  Transfuse as neeed AHRF, ? PNA, per CCM, trial of lasix today 160 TID  P As above, HD tomorrow Trial of lasix Daily weights, Daily Renal Panel, Strict I/Os, Avoid nephrotoxins (NSAIDs, judicious IV Contrast)  Medication Issues; Preferred narcotic agents for pain control are hydromorphone, fentanyl, and methadone. Morphine should not be used.  Baclofen should be avoided Avoid oral sodium phosphate and magnesium citrate based laxatives / bowel preps    [name] MD 12/25/2020, 8:24 AM  Recent Labs  Lab 12/23/20 0132 12/24/20 0354 12/24/20 1737 12/25/20 0349  NA 131* 125* 122* 128*  K 4.6 4.5 4.7 5.3*  CL 94* 87*  --  91*  CO2 24 22  --  22  GLUCOSE 100* 283*  --  84  BUN 34* 40*  --  56*  CREATININE 5.91* 6.60*  --  7.76*  CALCIUM 9.0 8.4*  --  8.9    Recent Labs  Lab 12/22/20 0125 12/23/20 0132 12/24/20 0354 12/24/20 1737 12/25/20 0349  WBC 11.2* 21.9* 15.4*  --  17.2*  NEUTROABS 6.4 15.5* 12.2*  --   --   HGB 8.5* 9.9* 8.8* 8.2*  8.8*  HCT 26.5* 29.9* 26.7* 24.0* 25.9*  MCV 107.7* 107.9* 105.5*  --  102.8*  PLT 250 273 258  --  283

## 2020-12-25 NOTE — TOC Benefit Eligibility Note (Signed)
Patient Advocate Encounter  Insurance verification completed.    The patient is currently admitted and upon discharge could be taking Eliquis 5 mg.  The current 30 day co-pay is, $47.00.   The patient is insured through [employer] Medicare Part D    Daneli Butkiewicz, CPhT Pharmacy Patient Advocate Specialist [facility] Team Direct Number: [phone]  Fax: [phone]        

## 2020-12-25 NOTE — Plan of Care (Signed)

## 2020-12-25 NOTE — Progress Notes (Signed)
NAME:  Debra Petersen, MRN:  [id number], DOB:  Jun 20, 1950, LOS: 6 ADMISSION DATE:  12/13/2020 CONSULTATION DATE:  12/23/2020 REFERRING MD:  Tyrell Antonio - TRH CHIEF COMPLAINT: Hypotension, possible sepsis   History of Present Illness:  71 year old woman who presented to [facility] 10/15 for weakness and increasing dizziness x 2 days and palpitations. PMHx significant for HTN, HLD, Aflutter (s/p ablation 2010) with paroxysmal Afib (on Rythmol), SLE c/b lupus nephritis (many years ago), CKD stage V (not dialysis-dependent, undergoing transplant eval at [facility]) and hypothyroidism.  In ED, patient was tachycardic to 130s with telemetry demonstrating Afib with RVR. Cardizem gtt was started at slow rate, given patient report of mild bradycardia at home rates 40s-60s). Heparin gtt was initiated for [facility]. Troponin was elevated to 1843, presumed secondary to demand ischemia. Patient was admitted to [facility] with Cardiology/Nephrology consults.   After 4-5 second pause noted on tele 10/15 with variable HR (50s to 130s), concern raised for sick sinus syndrome and EP was consulted, given complex management of antiarrhythmics in the setting of CKD. Underwent temp pacer placement 10/16 with subsequent permanent pacemaker placement 10/17.  On 10/18PM, patient had brief period of hypotension prompting peripheral low-dose pressor initiation. Additionally, Cr worsened with concern for ARF superimposed on CKD. PCCM was consulted for hypotension/ARF with concern for sepsis.  Pertinent Medical History:   Past Medical History:  Diagnosis Date   CKD (chronic kidney disease)    Lupus (systemic lupus erythematosus) (HCC)    Wide-complex tachycardia    probable atrial flutter with 1-1 and 2-1 conduction   Significant Hospital Events: Including procedures, antibiotic start and stop dates in addition to other pertinent events   10/19 - PCCM consulted in the setting of hypotension with possible sepsis, low-dose pressor requirement. ECHO with  new pericardial effusion 10/20 - re consulted for recurrent hypotension req pressors. C/o chest pain. Needs HD cath placed. Broadening abx   10/21 chest pain feeling better. Got first HD run this morning  Interim History / Subjective:  Weaning NE  Objective:  Blood pressure 119/64, pulse (!) 107, temperature 98.4 F (36.9 C), temperature source Axillary, resp. rate (!) 21, height 5\' 3"  (1.6 m), weight 62.7 kg, SpO2 92 %.        Intake/Output Summary (Last 24 hours) at 12/25/2020 1304 Last data filed at 12/25/2020 1200 Gross per 24 hour  Intake 571.07 ml  Output 350 ml  Net 221.07 ml   Filed Weights   12/24/20 0500 12/25/20 0445 12/25/20 0715  Weight: 62.8 kg 63 kg 62.7 kg   Physical Examination: General: Ill appearing adult F reclined in bed NAD  HEENT: NCAT [place] in place. Anicteric sclera. R IJ c/d/dressed  Neuro: AAOx3 following commands PERRL CV: irir. S1s2 cap refill brisk. Pacemaker dressing c/d/i PULM: Even unlabored. Diminished basilar sounds. Symmetrical chest expansion  GI: soft ndnt. + bowel sounds  Extremities: no acute joint deformity. No cyanosis or clubbing  Skin: pale c/d/w.  Resolved Hospital Problem List:    Assessment & Plan:   Acute metabolic encephalopathy  -renal failure, sepsis  P -delirium precautions -sleep/wake cycle  -metabolic tx as below   Acute respiratory failure with hypoxia  -pulmonary edema, small pleural effusions, splinting and atelectasis. Possible pna -difficult to take deep breaths with chest pain, has been taking very shallow breaths as such  P -volume removal via HD -flutter, IS, mobility   Hypotension / possible septic shock - leukocytosis, elevated PCT - no clear source right now -- could be CAP, there  is pretty limited view of L side wth heart obscuring. R sided opacities probably edema in part, could be PNA.  -concern for ? Pacemaker infection  - febrile x1 on 10/19  -no tamponade  P - cont cefepime, vanc per  pharm - trend fever curve, WBC - Bcx pending  -can change NE to neo if Afib rate is high, if needed   Pericardial effusion  -new on ECHO 10/19. ?uremic vs post procedural.  -repeat ECHO 10/20 without concern for tamponade   pAfib P -cont amio, cont dilt f -K goal 4 mag goal 2 -tele monitoring  AKI on CKD V requiring new RRT  -hx lupus nephritis, undergoing KT eval at [facility]  Hyponatremia Hyperkalemia P -HD per nephro   Possible dysphagia -SLP     Best Practice: (right click and "Reselect all SmartList Selections" daily)   Diet-pending SLP eval Code Status: Full  VTE ppx: SCD GI ppx: n/a  Goals of care: 10/20 -- nephro, ccm  Lines- n/a. Plan to place HD cath 10/20   Labs:  CBC: Recent Labs  Lab 01/02/2021 0439 12/22/2020 0130 12/22/20 0125 12/23/20 0132 12/24/20 0354 12/24/20 1737 12/25/20 0349  WBC 11.5* 10.2 11.2* 21.9* 15.4*  --  17.2*  NEUTROABS 5.8 4.5 6.4 15.5* 12.2*  --   --   HGB 10.1* 8.1* 8.5* 9.9* 8.8* 8.2* 8.8*  HCT 30.7* 24.6* 26.5* 29.9* 26.7* 24.0* 25.9*  MCV 107.7* 107.9* 107.7* 107.9* 105.5*  --  102.8*  PLT 374 259 250 273 258  --  998   Basic Metabolic Panel: Recent Labs  Lab 12/30/2020 0439 12/10/2020 0130 12/22/20 0125 12/23/20 0132 12/24/20 0354 12/24/20 1737 12/25/20 0349  NA 137 134* 136 131* 125* 122* 128*  K 3.9 3.5 4.2 4.6 4.5 4.7 5.3*  CL 112* 107 103 94* 87*  --  91*  CO2 14* 21* 24 24 22   --  22  GLUCOSE 90 91 151* 100* 283*  --  84  BUN 33* 29* 31* 34* 40*  --  56*  CREATININE 5.64* 5.00* 5.40* 5.91* 6.60*  --  7.76*  CALCIUM 9.0 7.9* 8.5* 9.0 8.4*  --  8.9  MG 2.4 2.1 1.9 1.7 2.3  --   --    GFR: Estimated Creatinine Clearance: 5.7 mL/min (A) (by C-G formula based on SCr of 7.76 mg/dL (H)). Recent Labs  Lab 12/22/20 0125 12/23/20 0132 12/23/20 1340 12/23/20 1450 12/24/20 0354 12/25/20 0349  PROCALCITON  --   --   --  4.91 6.15 17.19  WBC 11.2* 21.9*  --   --  15.4* 17.2*  LATICACIDVEN  --   --  2.0* 1.1  --   --     Liver Function Tests: Recent Labs  Lab 12/12/2020 0439 12/22/2020 0130 12/22/20 0125 12/23/20 0132 12/24/20 0354  AST 15 12* 20 35 15  ALT 8 8 9 6 5   ALKPHOS 109 91 111 224* 165*  BILITOT 0.4 0.1* 0.2* 0.3 0.3  PROT 5.9* 4.8* 5.1* 6.1* 5.4*  ALBUMIN 2.6* 2.2* 2.3* 2.5* 2.1*   No results for input(s): LIPASE, AMYLASE in the last 168 hours. No results for input(s): AMMONIA in the last 168 hours.  ABG:    Component Value Date/Time   PHART 7.492 (H) 12/24/2020 1737   PCO2ART 27.7 (L) 12/24/2020 1737   PO2ART 124 (H) 12/24/2020 1737   HCO3 21.2 12/24/2020 1737   TCO2 22 12/24/2020 1737   ACIDBASEDEF 2.0 12/24/2020 1737   O2SAT 99.0 12/24/2020 1737  Coagulation Profile: No results for input(s): INR, PROTIME in the last 168 hours.  Cardiac Enzymes: No results for input(s): CKTOTAL, CKMB, CKMBINDEX, TROPONINI in the last 168 hours.  HbA1C: Hgb A1c MFr Bld  Date/Time Value Ref Range Status  12/24/2020 04:46 PM 5.0 4.8 - 5.6 % Final    Comment:    (NOTE) Pre diabetes:          5.7%-6.4%  Diabetes:              >6.4%  Glycemic control for   <7.0% adults with diabetes   12/12/2020 01:30 AM 4.9 4.8 - 5.6 % Final    Comment:    (NOTE) Pre diabetes:          5.7%-6.4%  Diabetes:              >6.4%  Glycemic control for   <7.0% adults with diabetes    CBG: Recent Labs  Lab 12/24/20 2319 12/25/20 0349 12/25/20 0651 12/25/20 0734 12/25/20 1155  GLUCAP 78 108* 66* 98 96    CRITICAL CARE CRITICAL CARE Performed by: Cristal Generous   Total critical care time: 41 minutes  Critical care time was exclusive of separately billable procedures and treating other patients.  Critical care was necessary to treat or prevent imminent or life-threatening deterioration.  Critical care was time spent personally by me on the following activities: development of treatment plan with patient and/or surrogate as well as nursing, discussions with consultants, evaluation of  patient's response to treatment, examination of patient, obtaining history from patient or surrogate, ordering and performing treatments and interventions, ordering and review of laboratory studies, ordering and review of radiographic studies, pulse oximetry and re-evaluation of patient's condition.  Eliseo Gum MSN, AGACNP-BC [place] for pager  12/25/2020, 1:04 PM

## 2020-12-25 NOTE — Progress Notes (Signed)
ANTICOAGULATION CONSULT NOTE - Follow Up Consult  Pharmacy Consult for Warfarin Indication: atrial fibrillation  Allergies  Allergen Reactions   Codeine Itching    Patient Measurements: Height: 5\' 3"  (160 cm) Weight: 62.7 kg (138 lb 3.7 oz) IBW/kg (Calculated) : 52.4  Vital Signs: Temp: 98.4 F (36.9 C) (10/21 0715) Temp Source: Axillary (10/21 1200) BP: 129/70 (10/21 1500) Pulse Rate: 90 (10/21 1200)  Labs: Recent Labs    12/23/20 0132 12/24/20 0354 12/24/20 1737 12/25/20 0349  HGB 9.9* 8.8* 8.2* 8.8*  HCT 29.9* 26.7* 24.0* 25.9*  PLT 273 258  --  283  CREATININE 5.91* 6.60*  --  7.76*    Estimated Creatinine Clearance: 5.7 mL/min (A) (by C-G formula based on SCr of 7.76 mg/dL (H)).   Assessment: 70 year old female to begin warfarin for Afib, s/p PPM this admission  Goal of Therapy:  INR 2-3 Monitor platelets by anticoagulation protocol: Yes   Plan:  Warfarin 5 mg po x 1 dose today Daily INR  Thank you Anette Guarneri, PharmD  12/25/2020,4:13 PM

## 2020-12-25 NOTE — Progress Notes (Signed)
Discussed case with nephrologist. Will assist with clipping when deemed appropriate.   Melven Sartorius Renal Navigator [phone]

## 2020-12-25 NOTE — Progress Notes (Addendum)
  HR remains overall controlled as low as 80s at times.   Continues to have excursions up into 120s, rarely 140s.   Would continue amiodarone and diltiazem as ordered. Suspect her rate excursions will continue to improve as/is her overall condition does. Ultimately will be ideal to get her back into NSR, but remains off [place] for time being for pending procedures.  She is having more SOB and is currently on NRB. CXR last night showed grossly stable bilateral airspace disease and effusions, most compatible with pulmonary edema.   Would start Coumadin when felt stable to do so. We typically avoid heparin in post op period of PPM implant.   Would repeat limited echo next week to continue to monitor pericardial effusion, especially with any worsening or with resumption of [place].   Hgb 8.8, WBC 17.2, Plt 283.  Cr continues to trend up 7.76, BUN up to 56, K 5.3. Suspect she will need CRT soon, especially with worsening respiratory status.   Legrand Como [address]" [place], PA-C  12/25/2020 9:33 AM

## 2020-12-26 ENCOUNTER — Encounter (HOSPITAL_COMMUNITY): Payer: Self-pay | Admitting: Internal Medicine

## 2020-12-26 DIAGNOSIS — J189 Pneumonia, unspecified organism: Secondary | ICD-10-CM

## 2020-12-26 LAB — CBC
HCT: 24.2 % — ABNORMAL LOW (ref 36.0–46.0)
Hemoglobin: 8.5 g/dL — ABNORMAL LOW (ref 12.0–15.0)
MCH: 34.8 pg — ABNORMAL HIGH (ref 26.0–34.0)
MCHC: 35.1 g/dL (ref 30.0–36.0)
MCV: 99.2 fL (ref 80.0–100.0)
Platelets: 290 10*3/uL (ref 150–400)
RBC: 2.44 MIL/uL — ABNORMAL LOW (ref 3.87–5.11)
RDW: 15.1 % (ref 11.5–15.5)
WBC: 19 10*3/uL — ABNORMAL HIGH (ref 4.0–10.5)
nRBC: 0.4 % — ABNORMAL HIGH (ref 0.0–0.2)

## 2020-12-26 LAB — PROTIME-INR
INR: 2.8 — ABNORMAL HIGH (ref 0.8–1.2)
INR: 6.3 (ref 0.8–1.2)
Prothrombin Time: 29.7 seconds — ABNORMAL HIGH (ref 11.4–15.2)
Prothrombin Time: 55.4 seconds — ABNORMAL HIGH (ref 11.4–15.2)

## 2020-12-26 LAB — GLUCOSE, CAPILLARY
Glucose-Capillary: 92 mg/dL (ref 70–99)
Glucose-Capillary: 79 mg/dL (ref 70–99)
Glucose-Capillary: 77 mg/dL (ref 70–99)
Glucose-Capillary: 91 mg/dL (ref 70–99)
Glucose-Capillary: 92 mg/dL (ref 70–99)
Glucose-Capillary: 95 mg/dL (ref 70–99)

## 2020-12-26 LAB — BASIC METABOLIC PANEL
Anion gap: 12 (ref 5–15)
BUN: 45 mg/dL — ABNORMAL HIGH (ref 8–23)
CO2: 23 mmol/L (ref 22–32)
Calcium: 9.3 mg/dL (ref 8.9–10.3)
Chloride: 96 mmol/L — ABNORMAL LOW (ref 98–111)
Creatinine, Ser: 5.9 mg/dL — ABNORMAL HIGH (ref 0.44–1.00)
GFR, Estimated: 7 mL/min — ABNORMAL LOW (ref >60)
Glucose, Bld: 99 mg/dL (ref 70–99)
Potassium: 4.7 mmol/L (ref 3.5–5.1)
Sodium: 131 mmol/L — ABNORMAL LOW (ref 135–145)

## 2020-12-26 LAB — PHOSPHORUS: Phosphorus: 7.9 mg/dL — ABNORMAL HIGH (ref 2.5–4.6)

## 2020-12-26 LAB — HEPATITIS B SURFACE ANTIBODY, QUANTITATIVE: Hep B S AB Quant (Post): <3.1 m[IU]/mL — ABNORMAL LOW (ref >9.9)

## 2020-12-26 LAB — VANCOMYCIN, RANDOM: Vancomycin Rm: 25

## 2020-12-26 LAB — MAGNESIUM: Magnesium: 3.2 mg/dL — ABNORMAL HIGH (ref 1.7–2.4)

## 2020-12-26 MED ORDER — VASOPRESSIN 20 UNITS/100 ML INFUSION FOR SHOCK
0.0000 [IU]/min | INTRAVENOUS | Status: DC
  Administered 2020-12-26 – 2020-12-28 (×5): 0.03 [IU]/min via INTRAVENOUS
  Administered 2020-12-28: 0.02 [IU]/min via INTRAVENOUS
  Administered 2020-12-29: 0.03 [IU]/min via INTRAVENOUS
  Filled 2020-12-26 (×6): qty 100

## 2020-12-26 MED ORDER — PHENYLEPHRINE HCL-NACL 20-0.9 MG/250ML-% IV SOLN: 0.0000 ug/min | INTRAVENOUS | Status: DC

## 2020-12-26 MED ORDER — FOOD THICKENER (SIMPLYTHICK)
1.0000 | ORAL | Status: DC | PRN
  Administered 2020-12-26: 1 via ORAL

## 2020-12-26 MED ORDER — VITAMIN K1 10 MG/ML IJ SOLN
5.0000 mg | Freq: Once | INTRAVENOUS | Status: AC
  Administered 2020-12-26: 5 mg via INTRAVENOUS
  Filled 2020-12-26: qty 0.5

## 2020-12-26 MED ORDER — MIDODRINE HCL 5 MG PO TABS
5.0000 mg | ORAL_TABLET | Freq: Three times a day (TID) | ORAL | Status: DC
  Administered 2020-12-26: 5 mg via ORAL
  Filled 2020-12-26 (×2): qty 1

## 2020-12-26 MED ORDER — POLYETHYLENE GLYCOL 3350 17 G PO PACK
17.0000 g | PACK | Freq: Two times a day (BID) | ORAL | Status: DC
  Administered 2020-12-26 (×2): 17 g via ORAL
  Filled 2020-12-26 (×2): qty 1

## 2020-12-26 MED ORDER — DARBEPOETIN ALFA 40 MCG/0.4ML IJ SOSY
40.0000 ug | PREFILLED_SYRINGE | INTRAMUSCULAR | Status: DC
  Filled 2020-12-26: qty 0.4

## 2020-12-26 MED ORDER — SENNOSIDES-DOCUSATE SODIUM 8.6-50 MG PO TABS
1.0000 | ORAL_TABLET | Freq: Two times a day (BID) | ORAL | Status: DC
  Administered 2020-12-26 (×2): 1 via ORAL
  Filled 2020-12-26 (×2): qty 1

## 2020-12-26 NOTE — Progress Notes (Addendum)
NAME:  Debra Petersen, MRN:  [id number], DOB:  Jul 17, 1950, LOS: 7 ADMISSION DATE:  01/02/2021 CONSULTATION DATE:  12/23/2020 REFERRING MD:  Tyrell Antonio - TR[facility] CHIEF COMPLAINT: Hypotension, possible sepsis   History of Present Illness:  70 year old woman who presented to [facility] 10/15 for weakness and increasing dizziness x 2 days and palpitations. 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  In ED, patient was tachycardic to 130s with telemetry demonstrating Afib with RVR. Cardizem gtt was started at slow rate, given patient report of mild bradycardia at home rates 40s-60s). Heparin gtt was initiated for [facility]. Troponin was elevated to 1843, presumed secondary to demand ischemia. Patient was admitted to [facility] with Cardiology/Nephrology consults.   After 4-5 second pause noted on tele 10/15 with variable HR (50s to 130s), concern raised for sick sinus syndrome and EP was consulted, given complex management of antiarrhythmics in the setting of CKD. Underwent temp pacer placement 10/16 with subsequent permanent pacemaker placement 10/17.  On 10/18PM, patient had brief period of hypotension prompting peripheral low-dose pressor initiation. Additionally, Cr worsened with concern for ARF superimposed on CKD. PCCM was consulted for hypotension/ARF with concern for sepsis.  Pertinent Medical History:   Past Medical History:  Diagnosis Date   CKD (chronic kidney disease)    Lupus (systemic lupus erythematosus) (HCC)    Wide-complex tachycardia    probable atrial flutter with 1-1 and 2-1 conduction   Significant Hospital Events: Including procedures, antibiotic start and stop dates in addition to other pertinent events   10/19 - PCCM consulted in the setting of hypotension with possible sepsis, low-dose pressor requirement. ECHO with  new pericardial effusion 10/20 - re consulted for recurrent hypotension req pressors. C/o chest pain. Needs HD cath placed. Broadening abx   10/21 chest pain feeling better. Got first HD run this morning 10/22 on Nrb. More confused. On low dose NE.   Interim History / Subjective:   Weaning NE again INR is 2.8   Son at bedside and pt up in chair   Objective:  Blood pressure 119/68, pulse 99, temperature (!) 97.2 F (36.2 C), temperature source Oral, resp. rate (!) 21, height 5\' 3"  (1.6 m), weight 59.4 kg, SpO2 (!) 73 %.        Intake/Output Summary (Last 24 hours) at 12/26/2020 1246 Last data filed at 12/26/2020 9476 Gross per 24 hour  Intake 613.65 ml  Output 160 ml  Net 453.65 ml   Filed Weights   12/25/20 0715 12/26/20 0500 12/26/20 0642  Weight: 62.7 kg 59.4 kg 59.4 kg   Physical Examination: General: Ill appearing older adult F in recliner NAD  Neuro:awake alert, intermittently disoriented. No focal deficits  HEENT: NCAT NRB in place. R IJ HD cath c/d/i CV: irir s1s2 no rgm  PULM: Even unlabored. Diminished basilar sounds. Symmetrical chest expansion  GI: soft ndnt. + bowel sounds  Extremities: no acute joint deformity. No cyanosis or clubbing  Skin: pale c/d/w.    Resolved Hospital Problem List:    Assessment & Plan:    Acute metabolic encephalopathy ICU delirium -renal failure, sepsis  P -delirium precautions -sleep/wake cycle  -metabolic tx as below  -PRN melatonin  Acute respiratory failure with hypoxia  -pulmonary edema, small pleural effusions, possible PNA, splinting and atelectasis. -difficult to take deep breaths with chest pain, has been taking very shallow breaths as  such  P -volume removal via HD -flutter, IS, mobility  -cont abx for possible PNA  Hypotension / possible septic shock  - leukocytosis, elevated PCT but no clear source of infection. Could be CAP vs ?pacemaker infection  -no tamponade with pericardial effusion  -if not  infection related, could consider vasoplegia with renal dysfunction  P - cont cefepime, vanc per pharm - trend fever curve, WBC - Bcx NGTD -adding vaso to hopefully facilitate NE wean  -add midodrine   pAFib  P -Dilt per EP, Amio -K goal 4 mag goal 2 -tele monitoring -coumadin per pharm -- though 10/22 coagulopathic as below  Coagulopathy P -recheck, if still high would need to reconsider coumadin and would check LFTs   AKI on CKD V requiring RRT  -hx lupus nephritis, undergoing transplant eval at [facility]  Hyponatremia Hyperkalemia Hyperphosphatemia Hypermagnesemia  P -iHD per nephro   Pericardial effusion  -new on ECHO 10/19. ?uremic vs post procedural.  -repeat ECHO 10/20 without concern for tamponade  Suspected dysphagia  -SLP eval -- nectar thick, meds with puree -aspiration precautions    Best Practice: (right click and "Reselect all SmartList Selections" daily)   Diet-nectar thick  Code Status: Full  VTE ppx: SCD GI ppx: n/a  Goals of care: 10/22 pt and son at bedside  Lines- temp HD cath RIJ   Labs:  CBC: Recent Labs  Lab 12/31/2020 0439 01/02/2021 0130 12/22/20 0125 12/23/20 0132 12/24/20 0354 12/24/20 1737 12/25/20 0349 12/26/20 0348  WBC 11.5* 10.2 11.2* 21.9* 15.4*  --  17.2* 19.0*  NEUTROABS 5.8 4.5 6.4 15.5* 12.2*  --   --   --   HGB 10.1* 8.1* 8.5* 9.9* 8.8* 8.2* 8.8* 8.5*  HCT 30.7* 24.6* 26.5* 29.9* 26.7* 24.0* 25.9* 24.2*  MCV 107.7* 107.9* 107.7* 107.9* 105.5*  --  102.8* 99.2  PLT 374 259 250 273 258  --  283 147   Basic Metabolic Panel: Recent Labs  Lab 12/11/2020 0130 12/22/20 0125 12/23/20 0132 12/24/20 0354 12/24/20 1737 12/25/20 0349 12/26/20 0348  NA 134* 136 131* 125* 122* 128* 131*  K 3.5 4.2 4.6 4.5 4.7 5.3* 4.7  CL 107 103 94* 87*  --  91* 96*  CO2 21* 24 24 22   --  22 23  GLUCOSE 91 151* 100* 283*  --  84 99  BUN 29* 31* 34* 40*  --  56* 45*  CREATININE 5.00* 5.40* 5.91* 6.60*  --  7.76* 5.90*  CALCIUM 7.9* 8.5* 9.0  8.4*  --  8.9 9.3  MG 2.1 1.9 1.7 2.3  --   --  3.2*  PHOS  --   --   --   --   --   --  7.9*   GFR: Estimated Creatinine Clearance: 7.4 mL/min (A) (by C-G formula based on SCr of 5.9 mg/dL (H)). Recent Labs  Lab 12/23/20 0132 12/23/20 1340 12/23/20 1450 12/24/20 0354 12/25/20 0349 12/26/20 0348  PROCALCITON  --   --  4.91 6.15 17.19  --   WBC 21.9*  --   --  15.4* 17.2* 19.0*  LATICACIDVEN  --  2.0* 1.1  --   --   --    Liver Function Tests: Recent Labs  Lab 12/29/2020 0439 12/17/2020 0130 12/22/20 0125 12/23/20 0132 12/24/20 0354  AST 15 12* 20 35 15  ALT 8 8 9 6 5   ALKPHOS 109 91 111 224* 165*  BILITOT 0.4 0.1* 0.2* 0.3 0.3  PROT 5.9* 4.8* 5.1* 6.1*  5.4*  ALBUMIN 2.6* 2.2* 2.3* 2.5* 2.1*   No results for input(s): LIPASE, AMYLASE in the last 168 hours. No results for input(s): AMMONIA in the last 168 hours.  ABG:    Component Value Date/Time   PHART 7.492 (H) 12/24/2020 1737   PCO2ART 27.7 (L) 12/24/2020 1737   PO2ART 124 (H) 12/24/2020 1737   HCO3 21.2 12/24/2020 1737   TCO2 22 12/24/2020 1737   ACIDBASEDEF 2.0 12/24/2020 1737   O2SAT 99.0 12/24/2020 1737    Coagulation Profile: Recent Labs  Lab 12/26/20 0348  INR 2.8*    Cardiac Enzymes: No results for input(s): CKTOTAL, CKMB, CKMBINDEX, TROPONINI in the last 168 hours.  HbA1C: Hgb A1c MFr Bld  Date/Time Value Ref Range Status  12/24/2020 04:46 PM 5.0 4.8 - 5.6 % Final    Comment:    (NOTE) Pre diabetes:          5.7%-6.4%  Diabetes:              >6.4%  Glycemic control for   <7.0% adults with diabetes   12/15/2020 01:30 AM 4.9 4.8 - 5.6 % Final    Comment:    (NOTE) Pre diabetes:          5.7%-6.4%  Diabetes:              >6.4%  Glycemic control for   <7.0% adults with diabetes    CBG: Recent Labs  Lab 12/25/20 2339 12/26/20 0348 12/26/20 0656 12/26/20 0820 12/26/20 1121  GLUCAP 97 92 79 77 91    CRITICAL CARE Performed by: Cristal Generous   Total critical care time: 39  minutes  Critical care time was exclusive of separately billable procedures and treating other patients. Critical care was necessary to treat or prevent imminent or life-threatening deterioration.  Critical care was time spent personally by me on the following activities: development of treatment plan with patient and/or surrogate as well as nursing, discussions with consultants, evaluation of patient's response to treatment, examination of patient, obtaining history from patient or surrogate, ordering and performing treatments and interventions, ordering and review of laboratory studies, ordering and review of radiographic studies, pulse oximetry and re-evaluation of patient's condition.  Eliseo Gum MSN, AGACNP-BC [place] for pager  12/26/2020, 12:46 PM

## 2020-12-26 NOTE — Progress Notes (Signed)
Admit: 12/23/2020 LOS: 7  45F [name], hx/o SLE and LN, admit with AF+RVR --> symptomatic bradycardia s/p temp PM.   Subjective:  Still req low dose NE Did not respond to furosemide; very little UOP Req NRB  Son at  bedside, updated  10/21 0701 - 10/22 0700 In: 723.7 [P.O.:190; I.V.:94.7; IV Piggyback:439] Out: 460 [Urine:160]  Filed Weights   12/25/20 0715 12/26/20 0500 12/26/20 0642  Weight: 62.7 kg 59.4 kg 59.4 kg    Scheduled Meds:  amiodarone  400 mg Oral BID   Followed by   Derrill Memo ON 12/31/2020] amiodarone  200 mg Oral BID   Followed by   Derrill Memo ON 01/07/2021] amiodarone  200 mg Oral Daily   Chlorhexidine Gluconate Cloth  6 each Topical Daily   diltiazem  120 mg Oral Q8H   insulin aspart  1-3 Units Subcutaneous Q4H   pravastatin  20 mg Oral Daily   sodium chloride flush  10-40 mL Intracatheter Q12H   Warfarin - Pharmacist Dosing Inpatient   Does not apply q1600   Continuous Infusions:  sodium chloride     sodium chloride     sodium chloride     ceFEPime (MAXIPIME) IV Stopped (12/25/20 1611)   norepinephrine (LEVOPHED) Adult infusion 4 mcg/min (12/26/20 0600)   vasopressin     PRN Meds:.sodium chloride, sodium chloride, fentaNYL, melatonin, [DISCONTINUED] ondansetron **OR** ondansetron (ZOFRAN) IV, oxyCODONE-acetaminophen, traMADol  Current Labs: reviewed    Physical Exam:  Blood pressure (!) 104/52, pulse 99, temperature (!) 97.2 F (36.2 C), temperature source Axillary, resp. rate 16, height 5\' 3"  (1.6 m), weight 59.4 kg, SpO2 (!) 89 %. NAD, conversant, sitting up in bed, confused, on NRB Nl Rate, irriegular NO rub  Coarse bs throughout No LEE Nonfocal R IJ Temp HD cath  A AoCKD5, [name] follows. [facility] KT eval in process.   Outpt plan for PD as RRT.   With progressive AKi and clinically deteriorating started RRT as iHD 12/25/20.  Has R IJ temp HD cath, CCM placed. HD#2 today 10/22: 250/500, Na 134, 2K, UF 1-1.5L as able, no heparin; use  pressors t ohelp with UF.  IVB albumin 25gm x2 prn, Machine Temp 36C.   If still not achieving UF and #5 persists, can consider CRRT AF + RVR and symptomatic bradycardia; hypotension, s/p PPM. On Amio, diltiazem, BPs soft on/off NE gtt low dose Metabolic acidosis, control with HD Anemia of CKD on ESA as outpt, TSAT ok.  Hb 8s.  Transfuse as neeed AHRF, ? PNA, per CCM, trial of lasix today 160 TID  P As above, HD today Stop lasix Aranesp today Daily weights, Daily Renal Panel, Strict I/Os, Avoid nephrotoxins (NSAIDs, judicious IV Contrast)  Medication Issues; Preferred narcotic agents for pain control are hydromorphone, fentanyl, and methadone. Morphine should not be used.  Baclofen should be avoided Avoid oral sodium phosphate and magnesium citrate based laxatives / bowel preps    [name] MD 12/26/2020, 8:41 AM  Recent Labs  Lab 12/24/20 0354 12/24/20 1737 12/25/20 0349 12/26/20 0348  NA 125* 122* 128* 131*  K 4.5 4.7 5.3* 4.7  CL 87*  --  91* 96*  CO2 22  --  22 23  GLUCOSE 283*  --  84 99  BUN 40*  --  56* 45*  CREATININE 6.60*  --  7.76* 5.90*  CALCIUM 8.4*  --  8.9 9.3  PHOS  --   --   --  7.9*    Recent Labs  Lab 12/22/20 0125 12/23/20 0132 12/24/20 0354 12/24/20 1737 12/25/20 0349 12/26/20 0348  WBC 11.2* 21.9* 15.4*  --  17.2* 19.0*  NEUTROABS 6.4 15.5* 12.2*  --   --   --   HGB 8.5* 9.9* 8.8* 8.2* 8.8* 8.5*  HCT 26.5* 29.9* 26.7* 24.0* 25.9* 24.2*  MCV 107.7* 107.9* 105.5*  --  102.8* 99.2  PLT 250 273 258  --  283 290

## 2020-12-26 NOTE — Progress Notes (Signed)
ANTICOAGULATION CONSULT NOTE - Follow Up Consult  Pharmacy Consult for Warfarin Indication: atrial fibrillation  Allergies  Allergen Reactions   Codeine Itching    Patient Measurements: Height: 5\' 3"  (160 cm) Weight: 59.4 kg (130 lb 15.3 oz) IBW/kg (Calculated) : 52.4  Vital Signs: Temp: 97.6 F (36.4 C) (10/22 1538) Temp Source: Axillary (10/22 1538) BP: 106/50 (10/22 1500) Pulse Rate: 97 (10/22 1200)  Labs: Recent Labs    12/24/20 0354 12/24/20 1737 12/25/20 0349 12/26/20 0348 12/26/20 1413  HGB 8.8* 8.2* 8.8* 8.5*  --   HCT 26.7* 24.0* 25.9* 24.2*  --   PLT 258  --  283 290  --   LABPROT  --   --   --  29.7* 55.4*  INR  --   --   --  2.8* 6.3*  CREATININE 6.60*  --  7.76* 5.90*  --      Estimated Creatinine Clearance: 7.4 mL/min (A) (by C-G formula based on SCr of 5.9 mg/dL (H)).   Assessment: 70 year old female to begin warfarin for Afib, s/p PPM this admission.   INR came back at 2.8 this morning after 1 dose of warfarin 5 mg. Repeat INR came back at 6.3. Hgb 8.5, plt 290. No s/sx of bleeding.  Goal of Therapy:  INR 2-3 Monitor platelets by anticoagulation protocol: Yes   Plan:  Hold warfarin tonight  Daily INR  Arrie Senate, PharmD, BCPS, [facility] Clinical Pharmacist [phone] Please check AMION for all [place] numbers 12/26/2020

## 2020-12-26 NOTE — Progress Notes (Addendum)
Pharmacy Antibiotic Note  Debra Petersen is a 70 y.o. female admitted on 12/08/2020 with pneumonia.  Pharmacy has been consulted for cefepime dosing.  Asked to add vancomycin today for MRSA PCR+, and suspected sepsis.  WBC 21.9 > 19, afebrile. PCT 6.15>17.19. Received 2 hours of HD yesterday - plan for 2.5 hours today. Vancomycin random came back at 25 today prior to HD.   Plan: Continue Cefepime 1g IV every 24 hours Hold on redosing vancomycin tonight with HD - will get VR with AM labs after HD session  Monitor renal fx, cx results, clinical pic, LOT  Height: 5\' 3"  (160 cm) Weight: 59.4 kg (130 lb 15.3 oz) IBW/kg (Calculated) : 52.4  Temp (24hrs), Avg:97.8 F (36.6 C), Min:97.2 F (36.2 C), Max:98.3 F (36.8 C)  Recent Labs  Lab 12/22/20 0125 12/23/20 0132 12/23/20 1340 12/23/20 1450 12/24/20 0354 12/25/20 0349 12/26/20 0348 12/26/20 1413  WBC 11.2* 21.9*  --   --  15.4* 17.2* 19.0*  --   CREATININE 5.40* 5.91*  --   --  6.60* 7.76* 5.90*  --   LATICACIDVEN  --   --  2.0* 1.1  --   --   --   --   VANCORANDOM  --   --   --   --   --   --   --  25     Estimated Creatinine Clearance: 7.4 mL/min (A) (by C-G formula based on SCr of 5.9 mg/dL (H)).    Allergies  Allergen Reactions   Codeine Itching    Antimicrobials this admission: Cefepime 10/19 >>  Vancomycin 10/20>>  Dose adjustments this admission: N/A  Microbiology results: 10/19 BCx: sent  10/16 MRSA PCR: neg 10/16 Surgical PCR MRSA +  Thank you for allowing pharmacy to be a part of this patient's care.  Antonietta Jewel, PharmD, [facility] Clinical Pharmacist  Phone: [phone] 12/26/2020 3:15 PM  Please check AMION for all [place] phone numbers After 10:00 PM, call [place] [phone]

## 2020-12-26 NOTE — Progress Notes (Signed)
[place] Progress Note Patient Name: Debra Petersen DOB: 11-08-1950 MRN: [id number]   Date of Service  12/26/2020  HPI/Events of Note  Increased WOB and O2 requirements to NRB + HFNC. Due to have HD, but not until after midnight.  eICU Interventions  Plan: BiPAP 12/5. FiO2 to keep sat >= 93%.     Intervention Category Major Interventions: Respiratory failure - evaluation and management;Hypoxemia - evaluation and management  Lysle Dingwall 12/26/2020, 8:12 PM

## 2020-12-26 NOTE — Progress Notes (Signed)
Chest PT held. Pt is up in chair at this time.

## 2020-12-26 NOTE — Plan of Care (Signed)

## 2020-12-26 NOTE — Progress Notes (Signed)
CPT held at this time. Pt being placed on BIPAP and is extremely anxious.

## 2020-12-26 NOTE — Progress Notes (Signed)
OT Cancellation Note  Patient Details Name: Debra Petersen MRN: [id number] DOB: 1950/11/12   Cancelled Treatment:    Reason Eval/Treat Not Completed: Patient at procedure or test/ unavailable.  Spoke with RN, OT to attempt this afternoon after HD as schedule allows, and patient's abilities.    Debra Petersen 12/26/2020, 8:47 AM

## 2020-12-27 ENCOUNTER — Inpatient Hospital Stay (HOSPITAL_COMMUNITY): Payer: Medicare Other

## 2020-12-27 DIAGNOSIS — J810 Acute pulmonary edema: Secondary | ICD-10-CM

## 2020-12-27 DIAGNOSIS — G9341 Metabolic encephalopathy: Secondary | ICD-10-CM

## 2020-12-27 DIAGNOSIS — R579 Shock, unspecified: Secondary | ICD-10-CM

## 2020-12-27 LAB — PHOSPHORUS: Phosphorus: 3.6 mg/dL (ref 2.5–4.6)

## 2020-12-27 LAB — CBC
HCT: 22.8 % — ABNORMAL LOW (ref 36.0–46.0)
Hemoglobin: 8.2 g/dL — ABNORMAL LOW (ref 12.0–15.0)
MCH: 34.9 pg — ABNORMAL HIGH (ref 26.0–34.0)
MCHC: 36 g/dL (ref 30.0–36.0)
MCV: 97 fL (ref 80.0–100.0)
Platelets: 212 10*3/uL (ref 150–400)
RBC: 2.35 MIL/uL — ABNORMAL LOW (ref 3.87–5.11)
RDW: 14.9 % (ref 11.5–15.5)
WBC: 19 10*3/uL — ABNORMAL HIGH (ref 4.0–10.5)
nRBC: 0.8 % — ABNORMAL HIGH (ref 0.0–0.2)

## 2020-12-27 LAB — GLUCOSE, CAPILLARY
Glucose-Capillary: 100 mg/dL — ABNORMAL HIGH (ref 70–99)
Glucose-Capillary: 76 mg/dL (ref 70–99)
Glucose-Capillary: 87 mg/dL (ref 70–99)
Glucose-Capillary: 92 mg/dL (ref 70–99)
Glucose-Capillary: 95 mg/dL (ref 70–99)
Glucose-Capillary: 98 mg/dL (ref 70–99)
Glucose-Capillary: 98 mg/dL (ref 70–99)

## 2020-12-27 LAB — BRAIN NATRIURETIC PEPTIDE: B Natriuretic Peptide: 1561.2 pg/mL — ABNORMAL HIGH (ref 0.0–100.0)

## 2020-12-27 LAB — BASIC METABOLIC PANEL
Anion gap: 13 (ref 5–15)
BUN: 20 mg/dL (ref 8–23)
CO2: 25 mmol/L (ref 22–32)
Calcium: 8.7 mg/dL — ABNORMAL LOW (ref 8.9–10.3)
Chloride: 96 mmol/L — ABNORMAL LOW (ref 98–111)
Creatinine, Ser: 2.69 mg/dL — ABNORMAL HIGH (ref 0.44–1.00)
GFR, Estimated: 19 mL/min — ABNORMAL LOW (ref >60)
Glucose, Bld: 94 mg/dL (ref 70–99)
Potassium: 3.3 mmol/L — ABNORMAL LOW (ref 3.5–5.1)
Sodium: 134 mmol/L — ABNORMAL LOW (ref 135–145)

## 2020-12-27 LAB — PROTIME-INR
INR: 1.4 — ABNORMAL HIGH (ref 0.8–1.2)
Prothrombin Time: 17.2 seconds — ABNORMAL HIGH (ref 11.4–15.2)

## 2020-12-27 LAB — MAGNESIUM: Magnesium: 2.2 mg/dL (ref 1.7–2.4)

## 2020-12-27 LAB — VANCOMYCIN, RANDOM: Vancomycin Rm: 12

## 2020-12-27 MED ORDER — WARFARIN SODIUM 1 MG PO TABS: 1.0000 mg | ORAL_TABLET | Freq: Once | ORAL | Status: DC

## 2020-12-27 MED ORDER — POTASSIUM CHLORIDE 10 MEQ/100ML IV SOLN
10.0000 meq | INTRAVENOUS | Status: AC
Start: 2020-12-27 — End: 2020-12-27
  Administered 2020-12-27 (×3): 10 meq via INTRAVENOUS
  Filled 2020-12-27: qty 100

## 2020-12-27 MED ORDER — CHLORHEXIDINE GLUCONATE CLOTH 2 % EX PADS
6.0000 | MEDICATED_PAD | Freq: Every day | CUTANEOUS | Status: DC
  Administered 2020-12-28 – 2020-12-31 (×4): 6 via TOPICAL

## 2020-12-27 MED ORDER — SODIUM CHLORIDE 0.9 % IV SOLN
2.0000 g | Freq: Two times a day (BID) | INTRAVENOUS | Status: DC
  Administered 2020-12-27 – 2020-12-29 (×5): 2 g via INTRAVENOUS
  Filled 2020-12-27 (×5): qty 2

## 2020-12-27 MED ORDER — PRISMASOL BGK 4/2.5 32-4-2.5 MEQ/L EC SOLN: Status: DC

## 2020-12-27 MED ORDER — AMIODARONE LOAD VIA INFUSION
150.0000 mg | Freq: Once | INTRAVENOUS | Status: AC
  Administered 2020-12-27: 150 mg via INTRAVENOUS
  Filled 2020-12-27: qty 83.34

## 2020-12-27 MED ORDER — HEPARIN SODIUM (PORCINE) 1000 UNIT/ML IJ SOLN
1.2000 mL | Freq: Once | INTRAMUSCULAR | Status: AC
  Administered 2020-12-27: 1200 [IU]

## 2020-12-27 MED ORDER — CHLORHEXIDINE GLUCONATE 0.12 % MT SOLN
15.0000 mL | Freq: Two times a day (BID) | OROMUCOSAL | Status: DC
  Administered 2020-12-27 – 2020-12-31 (×9): 15 mL via OROMUCOSAL
  Filled 2020-12-27 (×7): qty 15

## 2020-12-27 MED ORDER — ALTEPLASE 2 MG IJ SOLR
2.0000 mg | Freq: Once | INTRAMUSCULAR | Status: AC
  Administered 2020-12-27: 2 mg
  Filled 2020-12-27: qty 2

## 2020-12-27 MED ORDER — HEPARIN SODIUM (PORCINE) 1000 UNIT/ML DIALYSIS
1000.0000 [IU] | INTRAMUSCULAR | Status: DC | PRN
  Administered 2020-12-31: 2800 [IU] via INTRAVENOUS_CENTRAL
  Filled 2020-12-27: qty 6
  Filled 2020-12-27 (×2): qty 3
  Filled 2020-12-27: qty 6

## 2020-12-27 MED ORDER — DARBEPOETIN ALFA 40 MCG/0.4ML IJ SOSY
40.0000 ug | PREFILLED_SYRINGE | INTRAMUSCULAR | Status: DC
  Administered 2020-12-28: 40 ug via SUBCUTANEOUS
  Filled 2020-12-27: qty 0.4

## 2020-12-27 MED ORDER — HYDROMORPHONE HCL 1 MG/ML IJ SOLN
0.5000 mg | INTRAMUSCULAR | Status: DC | PRN
Start: 2020-12-27 — End: 2021-01-01
  Administered 2020-12-27 – 2021-01-01 (×11): 0.5 mg via INTRAVENOUS
  Filled 2020-12-27 (×11): qty 0.5

## 2020-12-27 MED ORDER — HEPARIN SODIUM (PORCINE) 1000 UNIT/ML IJ SOLN
INTRAMUSCULAR | Status: AC
  Filled 2020-12-27: qty 4

## 2020-12-27 MED ORDER — PRISMASOL BGK 4/2.5 32-4-2.5 MEQ/L REPLACEMENT SOLN: Status: DC

## 2020-12-27 MED ORDER — ORAL CARE MOUTH RINSE
15.0000 mL | Freq: Two times a day (BID) | OROMUCOSAL | Status: DC
  Administered 2020-12-27 – 2020-12-31 (×8): 15 mL via OROMUCOSAL

## 2020-12-27 MED ORDER — SODIUM CHLORIDE 0.9 % IV SOLN
250.0000 [IU]/h | INTRAVENOUS | Status: DC
  Administered 2020-12-27: 250 [IU]/h via INTRAVENOUS_CENTRAL
  Filled 2020-12-27: qty 2

## 2020-12-27 MED ORDER — AMIODARONE HCL IN DEXTROSE 360-4.14 MG/200ML-% IV SOLN
60.0000 mg/h | INTRAVENOUS | Status: AC
  Administered 2020-12-27 – 2020-12-28 (×2): 60 mg/h via INTRAVENOUS
  Filled 2020-12-27 (×2): qty 200

## 2020-12-27 MED ORDER — VANCOMYCIN HCL 750 MG/150ML IV SOLN
750.0000 mg | INTRAVENOUS | Status: DC
  Administered 2020-12-27 – 2020-12-29 (×3): 750 mg via INTRAVENOUS
  Filled 2020-12-27 (×4): qty 150

## 2020-12-27 MED ORDER — AMIODARONE HCL IN DEXTROSE 360-4.14 MG/200ML-% IV SOLN
30.0000 mg/h | INTRAVENOUS | Status: DC
  Administered 2020-12-28 – 2020-12-30 (×5): 30 mg/h via INTRAVENOUS
  Filled 2020-12-27 (×3): qty 200
  Filled 2020-12-27: qty 400
  Filled 2020-12-27 (×2): qty 200

## 2020-12-27 MED ORDER — SODIUM CHLORIDE 0.9 % FOR CRRT: INTRAVENOUS_CENTRAL | Status: DC | PRN

## 2020-12-27 MED ORDER — ALTEPLASE 2 MG IJ SOLR
2.0000 mg | Freq: Once | INTRAMUSCULAR | Status: AC
  Administered 2020-12-27: 2 mg

## 2020-12-27 NOTE — Progress Notes (Signed)
[place] Progress Note Patient Name: Debra Petersen DOB: 02-14-1951 MRN: [id number]   Date of Service  12/27/2020  HPI/Events of Note  Multiple issues: 1. Continues to look marginal on BiPAP. Sats = 75-87%. RR = 26. Nursing requests narcotics for "anxiety" and 2. AFIB with RVR HR = 126. Unable to take PO Amiodarone and Cardizem. Patient is on Norepinephrine and Vasopressin IV infusions for hemodynamic support.  eICU Interventions  Plan: Will request that the PCCM ground team evaluate the patient at bedside. She may require intubation and mechanical ventilation. D/C Amiodarone PO. Amiodarone IV load and infusion.      Intervention Category Major Interventions: Arrhythmia - evaluation and management;Hypoxemia - evaluation and management  Lysle Dingwall 12/27/2020, 7:59 PM

## 2020-12-27 NOTE — Progress Notes (Signed)
Cross covering ICU physician.    Called to eval pt after being placed on NIV. She appears comfortable. Is on 80% fio2. On norepi and vaso. She endorses improvement in breathing.   She does sound volume up with bilateral coarse bs. ? If she might benefit from crrt for further volume removal.

## 2020-12-27 NOTE — Progress Notes (Signed)
C/o generalized pain.  All prn meds po.  Spoke with [facility] who will relay message to [place] doctor re:  pain management via IV.  Discussed this plan with oncoming nurse Ubaldo Glassing.

## 2020-12-27 NOTE — Progress Notes (Signed)
NAME:  Debra Petersen, MRN:  [id number], DOB:  05/04/1950, LOS: 8 ADMISSION DATE:  12/08/2020 CONSULTATION DATE:  12/23/2020 REFERRING MD:  Tyrell Antonio - TRH CHIEF COMPLAINT: Hypotension, possible sepsis   History of Present Illness:  70 year old woman who presented to [facility] 10/15 for weakness and increasing dizziness x 2 days and palpitations. 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  In ED, patient was tachycardic to 130s with telemetry demonstrating Afib with RVR. Cardizem gtt was started at slow rate, given patient report of mild bradycardia at home rates 40s-60s). Heparin gtt was initiated for [facility]. Troponin was elevated to 1843, presumed secondary to demand ischemia. Patient was admitted to [facility] with Cardiology/Nephrology consults.   After 4-5 second pause noted on tele 10/15 with variable HR (50s to 130s), concern raised for sick sinus syndrome and EP was consulted, given complex management of antiarrhythmics in the setting of CKD. Underwent temp pacer placement 10/16 with subsequent permanent pacemaker placement 10/17.  On 10/18PM, patient had brief period of hypotension prompting peripheral low-dose pressor initiation. Additionally, Cr worsened with concern for ARF superimposed on CKD. PCCM was consulted for hypotension/ARF with concern for sepsis.  Pertinent Medical History:   Past Medical History:  Diagnosis Date   CKD (chronic kidney disease)    Lupus (systemic lupus erythematosus) (HCC)    Wide-complex tachycardia    probable atrial flutter with 1-1 and 2-1 conduction   Significant Hospital Events: Including procedures, antibiotic start and stop dates in addition to other pertinent events   10/19 - PCCM consulted in the setting of hypotension with possible sepsis, low-dose pressor requirement. ECHO with  new pericardial effusion 10/20 - re consulted for recurrent hypotension req pressors. C/o chest pain. Needs HD cath placed. Broadening abx   10/21 chest pain feeling better. Got first HD run this morning 10/22 on Nrb. More confused. On low dose NE.  10/23 on NE, vaso and BiPAP after 1L volume removal iHD overnight   Interim History / Subjective:  Only 1L fluid removal last night with iHD  On BiPAP because of increasingly labored respiratory status   On vaso, NE   BNP is 1500  WBC 19 INR 1.4 from 6.3  Objective:  Blood pressure (!) 111/49, pulse 87, temperature (!) 97.1 F (36.2 C), temperature source Oral, resp. rate 18, height 5\' 3"  (1.6 m), weight 58.3 kg, SpO2 92 %.    Vent Mode: BIPAP;PCV FiO2 (%):  [60 %-80 %] 60 % Set Rate:  [12 bmp-15 bmp] 15 bmp PEEP:  [5 cmH20] 5 cmH20   Intake/Output Summary (Last 24 hours) at 12/27/2020 6440 Last data filed at 12/27/2020 0600 Gross per 24 hour  Intake 376.64 ml  Output 1000 ml  Net -623.36 ml   Filed Weights   12/26/20 0500 12/26/20 0642 12/27/20 0551  Weight: 59.4 kg 59.4 kg 58.3 kg   Physical Examination: General: Ill appearing older adult F reclined in bed NAD Neuro:AAOx3 following commands but drowsy   HEENT:NCAT BiPAP in place anicteric sclera  CV: irir s1s1 no rgm  PULM: Crackles. Diminished basilar sounds. Symmetrical chest expansion  GI: soft ndnt + bowel sounds  Extremities: LUE dependent edema. No acute joint deformity no cyanosis or clubbing  Skin: pale c/d/w. Scattered bruising    Resolved Hospital Problem List:    Assessment & Plan:     Acute metabolic  encephalopathy  ICU delirium  -renal failure, sepsis, frequent night disruptions  P -delirium precautions -encourage sleep wake cycle, PRN melatonin -metabolic correction as below   Acute respiratory failure with hypoxia  -pulmonary edema, small pleural effusions, splinting and atelectasis. Possible superimposed PNA  P -At this point we really  need more aggressive volume removal. I will talk to nephro about CRRT  -BiPAP PRN, HHFNC when off -NPO on BiPAP  -IS, CPT when off BiPAP  -cont abx for possible PNA   Hypotension / possible septic shock  - leukocytosis, elevated PCT but no clear source of infection. Could be CAP vs ?pacemaker infection. Bcx NGTD  -no tamponade with pericardial effusion  -if not infection related, could consider vasoplegia with renal dysfunction  P -cont abx  -trend CBC, fever curve -midodrine -vaso. Titrate NE as needed. In RVR, will change to neo   pAfib  P -dilt, amio -tele monitoring -holding coumadin with coagulopathy below   Coagulopathy -will d/w pharm plans about coumadin   AKI on  CKD V requiring RRT  -hx lupus nephritis, undergoing transplant eval at [facility]  Hypokalemia  P -will talk to neprho RE possible CRRT, we need to get more volume off of this pt and there has been limited fluid removal with iHD.  -ordering 3runs of K given her difficult to manage afib   Pericardial effusion  -new on ECHO 10/19. ?uremic vs post procedural.  -repeat ECHO 10/20 without concern for tamponade  Suspected dysphagia  -SLP eval -- nectar thick, meds with puree -aspiration precautions  -NPO on BiPAP  -if unable to wean from BiPAP would pursue cortrak but hoping with volume removal we can de-escalate resp support    Best Practice: (right click and "Reselect all SmartList Selections" daily)   Diet-nectar thick  / NPO on BiPAP Code Status: Full  VTE ppx: SCD GI ppx: n/a  Goals of care: 10/23 pt and son at bedside  Lines- temp HD cath RIJ   Labs:  CBC: Recent Labs  Lab 12/16/2020 0130 12/22/20 0125 12/23/20 0132 12/24/20 0354 12/24/20 1737 12/25/20 0349 12/26/20 0348 12/27/20 0418  WBC 10.2 11.2* 21.9* 15.4*  --  17.2* 19.0* 19.0*  NEUTROABS 4.5 6.4 15.5* 12.2*  --   --   --   --   HGB 8.1* 8.5* 9.9* 8.8* 8.2* 8.8* 8.5* 8.2*  HCT 24.6* 26.5* 29.9* 26.7* 24.0* 25.9* 24.2* 22.8*  MCV  107.9* 107.7* 107.9* 105.5*  --  102.8* 99.2 97.0  PLT 259 250 273 258  --  283 290 350   Basic Metabolic Panel: Recent Labs  Lab 12/22/20 0125 12/23/20 0132 12/24/20 0354 12/24/20 1737 12/25/20 0349 12/26/20 0348 12/27/20 0418  NA 136 131* 125* 122* 128* 131* 134*  K 4.2 4.6 4.5 4.7 5.3* 4.7 3.3*  CL 103 94* 87*  --  91* 96* 96*  CO2 24 24 22   --  22 23 25   GLUCOSE 151* 100* 283*  --  84 99 94  BUN 31* 34* 40*  --  56* 45* 20  CREATININE 5.40* 5.91* 6.60*  --  7.76* 5.90* 2.69*  CALCIUM 8.5* 9.0 8.4*  --  8.9 9.3 8.7*  MG 1.9 1.7 2.3  --   --  3.2* 2.2  PHOS  --   --   --   --   --  7.9* 3.6   GFR: Estimated Creatinine Clearance: 16.3 mL/min (A) (by C-G formula based on SCr of 2.69 mg/dL (H)). Recent Labs  Lab  12/23/20 1340 12/23/20 1450 12/24/20 0354 12/25/20 0349 12/26/20 0348 12/27/20 0418  PROCALCITON  --  4.91 6.15 17.19  --   --   WBC  --   --  15.4* 17.2* 19.0* 19.0*  LATICACIDVEN 2.0* 1.1  --   --   --   --    Liver Function Tests: Recent Labs  Lab 01/03/2021 0130 12/22/20 0125 12/23/20 0132 12/24/20 0354  AST 12* 20 35 15  ALT 8 9 6 5   ALKPHOS 91 111 224* 165*  BILITOT 0.1* 0.2* 0.3 0.3  PROT 4.8* 5.1* 6.1* 5.4*  ALBUMIN 2.2* 2.3* 2.5* 2.1*   No results for input(s): LIPASE, AMYLASE in the last 168 hours. No results for input(s): AMMONIA in the last 168 hours.  ABG:    Component Value Date/Time   PHART 7.492 (H) 12/24/2020 1737   PCO2ART 27.7 (L) 12/24/2020 1737   PO2ART 124 (H) 12/24/2020 1737   HCO3 21.2 12/24/2020 1737   TCO2 22 12/24/2020 1737   ACIDBASEDEF 2.0 12/24/2020 1737   O2SAT 99.0 12/24/2020 1737    Coagulation Profile: Recent Labs  Lab 12/26/20 0348 12/26/20 1413 12/27/20 0418  INR 2.8* 6.3* 1.4*    Cardiac Enzymes: No results for input(s): CKTOTAL, CKMB, CKMBINDEX, TROPONINI in the last 168 hours.  HbA1C: Hgb A1c MFr Bld  Date/Time Value Ref Range Status  12/24/2020 04:46 PM 5.0 4.8 - 5.6 % Final    Comment:     (NOTE) Pre diabetes:          5.7%-6.4%  Diabetes:              >6.4%  Glycemic control for   <7.0% adults with diabetes   12/26/2020 01:30 AM 4.9 4.8 - 5.6 % Final    Comment:    (NOTE) Pre diabetes:          5.7%-6.4%  Diabetes:              >6.4%  Glycemic control for   <7.0% adults with diabetes    CBG: Recent Labs  Lab 12/26/20 1530 12/26/20 2017 12/27/20 0004 12/27/20 0337 12/27/20 0755  GLUCAP 92 95 92 76 100*    CRITICAL CARE Performed by: Cristal Generous   Total critical care time: 40 minutes  Critical care time was exclusive of separately billable procedures and treating other patients. Critical care was necessary to treat or prevent imminent or life-threatening deterioration.  Critical care was time spent personally by me on the following activities: development of treatment plan with patient and/or surrogate as well as nursing, discussions with consultants, evaluation of patient's response to treatment, examination of patient, obtaining history from patient or surrogate, ordering and performing treatments and interventions, ordering and review of laboratory studies, ordering and review of radiographic studies, pulse oximetry and re-evaluation of patient's condition.  Eliseo Gum MSN, AGACNP-BC [place] for pager 12/27/2020, 8:21 AM

## 2020-12-27 NOTE — Progress Notes (Signed)
Admit: 12/24/2020 LOS: 8  51F CKD5 (CKA Debra Petersen), hx/o SLE and LN, admit with AF+RVR --> symptomatic bradycardia s/p temp PM.   Subjective:  Still req low dose NE + VP 1L HD yesterday, no reported UOP But now on NIPPV with hig FIO2 Son at  bedside, updated  10/22 0701 - 10/23 0700 In: 390 [I.V.:290; IV Piggyback:100] Out: 1000   Filed Weights   12/26/20 0500 12/26/20 0642 12/27/20 0551  Weight: 59.4 kg 59.4 kg 58.3 kg    Scheduled Meds:  amiodarone  400 mg Oral BID   Followed by   Derrill Memo ON 12/31/2020] amiodarone  200 mg Oral BID   Followed by   Derrill Memo ON 01/07/2021] amiodarone  200 mg Oral Daily   Chlorhexidine Gluconate Cloth  6 each Topical Daily   darbepoetin (ARANESP) injection - DIALYSIS  40 mcg Intravenous Q Sat-HD   diltiazem  120 mg Oral Q8H   heparin sodium (porcine)       insulin aspart  1-3 Units Subcutaneous Q4H   midodrine  5 mg Oral TID WC   polyethylene glycol  17 g Oral BID   pravastatin  20 mg Oral Daily   senna-docusate  1 tablet Oral BID   sodium chloride flush  10-40 mL Intracatheter Q12H   Warfarin - Pharmacist Dosing Inpatient   Does not apply q1600   Continuous Infusions:   prismasol BGK 4/2.5      prismasol BGK 4/2.5     sodium chloride     sodium chloride     sodium chloride     ceFEPime (MAXIPIME) IV Stopped (12/26/20 1627)   norepinephrine (LEVOPHED) Adult infusion 4 mcg/min (12/27/20 0800)   potassium chloride 10 mEq (12/27/20 0912)   prismasol BGK 4/2.5     vasopressin 0.03 Units/min (12/27/20 0800)   PRN Meds:.sodium chloride, sodium chloride, food thickener, heparin, melatonin, [DISCONTINUED] ondansetron **OR** ondansetron (ZOFRAN) IV, oxyCODONE-acetaminophen, sodium chloride  Current Labs: reviewed    Physical Exam:  Blood pressure (!) 111/49, pulse 87, temperature (!) 97.1 F (36.2 C), temperature source Oral, resp. rate 18, height 5\' 3"  (1.6 m), weight 58.3 kg, SpO2 92 %. NAD, on BiPAP Nl Rate, irriegular NO rub  Coarse bs  throughout Trace to 1+ LEE Nonfocal R IJ Temp HD cath  A AoCKD5, [name] follows. [facility] KT eval in process.   Outpt plan for PD as RRT.   With progressive AKi and clinically deteriorating started RRT as iHD 12/25/20.  Has R IJ temp HD cath, CCM placed. HD#2 10/22 wit h1L UF  Given ongoing/worsened #5 now on BiPAP start CRRT 10/23: all 4K, no heparin, 138mL/h net negative  AF+RVR and symptomatic bradycardia; hypotension, s/p PPM. On Amio, diltiazem, BPs soft on VP + NE gtt low dose Metabolic acidosis, resolved with HD Anemia of CKD on ESA as outpt, TSAT ok.  Hb 8s.  Transfuse as neeed, ESA q Sat AHRF, Edema + ? PNA, p  P As above, CRRT today today goal fluid removal Daily weights, Daily Renal Panel, Strict I/Os, Avoid nephrotoxins (NSAIDs, judicious IV Contrast)  Medication Issues; Preferred narcotic agents for pain control are hydromorphone, fentanyl, and methadone. Morphine should not be used.  Baclofen should be avoided Avoid oral sodium phosphate and magnesium citrate based laxatives / bowel preps    Debra Grippe MD 12/27/2020, 9:26 AM  Recent Labs  Lab 12/25/20 0349 12/26/20 0348 12/27/20 0418  NA 128* 131* 134*  K 5.3* 4.7 3.3*  CL 91* 96* 96*  CO2  22 23 25   GLUCOSE 84 99 94  BUN 56* 45* 20  CREATININE 7.76* 5.90* 2.69*  CALCIUM 8.9 9.3 8.7*  PHOS  --  7.9* 3.6    Recent Labs  Lab 12/22/20 0125 12/23/20 0132 12/24/20 0354 12/24/20 1737 12/25/20 0349 12/26/20 0348 12/27/20 0418  WBC 11.2* 21.9* 15.4*  --  17.2* 19.0* 19.0*  NEUTROABS 6.4 15.5* 12.2*  --   --   --   --   HGB 8.5* 9.9* 8.8*   < > 8.8* 8.5* 8.2*  HCT 26.5* 29.9* 26.7*   < > 25.9* 24.2* 22.8*  MCV 107.7* 107.9* 105.5*  --  102.8* 99.2 97.0  PLT 250 273 258  --  283 290 212   < > = values in this interval not displayed.

## 2020-12-27 NOTE — Progress Notes (Signed)
Nectar thick consistency juice given to pt per SLP recommendations.  Pt noted to immediately cough.  PO meds/fluids stopped immediately.  Pt was completely alert at time of po initiation.  Will make MD aware and keep pt NPO until further notice.  Will continue to closely monitor.

## 2020-12-27 NOTE — Progress Notes (Signed)
Bed CPT held this round pt feeling too sick to lie flat.

## 2020-12-27 NOTE — Progress Notes (Signed)
Pharmacy Antibiotic Note  Debra Petersen is a 70 y.o. female admitted on 12/27/2020 with pneumonia.  Pharmacy has been consulted for cefepime dosing.  Asked to add vancomycin today for MRSA PCR+, and suspected sepsis.  WBC 19, afebrile. PCT 6.15>17.19. Received 2 hours of HD 10/21, 2.5 hours of HD on 10/22. Vancomycin random came back at 12 this morning - plan for CRRT.   Plan: Change Cefepime  to 2g IV every 12 hours Order vancomycin 750 mg IV every 24 hours (no loading dose given VR 12 today) Monitor renal fx, cx results, clinical pic, LOT  Height: 5\' 3"  (160 cm) Weight: 58.3 kg (128 lb 8.5 oz) IBW/kg (Calculated) : 52.4  Temp (24hrs), Avg:97.3 F (36.3 C), Min:97.1 F (36.2 C), Max:97.6 F (36.4 C)  Recent Labs  Lab 12/23/20 0132 12/23/20 1340 12/23/20 1450 12/24/20 0354 12/25/20 0349 12/26/20 0348 12/26/20 1413 12/27/20 0418  WBC 21.9*  --   --  15.4* 17.2* 19.0*  --  19.0*  CREATININE 5.91*  --   --  6.60* 7.76* 5.90*  --  2.69*  LATICACIDVEN  --  2.0* 1.1  --   --   --   --   --   VANCORANDOM  --   --   --   --   --   --  25 12     Estimated Creatinine Clearance: 16.3 mL/min (A) (by C-G formula based on SCr of 2.69 mg/dL (H)).    Allergies  Allergen Reactions   Codeine Itching    Antimicrobials this admission: Cefepime 10/19 >>  Vancomycin 10/20>>  Dose adjustments this admission: 10/22 VR 25 - no dose given short HD session 10/23: starting CRRT, VR 12 - abx adjusted for CRRT dosing; vanc started without loading dose    Microbiology results: 10/19 BCx: sent  10/16 MRSA PCR: neg 10/16 Surgical PCR MRSA +  Thank you for allowing pharmacy to be a part of this patient's care.  Antonietta Jewel, PharmD, [place] Clinical Pharmacist  Phone: [phone] 12/27/2020 9:32 AM  Please check AMION for all [place] phone numbers After 10:00 PM, call [place] [phone]

## 2020-12-27 NOTE — Progress Notes (Signed)
Pt pulling at BIPAP mask, trialed HHFNC but pt did not tolerate, and appeared to be more labored. Pt placed back on BIPAP, settings adjusted, pt appears more comfortable. Pt still anxious, but doing better. RT will continue to monitor.

## 2020-12-27 NOTE — Progress Notes (Addendum)
eLink Physician-Brief Progress Note Patient Name: Debra Petersen DOB: 1951/02/10 MRN: [id number]   Date of Service  12/27/2020  HPI/Events of Note  Continues to have increased WOB even after HD with 1 Liter fluid removal. Patient has been going on and off BiPAP.  Going back on BiPAP now --> Sat - 96% and RR = 19 back on BiPAP.  eICU Interventions  Plan: Portable CXR STAT. Will ask PCCM ground team to evaluate at bedside.      Intervention Category Major Interventions: Other:  Melva Faux Cornelia Copa 12/27/2020, 6:22 AM

## 2020-12-27 NOTE — Progress Notes (Addendum)
CRRT machine alarming "negative access pressure" multiple times.  Attempted to flush right IJ HDC with resistance met in both red and blue ports.  Unable to draw back bld in both ports.  Dr Jonnie Finner notified of events.  Orders received.  Spoke with vascular IV team that order was placed.  CRRT currently off and capped at this time.  Will continue to closely monitor.  Of note, no problem with the machine and/or [place] prior to this time.  1740:  Attempted to call vascular team again to ask for eta for arrival to implement tpa order with no answer.  Will continue to try and follow up with IV team.  Verified with Dr Jonnie Finner the correct order for tpa of [facility] as "Adult first dose order for central line tpa catheter clearance".

## 2020-12-27 NOTE — Progress Notes (Signed)
Cross covering ICU physician.   Called by elink to eval pt for RN with request of sedation for increased rr. Pt appears comfortable on NIV but settings 5/5 changed to 10/8 with improvement in resp rate. Son at bedside updated.   Crrt was off after clotting and pt's hr elevated in 120-130's irreg. Will we resume crrt as soon as able. Giving amio iv at this time and give low dose dilaudid for comfort at this time.   Son will discuss with pt and family about intubation should that be required.   RN at bedside.

## 2020-12-27 NOTE — Plan of Care (Signed)

## 2020-12-27 NOTE — Progress Notes (Signed)
ANTICOAGULATION CONSULT NOTE - Follow Up Consult  Pharmacy Consult for Warfarin Indication: atrial fibrillation  Allergies  Allergen Reactions   Codeine Itching    Patient Measurements: Height: 5\' 3"  (160 cm) Weight: 58.3 kg (128 lb 8.5 oz) IBW/kg (Calculated) : 52.4  Vital Signs: Temp: 97.1 F (36.2 C) (10/23 0758) Temp Source: Oral (10/23 0758) BP: 111/49 (10/23 0800) Pulse Rate: 87 (10/23 0731)  Labs: Recent Labs    12/25/20 0349 12/26/20 0348 12/26/20 1413 12/27/20 0418  HGB 8.8* 8.5*  --  8.2*  HCT 25.9* 24.2*  --  22.8*  PLT 283 290  --  212  LABPROT  --  29.7* 55.4* 17.2*  INR  --  2.8* 6.3* 1.4*  CREATININE 7.76* 5.90*  --  2.69*     Estimated Creatinine Clearance: 16.3 mL/min (A) (by C-G formula based on SCr of 2.69 mg/dL (H)).   Assessment: 70 year old female to begin warfarin for Afib, s/p PPM this admission.   INR increased to 6.3 last night after one dose of warfarin - was reversed with vitamin K 5 mg. INR now down to 1.4 this morning. Hgb 8.2, plt 212. No s/sx of bleeding.  Goal of Therapy:  INR 2-3 Monitor platelets by anticoagulation protocol: Yes   Plan:  Warfarin 1 mg tonight given INR jump with warfarin 5 mg previously in the setting of recent PPM Daily INR  Antonietta Jewel, PharmD, [place] Pharmacist  Phone: [phone] 12/27/2020 9:10 AM  Please check AMION for all [place] phone numbers After 10:00 PM, call [place] [phone]

## 2020-12-28 LAB — CBC
HCT: 20 % — ABNORMAL LOW (ref 36.0–46.0)
Hemoglobin: 7.3 g/dL — ABNORMAL LOW (ref 12.0–15.0)
MCH: 34.8 pg — ABNORMAL HIGH (ref 26.0–34.0)
MCHC: 36.5 g/dL — ABNORMAL HIGH (ref 30.0–36.0)
MCV: 95.2 fL (ref 80.0–100.0)
Platelets: 186 10*3/uL (ref 150–400)
RBC: 2.1 MIL/uL — ABNORMAL LOW (ref 3.87–5.11)
RDW: 15.5 % (ref 11.5–15.5)
WBC: 17.5 10*3/uL — ABNORMAL HIGH (ref 4.0–10.5)
nRBC: 2.7 % — ABNORMAL HIGH (ref 0.0–0.2)

## 2020-12-28 LAB — RENAL FUNCTION PANEL
Albumin: 1.6 g/dL — ABNORMAL LOW (ref 3.5–5.0)
Albumin: 1.7 g/dL — ABNORMAL LOW (ref 3.5–5.0)
Anion gap: 11 (ref 5–15)
Anion gap: 10 (ref 5–15)
BUN: 34 mg/dL — ABNORMAL HIGH (ref 8–23)
BUN: 25 mg/dL — ABNORMAL HIGH (ref 8–23)
CO2: 23 mmol/L (ref 22–32)
CO2: 25 mmol/L (ref 22–32)
Calcium: 8.8 mg/dL — ABNORMAL LOW (ref 8.9–10.3)
Calcium: 9 mg/dL (ref 8.9–10.3)
Chloride: 99 mmol/L (ref 98–111)
Chloride: 100 mmol/L (ref 98–111)
Creatinine, Ser: 3.53 mg/dL — ABNORMAL HIGH (ref 0.44–1.00)
Creatinine, Ser: 2.5 mg/dL — ABNORMAL HIGH (ref 0.44–1.00)
GFR, Estimated: 13 mL/min — ABNORMAL LOW (ref >60)
GFR, Estimated: 20 mL/min — ABNORMAL LOW (ref >60)
Glucose, Bld: 123 mg/dL — ABNORMAL HIGH (ref 70–99)
Glucose, Bld: 116 mg/dL — ABNORMAL HIGH (ref 70–99)
Phosphorus: 4.6 mg/dL (ref 2.5–4.6)
Phosphorus: 3.3 mg/dL (ref 2.5–4.6)
Potassium: 4.5 mmol/L (ref 3.5–5.1)
Potassium: 4.4 mmol/L (ref 3.5–5.1)
Sodium: 133 mmol/L — ABNORMAL LOW (ref 135–145)
Sodium: 135 mmol/L (ref 135–145)

## 2020-12-28 LAB — PHOSPHORUS: Phosphorus: 4.4 mg/dL (ref 2.5–4.6)

## 2020-12-28 LAB — BASIC METABOLIC PANEL
Anion gap: 11 (ref 5–15)
BUN: 33 mg/dL — ABNORMAL HIGH (ref 8–23)
CO2: 24 mmol/L (ref 22–32)
Calcium: 8.8 mg/dL — ABNORMAL LOW (ref 8.9–10.3)
Chloride: 99 mmol/L (ref 98–111)
Creatinine, Ser: 3.49 mg/dL — ABNORMAL HIGH (ref 0.44–1.00)
GFR, Estimated: 14 mL/min — ABNORMAL LOW (ref >60)
Glucose, Bld: 122 mg/dL — ABNORMAL HIGH (ref 70–99)
Potassium: 4.5 mmol/L (ref 3.5–5.1)
Sodium: 134 mmol/L — ABNORMAL LOW (ref 135–145)

## 2020-12-28 LAB — GLUCOSE, CAPILLARY
Glucose-Capillary: 104 mg/dL — ABNORMAL HIGH (ref 70–99)
Glucose-Capillary: 110 mg/dL — ABNORMAL HIGH (ref 70–99)
Glucose-Capillary: 104 mg/dL — ABNORMAL HIGH (ref 70–99)
Glucose-Capillary: 93 mg/dL (ref 70–99)

## 2020-12-28 LAB — CULTURE, BLOOD (ROUTINE X 2)
Culture: NO GROWTH
Culture: NO GROWTH
Special Requests: ADEQUATE
Special Requests: ADEQUATE

## 2020-12-28 LAB — MAGNESIUM: Magnesium: 2.5 mg/dL — ABNORMAL HIGH (ref 1.7–2.4)

## 2020-12-28 LAB — PROTIME-INR
INR: 1.3 — ABNORMAL HIGH (ref 0.8–1.2)
Prothrombin Time: 16 seconds — ABNORMAL HIGH (ref 11.4–15.2)

## 2020-12-28 LAB — HEPARIN LEVEL (UNFRACTIONATED): Heparin Unfractionated: <0.1 IU/mL — ABNORMAL LOW (ref 0.30–0.70)

## 2020-12-28 MED ORDER — HEPARIN (PORCINE) 25000 UT/250ML-% IV SOLN
1550.0000 [IU]/h | INTRAVENOUS | Status: DC
  Administered 2020-12-28: 800 [IU]/h via INTRAVENOUS
  Administered 2020-12-29: 1050 [IU]/h via INTRAVENOUS
  Administered 2020-12-30: 1400 [IU]/h via INTRAVENOUS
  Administered 2020-12-31: 1550 [IU]/h via INTRAVENOUS
  Filled 2020-12-28 (×4): qty 250

## 2020-12-28 MED ORDER — DARBEPOETIN ALFA 200 MCG/0.4ML IJ SOSY: 200.0000 ug | PREFILLED_SYRINGE | INTRAMUSCULAR | Status: DC

## 2020-12-28 NOTE — Progress Notes (Signed)
NAME:  Debra Petersen, MRN:  [id number], DOB:  Feb 11, 1951, LOS: 9 ADMISSION DATE:  12/08/2020 CONSULTATION DATE:  12/23/2020 REFERRING MD:  Tyrell Antonio - [facility] CHIEF COMPLAINT: Hypotension, possible sepsis   History of Present Illness:  70 year old woman who presented to [facility] 10/15 for weakness and increasing dizziness x 2 days and palpitations. 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  In ED, patient was tachycardic to 130s with telemetry demonstrating Afib with RVR. Cardizem gtt was started at slow rate, given patient report of mild bradycardia at home rates 40s-60s). Heparin gtt was initiated for [facility]. Troponin was elevated to 1843, presumed secondary to demand ischemia. Patient was admitted to [facility] with Cardiology/Nephrology consults.   After 4-5 second pause noted on tele 10/15 with variable HR (50s to 130s), concern raised for sick sinus syndrome and EP was consulted, given complex management of antiarrhythmics in the setting of CKD. Underwent temp pacer placement 10/16 with subsequent permanent pacemaker placement 10/17.  On 10/18PM, patient had brief period of hypotension prompting peripheral low-dose pressor initiation. Additionally, Cr worsened with concern for ARF superimposed on CKD. PCCM was consulted for hypotension/ARF with concern for sepsis.  Pertinent Medical History:   Past Medical History:  Diagnosis Date   CKD (chronic kidney disease)    Lupus (systemic lupus erythematosus) (HCC)    Wide-complex tachycardia    probable atrial flutter with 1-1 and 2-1 conduction   Significant Hospital Events: Including procedures, antibiotic start and stop dates in addition to other pertinent events   10/19 - PCCM consulted in the setting of hypotension with possible sepsis, low-dose pressor requirement. ECHO with  new pericardial effusion 10/20 - re consulted for recurrent hypotension req pressors. C/o chest pain. Needs HD cath placed. Broadening abx   10/21 chest pain feeling better. Got first HD run this morning 10/22 on Nrb. More confused. On low dose NE.  10/23 on NE, vaso and BiPAP after 1L volume removal iHD overnight  10/24 dialysis catheter repositioned.  Patient did not receive adequate ultrafiltration overnight  Interim History / Subjective:  Despite inadequate fluid removal overnight, patient states that she feels better.  Objective:  Blood pressure 105/72, pulse 88, temperature (!) 97.4 F (36.3 C), temperature source Axillary, resp. rate 20, height 5\' 3"  (1.6 m), weight 67.9 kg, SpO2 95 %.    Vent Mode: BIPAP;PCV FiO2 (%):  [70 %] 70 % Set Rate:  [15 bmp] 15 bmp PEEP:  [8 cmH20] 8 cmH20   Intake/Output Summary (Last 24 hours) at 12/28/2020 0925 Last data filed at 12/28/2020 0900 Gross per 24 hour  Intake 1201.31 ml  Output 1692 ml  Net -490.69 ml    Filed Weights   12/26/20 0642 12/27/20 0551 12/28/20 0500  Weight: 59.4 kg 58.3 kg 67.9 kg   Physical Examination: General: Frail.  In no distress Neuro:AAOx3 following commands with generalized weakness HEENT: BiPAP with good seal.  Sclera are pale. CV: irir s1s1 no rgm  PULM: Fine crackles at right base coarse crackles at the left base. GI: soft ndnt + bowel sounds  Extremities: Bilateral edema predominantly in dependent zones Skin: pale c/d/w. Scattered bruising    Resolved Hospital Problem List:    Assessment & Plan:   Critically ill due to acute respiratory failure with hypoxia requiring noninvasive ventilation due to volume overload and bacterial pneumonia Critically ill due to  distributive shock presumably secondary to bacterial pneumonia Critically ill due to atrial fibrillation with rapid ventricular response requiring titration of amiodarone Critically ill due to acute kidney injury superimposed on stage V  kidney disease requiring CRRT Anticoagulation related coagulopathy Pericardial effusion without tamponade Dysphagia  Plan:  -Continue CRRT goal fluid removal 100-150 net fluid removal -Wean vasopressors starting with vasopressin. -Serial echo shows only small pericardial effusion and normal LV function. -Trial off BiPAP as tolerated -Continue IV amiodarone for now until cleared for oral intake. -Start IV heparin systemically for stroke prevention. -Ongoing speech therapy  Best Practice: (right click and "Reselect all SmartList Selections" daily)   Diet-nectar thick  / NPO on BiPAP Code Status: Full  VTE ppx: SCD GI ppx: n/a  Goals of care: 10/24 pt and son at bedside  Lines- temp HD cath RIJ   Labs:  CBC: Recent Labs  Lab 12/22/20 0125 12/23/20 0132 12/24/20 0354 12/24/20 1737 12/25/20 0349 12/26/20 0348 12/27/20 0418 12/28/20 0543  WBC 11.2* 21.9* 15.4*  --  17.2* 19.0* 19.0* 17.5*  NEUTROABS 6.4 15.5* 12.2*  --   --   --   --   --   HGB 8.5* 9.9* 8.8* 8.2* 8.8* 8.5* 8.2* 7.3*  HCT 26.5* 29.9* 26.7* 24.0* 25.9* 24.2* 22.8* 20.0*  MCV 107.7* 107.9* 105.5*  --  102.8* 99.2 97.0 95.2  PLT 250 273 258  --  283 290 212 924    Basic Metabolic Panel: Recent Labs  Lab 12/23/20 0132 12/24/20 0354 12/24/20 1737 12/25/20 0349 12/26/20 0348 12/27/20 0418 12/28/20 0543  NA 131* 125* 122* 128* 131* 134* 134*  133*  K 4.6 4.5 4.7 5.3* 4.7 3.3* 4.5  4.5  CL 94* 87*  --  91* 96* 96* 99  99  CO2 24 22  --  22 23 25 24  23   GLUCOSE 100* 283*  --  84 99 94 122*  123*  BUN 34* 40*  --  56* 45* 20 33*  34*  CREATININE 5.91* 6.60*  --  7.76* 5.90* 2.69* 3.49*  3.53*  CALCIUM 9.0 8.4*  --  8.9 9.3 8.7* 8.8*  8.8*  MG 1.7 2.3  --   --  3.2* 2.2 2.5*  PHOS  --   --   --   --  7.9* 3.6 4.4  4.6    GFR: Estimated Creatinine Clearance: 14.1 mL/min (A) (by C-G formula based on SCr of 3.49 mg/dL (H)). Recent Labs  Lab 12/23/20 1340 12/23/20 1450 12/24/20 0354  12/25/20 0349 12/26/20 0348 12/27/20 0418 12/28/20 0543  PROCALCITON  --  4.91 6.15 17.19  --   --   --   WBC  --   --  15.4* 17.2* 19.0* 19.0* 17.5*  LATICACIDVEN 2.0* 1.1  --   --   --   --   --     Liver Function Tests: Recent Labs  Lab 12/22/20 0125 12/23/20 0132 12/24/20 0354 12/28/20 0543  AST 20 35 15  --   ALT 9 6 5   --   ALKPHOS 111 224* 165*  --   BILITOT 0.2* 0.3 0.3  --   PROT 5.1* 6.1* 5.4*  --   ALBUMIN 2.3* 2.5* 2.1* 1.6*    No results for input(s): LIPASE, AMYLASE in the last 168 hours. No results for input(s): AMMONIA in the last 168 hours.  ABG:    Component Value Date/Time   PHART 7.492 (H) 12/24/2020 1737   PCO2ART 27.7 (L) 12/24/2020 1737  PO2ART 124 (H) 12/24/2020 1737   HCO3 21.2 12/24/2020 1737   TCO2 22 12/24/2020 1737   ACIDBASEDEF 2.0 12/24/2020 1737   O2SAT 99.0 12/24/2020 1737     Coagulation Profile: Recent Labs  Lab 12/26/20 0348 12/26/20 1413 12/27/20 0418 12/28/20 0543  INR 2.8* 6.3* 1.4* 1.3*     Cardiac Enzymes: No results for input(s): CKTOTAL, CKMB, CKMBINDEX, TROPONINI in the last 168 hours.  HbA1C: Hgb A1c MFr Bld  Date/Time Value Ref Range Status  12/24/2020 04:46 PM 5.0 4.8 - 5.6 % Final    Comment:    (NOTE) Pre diabetes:          5.7%-6.4%  Diabetes:              >6.4%  Glycemic control for   <7.0% adults with diabetes   12/12/2020 01:30 AM 4.9 4.8 - 5.6 % Final    Comment:    (NOTE) Pre diabetes:          5.7%-6.4%  Diabetes:              >6.4%  Glycemic control for   <7.0% adults with diabetes    CBG: Recent Labs  Lab 12/27/20 1611 12/27/20 1945 12/27/20 2332 12/28/20 0410 12/28/20 0813  GLUCAP 98 87 95 104* 110*     CRITICAL CARE Performed by: Kipp Brood   Total critical care time: 40 minutes  Critical care time was exclusive of separately billable procedures and treating other patients. Critical care was necessary to treat or prevent imminent or life-threatening  deterioration.  Critical care was time spent personally by me on the following activities: development of treatment plan with patient and/or surrogate as well as nursing, discussions with consultants, evaluation of patient's response to treatment, examination of patient, obtaining history from patient or surrogate, ordering and performing treatments and interventions, ordering and review of laboratory studies, ordering and review of radiographic studies, pulse oximetry and re-evaluation of patient's condition.  Kipp Brood, MD [facility] ICU Physician [place]  Pager: [phone] Or Epic Secure Chat After hours: [phone].  12/28/2020, 9:37 AM     12/28/2020, 9:25 AM

## 2020-12-28 NOTE — Progress Notes (Signed)
Subjective:  615 UOP-  got started on CRRT overnight after vascath was replaced -  ended up 284 negative -  set for 100 per hour negative    Objective Vital signs in last 24 hours: Vitals:   12/28/20 0435 12/28/20 0500 12/28/20 0600 12/28/20 0807  BP: (!) (P) 87/50 (!) 101/51 108/66   Pulse:      Resp: 15 19 18    Temp:      TempSrc:      SpO2:    95%  Weight:  67.9 kg    Height:       Weight change: 9.6 kg  Intake/Output Summary (Last 24 hours) at 12/28/2020 0488 Last data filed at 12/28/2020 0600 Gross per 24 hour  Intake 1128.89 ml  Output 1426 ml  Net -297.11 ml    Assessment/ Plan: Pt is a 70 y.o. yo female with stage 5 CKD at baseline who was admitted on 12/05/2020 with Afib/RVR- then brady-  req pacemaker  Assessment/Plan: 1. Renal-  stage 5 CKD at baseline-  has crossed the threshold to being dialysis requiring this admit-  ESRD-   did IHD times 2 with minimal volume removal-  then decision was made to do CRRT yesterday to make more of an impact with volume -  attempting 100 per hour negative 2. HTN/vol-   has become more of an issue and also with hemodynamic instability-  started on CRRT last night-  removing 100 per hour-  hopefully to decrease O2 req-  nursing will inc UF as able 3. Anemia- complicating factor-  will inc ESA-  may need transfusion before said and done-  would prefer not to give IV iron given question of sepsis on vanc and cefepime 4. Secondary hyperparathyroidism-  phos OK 5. Elytes-  all 4 K bath-  k 4.5 today    Louis Meckel    Labs: Basic Metabolic Panel: Recent Labs  Lab 12/26/20 0348 12/27/20 0418 12/28/20 0543  NA 131* 134* 134*  133*  K 4.7 3.3* 4.5  4.5  CL 96* 96* 99  99  CO2 23 25 24  23   GLUCOSE 99 94 122*  123*  BUN 45* 20 33*  34*  CREATININE 5.90* 2.69* 3.49*  3.53*  CALCIUM 9.3 8.7* 8.8*  8.8*  PHOS 7.9* 3.6 4.4  4.6   Liver Function Tests: Recent Labs  Lab 12/22/20 0125 12/23/20 0132  12/24/20 0354 12/28/20 0543  AST 20 35 15  --   ALT 9 6 5   --   ALKPHOS 111 224* 165*  --   BILITOT 0.2* 0.3 0.3  --   PROT 5.1* 6.1* 5.4*  --   ALBUMIN 2.3* 2.5* 2.1* 1.6*   No results for input(s): LIPASE, AMYLASE in the last 168 hours. No results for input(s): AMMONIA in the last 168 hours. CBC: Recent Labs  Lab 12/22/20 0125 12/23/20 0132 12/24/20 0354 12/24/20 1737 12/25/20 0349 12/26/20 0348 12/27/20 0418 12/28/20 0543  WBC 11.2* 21.9* 15.4*  --  17.2* 19.0* 19.0* 17.5*  NEUTROABS 6.4 15.5* 12.2*  --   --   --   --   --   HGB 8.5* 9.9* 8.8*   < > 8.8* 8.5* 8.2* 7.3*  HCT 26.5* 29.9* 26.7*   < > 25.9* 24.2* 22.8* 20.0*  MCV 107.7* 107.9* 105.5*  --  102.8* 99.2 97.0 95.2  PLT 250 273 258  --  283 290 212 186   < > = values in this interval not displayed.  Cardiac Enzymes: No results for input(s): CKTOTAL, CKMB, CKMBINDEX, TROPONINI in the last 168 hours. CBG: Recent Labs  Lab 12/27/20 1611 12/27/20 1945 12/27/20 2332 12/28/20 0410 12/28/20 0813  GLUCAP 98 87 95 104* 110*    Iron Studies: No results for input(s): IRON, TIBC, TRANSFERRIN, FERRITIN in the last 72 hours. Studies/Results: DG CHEST PORT 1 VIEW  Result Date: 12/27/2020 CLINICAL DATA:  Central line placement EXAM: PORTABLE CHEST 1 VIEW COMPARISON:  12/27/2020 FINDINGS: Right internal jugular Vas-Cath placed with the tip in the right innominate vein. No pneumothorax. Bilateral airspace disease, stable or slightly worsened since prior study. Small bilateral pleural effusions. Heart is normal size. Left pacer remains in place, unchanged. IMPRESSION: Right internal jugular Vas-Cath tip in the right innominate vein. No pneumothorax. Patchy bilateral airspace disease, stable or slightly worsened since prior study. Small bilateral layering effusions. Electronically Signed   By: Rolm Baptise M.D.   On: 12/27/2020 23:59   DG CHEST PORT 1 VIEW  Result Date: 12/27/2020 CLINICAL DATA:  70 year old female with  respiratory distress. EXAM: PORTABLE CHEST 1 VIEW COMPARISON:  Portable chest 12/24/2020 and earlier. FINDINGS: Portable AP upright view at 0630 hours. Stable right chest dual lumen dialysis type catheter. Stable lung volumes and mediastinal contours. Stable left chest cardiac pacemaker. Bilateral perihilar opacity, more symmetric now, and with more the reticulonodular, less airspace appearance compared to 3 days ago. Confluent opacity persists at the right lung base, and there is left greater than right lung base veiling opacity. No pneumothorax. No air bronchograms. Paucity of bowel gas in the upper abdomen. IMPRESSION: Ventilation not significantly changed from 3 days ago, with extensive bilateral perihilar reticulonodular opacity superimposed on evidence of pleural effusions with lower lobe collapse or consolidation. Favor asymmetric pulmonary edema although bilateral pneumonia is possible. Electronically Signed   By: Genevie Ann M.D.   On: 12/27/2020 06:48   Medications: Infusions:   prismasol BGK 4/2.5 400 mL/hr at 12/28/20 0410    prismasol BGK 4/2.5 200 mL/hr at 12/28/20 0410   sodium chloride     sodium chloride     sodium chloride     amiodarone 30 mg/hr (12/28/20 0744)   ceFEPime (MAXIPIME) IV Stopped (12/27/20 2349)   heparin 10,000 units/ 20 mL infusion syringe 250 Units/hr (12/28/20 0600)   norepinephrine (LEVOPHED) Adult infusion 2 mcg/min (12/28/20 0600)   prismasol BGK 4/2.5 1,000 mL/hr at 12/28/20 0410   vancomycin Stopped (12/27/20 1314)   vasopressin 0.03 Units/min (12/28/20 0600)    Scheduled Medications:  chlorhexidine  15 mL Mouth Rinse BID   Chlorhexidine Gluconate Cloth  6 each Topical Daily   darbepoetin (ARANESP) injection - NON-DIALYSIS  40 mcg Subcutaneous Q Sun-1800   diltiazem  120 mg Oral Q8H   insulin aspart  1-3 Units Subcutaneous Q4H   mouth rinse  15 mL Mouth Rinse q12n4p   midodrine  5 mg Oral TID WC   polyethylene glycol  17 g Oral BID   pravastatin  20 mg  Oral Daily   senna-docusate  1 tablet Oral BID   sodium chloride flush  10-40 mL Intracatheter Q12H   warfarin  1 mg Oral ONCE-1600   Warfarin - Pharmacist Dosing Inpatient   Does not apply q1600    have reviewed scheduled and prn medications.  Physical Exam: General: frail-  eyes wide open-  on bipap-  anxiety.distress Heart: RRR Lungs: poor effort, CBS bilat Abdomen: distended-  non tender Extremities: positive peripheral edema Dialysis Access: right femoral HD cath placed  10/24    12/28/2020,8:21 AM  LOS: 9 days

## 2020-12-28 NOTE — Progress Notes (Signed)
OT Cancellation Note  Patient Details Name: Debra Petersen MRN: [id number] DOB: 01-Feb-1951   Cancelled Treatment:    Reason Eval/Treat Not Completed: Patient not medically ready Pt with recent decline and started on CRRT. Per RN, pt is responding well to CRRT; requests therapy hold today and check back tomorrow for evaluations.  Layla Maw 12/28/2020, 9:48 AM

## 2020-12-28 NOTE — Progress Notes (Signed)
PT Cancellation Note  Patient Details Name: Debra Petersen MRN: [id number] DOB: [birth year]   Cancelled Treatment:    Reason Eval/Treat Not Completed: Medical issues which prohibited therapy this morning. Pt with recent decline requiring transition to bipap due to increased WOB on 10/23 and replacement of venous cath for CRRT overnight. Per discussion with OT, RN requesting therapies hold until pt more stable and check back tomorrow.   West Carbo, PT, DPT   Acute Rehabilitation Department Pager #: [phone]   Sandra Cockayne 12/28/2020, 11:36 AM

## 2020-12-28 NOTE — Progress Notes (Addendum)
Progress Note  Patient Name: Debra Petersen Date of Encounter: 12/28/2020  [facility] Cardiologist: Dr. Caryl Comes  Subjective   Reproducible chest wall discomfort, + SOB, on BIPAP  Inpatient Medications    Scheduled Meds:  chlorhexidine  15 mL Mouth Rinse BID   Chlorhexidine Gluconate Cloth  6 each Topical Daily   darbepoetin (ARANESP) injection - NON-DIALYSIS  40 mcg Subcutaneous Q Sun-1800   diltiazem  120 mg Oral Q8H   insulin aspart  1-3 Units Subcutaneous Q4H   mouth rinse  15 mL Mouth Rinse q12n4p   midodrine  5 mg Oral TID WC   polyethylene glycol  17 g Oral BID   pravastatin  20 mg Oral Daily   senna-docusate  1 tablet Oral BID   sodium chloride flush  10-40 mL Intracatheter Q12H   warfarin  1 mg Oral ONCE-1600   Warfarin - Pharmacist Dosing Inpatient   Does not apply q1600   Continuous Infusions:   prismasol BGK 4/2.5 400 mL/hr at 12/28/20 0410    prismasol BGK 4/2.5 200 mL/hr at 12/28/20 0410   sodium chloride     sodium chloride     sodium chloride     amiodarone 30 mg/hr (12/28/20 0744)   ceFEPime (MAXIPIME) IV Stopped (12/27/20 2349)   heparin 10,000 units/ 20 mL infusion syringe 250 Units/hr (12/28/20 0600)   norepinephrine (LEVOPHED) Adult infusion 2 mcg/min (12/28/20 0600)   prismasol BGK 4/2.5 1,000 mL/hr at 12/28/20 0410   vancomycin Stopped (12/27/20 1314)   vasopressin 0.03 Units/min (12/28/20 0600)   PRN Meds: sodium chloride, sodium chloride, food thickener, heparin, HYDROmorphone (DILAUDID) injection, melatonin, [DISCONTINUED] ondansetron **OR** ondansetron (ZOFRAN) IV, oxyCODONE-acetaminophen, sodium chloride   Vital Signs    Vitals:   12/28/20 0435 12/28/20 0500 12/28/20 0600 12/28/20 0807  BP: (!) (P) 87/50 (!) 101/51 108/66   Pulse:      Resp: 15 19 18    Temp:      TempSrc:      SpO2:    95%  Weight:  67.9 kg    Height:        Intake/Output Summary (Last 24 hours) at 12/28/2020 0837 Last data filed at 12/28/2020 0600 Gross per  24 hour  Intake 1128.89 ml  Output 1426 ml  Net -297.11 ml   Last 3 Weights 12/28/2020 12/27/2020 12/27/2020  Weight (lbs) 149 lb 11.1 oz 128 lb 8.5 oz (No Data)  Weight (kg) 67.9 kg 58.3 kg (No Data)      Telemetry    AFib, rates 80's-100's, infrequent V paced beat - Personally Reviewed  ECG    No new EKGs - Personally Reviewed  Physical Exam   GEN: looks older then her age, chronically ill in appearance, comfortable on BIPAP Neck: No JVD Cardiac: irreg-irreg, no murmurs, rubs, or gallops.  Respiratory: diminished at the bases GI: Soft, nontender, non-distended  MS: No edema; No deformity, advanced atrophy for age Neuro:  Nonfocal  Psych: Normal affect   Pacer site: dressing is clean and dry, no hematoma  Labs    High Sensitivity Troponin:   Recent Labs  Lab 12/18/2020 1626 12/31/2020 1940 12/29/2020 0439 12/18/2020 0812  TROPONINIHS 1,843* 1,658* 1,457* 1,294*     Chemistry Recent Labs  Lab 12/22/20 0125 12/23/20 0132 12/24/20 0354 12/24/20 1737 12/26/20 0348 12/27/20 0418 12/28/20 0543  NA 136 131* 125*   < > 131* 134* 134*  133*  K 4.2 4.6 4.5   < > 4.7 3.3* 4.5  4.5  CL  103 94* 87*   < > 96* 96* 99  99  CO2 24 24 22    < > 23 25 24  23   GLUCOSE 151* 100* 283*   < > 99 94 122*  123*  BUN 31* 34* 40*   < > 45* 20 33*  34*  CREATININE 5.40* 5.91* 6.60*   < > 5.90* 2.69* 3.49*  3.53*  CALCIUM 8.5* 9.0 8.4*   < > 9.3 8.7* 8.8*  8.8*  MG 1.9 1.7 2.3  --  3.2* 2.2 2.5*  PROT 5.1* 6.1* 5.4*  --   --   --   --   ALBUMIN 2.3* 2.5* 2.1*  --   --   --  1.6*  AST 20 35 15  --   --   --   --   ALT 9 6 5   --   --   --   --   ALKPHOS 111 224* 165*  --   --   --   --   BILITOT 0.2* 0.3 0.3  --   --   --   --   GFRNONAA 8* 7* 6*   < > 7* 19* 14*  13*  ANIONGAP 9 13 16*   < > 12 13 11  11    < > = values in this interval not displayed.    Lipids No results for input(s): CHOL, TRIG, HDL, LABVLDL, LDLCALC, CHOLHDL in the last 168 hours.  Hematology Recent Labs   Lab 12/26/20 0348 12/27/20 0418 12/28/20 0543  WBC 19.0* 19.0* 17.5*  RBC 2.44* 2.35* 2.10*  HGB 8.5* 8.2* 7.3*  HCT 24.2* 22.8* 20.0*  MCV 99.2 97.0 95.2  MCH 34.8* 34.9* 34.8*  MCHC 35.1 36.0 36.5*  RDW 15.1 14.9 15.5  PLT 290 212 186   Thyroid No results for input(s): TSH, FREET4 in the last 168 hours.  BNP Recent Labs  Lab 12/23/20 0132 12/24/20 0354 12/27/20 0418  BNP 1,454.2* 1,160.9* 1,561.2*    DDimer No results for input(s): DDIMER in the last 168 hours.   Radiology    DG CHEST PORT 1 VIEW  Result Date: 12/27/2020 CLINICAL DATA:  Central line placement EXAM: PORTABLE CHEST 1 VIEW COMPARISON:  12/27/2020 FINDINGS: Right internal jugular Vas-Cath placed with the tip in the right innominate vein. No pneumothorax. Bilateral airspace disease, stable or slightly worsened since prior study. Small bilateral pleural effusions. Heart is normal size. Left pacer remains in place, unchanged. IMPRESSION: Right internal jugular Vas-Cath tip in the right innominate vein. No pneumothorax. Patchy bilateral airspace disease, stable or slightly worsened since prior study. Small bilateral layering effusions. Electronically Signed   By: Rolm Baptise M.D.   On: 12/27/2020 23:59   DG CHEST PORT 1 VIEW  Result Date: 12/27/2020 CLINICAL DATA:  70 year old female with respiratory distress. EXAM: PORTABLE CHEST 1 VIEW COMPARISON:  Portable chest 12/24/2020 and earlier. FINDINGS: Portable AP upright view at 0630 hours. Stable right chest dual lumen dialysis type catheter. Stable lung volumes and mediastinal contours. Stable left chest cardiac pacemaker. Bilateral perihilar opacity, more symmetric now, and with more the reticulonodular, less airspace appearance compared to 3 days ago. Confluent opacity persists at the right lung base, and there is left greater than right lung base veiling opacity. No pneumothorax. No air bronchograms. Paucity of bowel gas in the upper abdomen. IMPRESSION: Ventilation  not significantly changed from 3 days ago, with extensive bilateral perihilar reticulonodular opacity superimposed on evidence of pleural effusions with lower lobe collapse  or consolidation. Favor asymmetric pulmonary edema although bilateral pneumonia is possible. Electronically Signed   By: Genevie Ann M.D.   On: 12/27/2020 06:48    Cardiac Studies    12/24/20: TTE (limited) IMPRESSIONS   1. Left ventricular ejection fraction, by estimation, is 65 to 70%. The  left ventricle has hyperdynamic function. The left ventricle has no  regional wall motion abnormalities.   2. There is a small circumferential pericardial effusion. 20% mitral E  inflow velocity respirophasic variation (likely not significant). IVC is  dilated but collapses normally with inspiration. There is no RV diastolic  collapse. This study is not  suggestive of tamponade.   3. The aortic valve is tricuspid.   4. The inferior vena cava is dilated in size with >50% respiratory  variability, suggesting right atrial pressure of 8 mmHg.   5. Limited echo with no color doppler.   6. The patient was in atrial fibrillation.    12/23/20: TTE (limited) IMPRESSIONS   1. Left ventricular ejection fraction, by estimation, is 60 to 65%. The  left ventricle has normal function. The left ventricle has no regional  wall motion abnormalities.   2. Right ventricular systolic function is hyperdynamic. The right  ventricular size is normal.   3. There is a small circumferential pericardial effusion is present.  There is lack of full expansion of the right ventricle; this could be an  early sign of increased pericardial pressure. Hyperdynamic septum with no  evidence of ventricular  interdependence.   4. The mitral valve is normal in structure. No evidence of mitral valve  regurgitation. No evidence of mitral stenosis.   5. The aortic valve is normal in structure. Aortic valve regurgitation is  not visualized. No aortic stenosis is present.    6. The inferior vena cava is normal in size with greater than 50%  respiratory variability, suggesting right atrial pressure of 3 mmHg.  Conclusion(s)/Recommendation(s): Due to findings of early increased  pericardial pressure, a follow up echocardiogram is recommended in  tomorrow.    12/15/2020: TTE IMPRESSIONS   1. Left ventricular ejection fraction, by estimation, is 65 to 70%. The  left ventricle has normal function. The left ventricle has no regional  wall motion abnormalities. Left ventricular diastolic parameters are  consistent with Grade I diastolic  dysfunction (impaired relaxation).   2. Right ventricular systolic function is normal. The right ventricular  size is normal. There is normal pulmonary artery systolic pressure. The  estimated right ventricular systolic pressure is 30.8 mmHg.   3. The mitral valve is normal in structure. Mild mitral valve  regurgitation. No evidence of mitral stenosis.   4. The aortic valve is normal in structure. Aortic valve regurgitation is  not visualized. No aortic stenosis is present.   5. The inferior vena cava is dilated in size with >50% respiratory  variability, suggesting right atrial pressure of 8 mmHg.   Patient Profile     70 y.o. female w/PMHx  of AT, HTN, CKD (V) IV, AFlutter (ablated), paroxysmal Afib, admitted with weakness and near syncope, found with pauses/dx with tachy-brady.  Had temp wire placed initially 12/25/2020 PPM implanted 12/29/2020 Post procedure CP > echo 12/23/20 with small pericardial effusion no tamponade mentioned incomplete RV emptying CP resolved Repeat echo 12/24/20 with stable small pericardial effusion, no tamponade  Assessment & Plan    Weakness, near syncope Pauses Paroxysmal Afib Tachy brady Temp pacing wire initially PPM 12/26/2020 POD #1 device check with stable measurements POD #1 CXR  without PTx Small stable pericardial effusion without tamponade by 2 follow up echos last week This is not  contributing to her overall clinical picture  HRs are controlled on current  Amiodarone gtt resumed with difficult to take PO yesterday Diltiazem 120mg  Q8 >> though with BIPAP requirements hs been unable to take PO Heparin gtt   In d/w RN who has had her now a few days Since being able to get CRRT going (interrupted yesterday) her overall appearance and status is improved Hopefully with volume removed her respiratory status will improve and we can transition back to PO  I will not start dilt gtt given rates controlled and on pressors   She has had hypotension and worsening renal function since her admission Worsening SOB Remains on low dose pressors Started on CRRT yesterday, nephrology and CCM following   For questions or updates, please contact [facility] Please consult [email or web address] for contact info under      Signed, Baldwin Jamaica, PA-C  12/28/2020, 8:37 AM     EP attending  Patient seen and examined.  Agree with the findings as documented above.  The patient is a pleasant 70 year old woman with a history of symptomatic paroxysmal atrial fibrillation and long pauses with lupus and stage V renal insufficiency.  She underwent insertion of a dual-chamber pacemaker.  She had transient chest pain which was reproducible with palpation.  She has had recurrent atrial fibrillation with a rapid ventricular response.  She has been placed on intravenous amiodarone as well as AV nodal blocking agents.  Because of worsening volume status, the patient has undergone CRRT.  She is just now beginning to lose weight. On exam she has a frail elderly appearing woman looking older than her stated age.  Lungs reveal scattered rales bilaterally.  Cardiovascular exam revealed a irregular irregular rhythm with no rubs.  Extremities were warm.  Telemetry demonstrated atrial fibrillation with a controlled ventricular response.  Assessment and plan 1.  Persistent atrial fibrillation - at this point  I would recommend continuing amiodarone for rate control.  Hopefully she will return to sinus rhythm spontaneously.  Long-term, if she remains in A. fib, after 3 weeks of systemic anticoagulation, DC cardioversion would be a consideration. 2.  Worsening renal failure -hopefully her kidney function will improve.  If not consideration for hemodialysis will be considered.  Cristopher Peru, MD

## 2020-12-28 NOTE — Plan of Care (Signed)

## 2020-12-28 NOTE — Progress Notes (Signed)
ANTICOAGULATION CONSULT NOTE - Follow Up Consult  Pharmacy Consult for Warfarin > heparin Indication: atrial fibrillation  Allergies  Allergen Reactions   Codeine Itching    Patient Measurements: Height: 5\' 3"  (160 cm) Weight: 67.9 kg (149 lb 11.1 oz) IBW/kg (Calculated) : 52.4  Vital Signs: Temp: 97 F (36.1 C) (10/24 1600) Temp Source: Axillary (10/24 1600) BP: 95/65 (10/24 1600)  Labs: Recent Labs    12/26/20 0348 12/26/20 1413 12/27/20 0418 12/28/20 0543 12/28/20 1603  HGB 8.5*  --  8.2* 7.3*  --   HCT 24.2*  --  22.8* 20.0*  --   PLT 290  --  212 186  --   LABPROT 29.7* 55.4* 17.2* 16.0*  --   INR 2.8* 6.3* 1.4* 1.3*  --   HEPARINUNFRC  --   --   --   --  <0.10*  CREATININE 5.90*  --  2.69* 3.49*  3.53* 2.50*     Estimated Creatinine Clearance: 19.6 mL/min (A) (by C-G formula based on SCr of 2.5 mg/dL (H)).   Assessment: 70 year old female to begin warfarin for Afib, s/p PPM this admission.   INR increased to 6.3 after one dose of warfarin - reversed with vitamin K 5 mg. INR now down to 1.3  No s/sx of bleeding. H/h stable. Overnight increased sedation and respiratory rate possible aspiration > now on Bipap and NPO  Holding warfarin start heparin drip for now Stop heparin in CRRT and convert to systemic heparin   Heparin level this evening undetectable on heparin at 800 units/hr.  No overt bleeding or complications noted.  No known issues with IV infusion.  Goal of Therapy:  Heparin level = 0.3-0.7  INR 2-3 Monitor platelets by anticoagulation protocol: Yes   Plan:  Bolus heparin 2000 units x 1. Increase IV heparin to 950 units/hr. Heparin level in 8hr and daily Heparin level and CBC  [place] Crane, Roylene Reason, BCCP Clinical Pharmacist  12/28/2020 5:25 PM   [facility] pharmacy phone numbers are listed on [email or web address]

## 2020-12-28 NOTE — Procedures (Signed)
Central Venous Catheter Insertion Procedure Note  Debra Petersen  [id number]  [birth year]  Date:12/28/20  Time:1:47 AM   Provider Performing:Ayannah Faddis R Arlin Savona   Procedure: Insertion of Non-tunneled Central Venous Catheter(36556)with US guidance (60165)    Indication(s) Hemodialysis  Consent Risks of the procedure as well as the alternatives and risks of each were explained to the patient and/or caregiver.  Consent for the procedure was obtained and is signed in the bedside chart  Anesthesia Topical only with 1% lidocaine   Timeout Verified patient identification, verified procedure, site/side was marked, verified correct patient position, special equipment/implants available, medications/allergies/relevant history reviewed, required imaging and test results available.  Sterile Technique Maximal sterile technique including full sterile barrier drape, hand hygiene, sterile gown, sterile gloves, mask, hair covering, sterile ultrasound probe cover (if used).  Procedure Description Area of catheter insertion was cleaned with chlorhexidine and draped in sterile fashion.   With real-time ultrasound guidance a HD catheter was placed into the right femoral vein.  Nonpulsatile blood flow and easy flushing noted in all ports.  The catheter was sutured in place and sterile dressing applied.  Complications/Tolerance None; patient tolerated the procedure well. Chest X-ray is ordered to verify placement for internal jugular or subclavian cannulation.  Chest x-ray is not ordered for femoral cannulation.  EBL Minimal  Specimen(s) None        Debra Carpen Corin Tilly, PA-C

## 2020-12-28 NOTE — Progress Notes (Signed)
ANTICOAGULATION CONSULT NOTE - Follow Up Consult  Pharmacy Consult for Warfarin > heparin Indication: atrial fibrillation  Allergies  Allergen Reactions   Codeine Itching    Patient Measurements: Height: 5\' 3"  (160 cm) Weight: 67.9 kg (149 lb 11.1 oz) IBW/kg (Calculated) : 52.4  Vital Signs: Temp: 97.4 F (36.3 C) (10/24 0800) Temp Source: Axillary (10/24 0800) BP: 105/72 (10/24 0800) Pulse Rate: 88 (10/24 0035)  Labs: Recent Labs    12/26/20 0348 12/26/20 1413 12/27/20 0418 12/28/20 0543  HGB 8.5*  --  8.2* 7.3*  HCT 24.2*  --  22.8* 20.0*  PLT 290  --  212 186  LABPROT 29.7* 55.4* 17.2* 16.0*  INR 2.8* 6.3* 1.4* 1.3*  CREATININE 5.90*  --  2.69* 3.49*  3.53*     Estimated Creatinine Clearance: 14.1 mL/min (A) (by C-G formula based on SCr of 3.49 mg/dL (H)).   Assessment: 70 year old female to begin warfarin for Afib, s/p PPM this admission.   INR increased to 6.3 after one dose of warfarin - reversed with vitamin K 5 mg. INR now down to 1.3  No s/sx of bleeding. H/h stable. Overnight increased sedation and respiratory rate possible aspiration > now on Bipap and NPO  Holding warfarin start heparin drip for now Stop heparin in CRRT and convert to systemic heparin   Goal of Therapy:  Heparin level = 0.3-0.7  INR 2-3 Monitor platelets by anticoagulation protocol: Yes   Plan:  Stop warfarin for now Removed heparin syringe from CRRT circuit  Begin heparin drip 800 uts/hr  Heparin level in 6hr and daily Heparin level and CBC    Bonnita Nasuti Pharm.D. CPP, BCPS Clinical Pharmacist [phone] 12/28/2020 10:18 AM  Please check AMION for all [place] phone numbers

## 2020-12-28 NOTE — Progress Notes (Signed)
SLP Cancellation Note  Patient Details Name: Debra Petersen MRN: [id number] DOB: February 09, 1951   Cancelled treatment:       Reason Eval/Treat Not Completed: Other (comment). Discussed with RN. Pt on BiPAP and CRRT, will f/u tomorrow for readiness for further assessment. Likely that respiratory instability impacting swallowing ability.    Linzy Darling, Katherene Ponto 12/28/2020, 8:45 AM

## 2020-12-28 NOTE — Progress Notes (Signed)
Cross covering ICU physician.   Called 2/2 vas cath still not functioning. Repeat cxr revealed that the line has migrated out quite a bit. Will need replacement. If able, will do so this evening. Otherwise pt should be ok to have replaced in am with more staff available.   No emergent need for dialysis and doing ok on NIV from resp standpoint despite increased airspace disease on cxr.

## 2020-12-29 DIAGNOSIS — I4891 Unspecified atrial fibrillation: Secondary | ICD-10-CM | POA: Diagnosis not present

## 2020-12-29 LAB — CBC
HCT: 19.6 % — ABNORMAL LOW (ref 36.0–46.0)
Hemoglobin: 7.1 g/dL — ABNORMAL LOW (ref 12.0–15.0)
MCH: 34.6 pg — ABNORMAL HIGH (ref 26.0–34.0)
MCHC: 36.2 g/dL — ABNORMAL HIGH (ref 30.0–36.0)
MCV: 95.6 fL (ref 80.0–100.0)
Platelets: 103 10*3/uL — ABNORMAL LOW (ref 150–400)
RBC: 2.05 MIL/uL — ABNORMAL LOW (ref 3.87–5.11)
RDW: 15.9 % — ABNORMAL HIGH (ref 11.5–15.5)
WBC: 21.1 10*3/uL — ABNORMAL HIGH (ref 4.0–10.5)
nRBC: 3.5 % — ABNORMAL HIGH (ref 0.0–0.2)

## 2020-12-29 LAB — RENAL FUNCTION PANEL
Albumin: 1.6 g/dL — ABNORMAL LOW (ref 3.5–5.0)
Albumin: 1.8 g/dL — ABNORMAL LOW (ref 3.5–5.0)
Anion gap: 7 (ref 5–15)
Anion gap: 9 (ref 5–15)
BUN: 21 mg/dL (ref 8–23)
BUN: 19 mg/dL (ref 8–23)
CO2: 25 mmol/L (ref 22–32)
CO2: 24 mmol/L (ref 22–32)
Calcium: 8.6 mg/dL — ABNORMAL LOW (ref 8.9–10.3)
Calcium: 9 mg/dL (ref 8.9–10.3)
Chloride: 101 mmol/L (ref 98–111)
Chloride: 101 mmol/L (ref 98–111)
Creatinine, Ser: 2.05 mg/dL — ABNORMAL HIGH (ref 0.44–1.00)
Creatinine, Ser: 1.69 mg/dL — ABNORMAL HIGH (ref 0.44–1.00)
GFR, Estimated: 26 mL/min — ABNORMAL LOW (ref >60)
GFR, Estimated: 32 mL/min — ABNORMAL LOW (ref >60)
Glucose, Bld: 120 mg/dL — ABNORMAL HIGH (ref 70–99)
Glucose, Bld: 109 mg/dL — ABNORMAL HIGH (ref 70–99)
Phosphorus: 2.9 mg/dL (ref 2.5–4.6)
Phosphorus: 3.1 mg/dL (ref 2.5–4.6)
Potassium: 4.3 mmol/L (ref 3.5–5.1)
Potassium: 4.5 mmol/L (ref 3.5–5.1)
Sodium: 133 mmol/L — ABNORMAL LOW (ref 135–145)
Sodium: 134 mmol/L — ABNORMAL LOW (ref 135–145)

## 2020-12-29 LAB — MAGNESIUM: Magnesium: 2.5 mg/dL — ABNORMAL HIGH (ref 1.7–2.4)

## 2020-12-29 LAB — PROTIME-INR
INR: 1.1 (ref 0.8–1.2)
Prothrombin Time: 14.5 seconds (ref 11.4–15.2)

## 2020-12-29 LAB — GLUCOSE, CAPILLARY
Glucose-Capillary: 100 mg/dL — ABNORMAL HIGH (ref 70–99)
Glucose-Capillary: 107 mg/dL — ABNORMAL HIGH (ref 70–99)
Glucose-Capillary: 108 mg/dL — ABNORMAL HIGH (ref 70–99)
Glucose-Capillary: 98 mg/dL (ref 70–99)
Glucose-Capillary: 99 mg/dL (ref 70–99)

## 2020-12-29 LAB — HEPARIN LEVEL (UNFRACTIONATED)
Heparin Unfractionated: >1.1 IU/mL — ABNORMAL HIGH (ref 0.30–0.70)
Heparin Unfractionated: 0.25 IU/mL — ABNORMAL LOW (ref 0.30–0.70)
Heparin Unfractionated: 0.28 IU/mL — ABNORMAL LOW (ref 0.30–0.70)
Heparin Unfractionated: <0.1 IU/mL — ABNORMAL LOW (ref 0.30–0.70)
Heparin Unfractionated: <0.1 IU/mL — ABNORMAL LOW (ref 0.30–0.70)

## 2020-12-29 LAB — PREPARE RBC (CROSSMATCH)

## 2020-12-29 LAB — PATHOLOGIST SMEAR REVIEW

## 2020-12-29 LAB — ABO/RH: ABO/RH(D): O POS

## 2020-12-29 MED ORDER — SODIUM CHLORIDE 0.9% IV SOLUTION: Freq: Once | INTRAVENOUS | Status: AC

## 2020-12-29 NOTE — Progress Notes (Signed)
PT Cancellation Note  Patient Details Name: Debra Petersen MRN: [id number] DOB: 1950-11-06   Cancelled Treatment:    Reason Eval/Treat Not Completed: Medical issues which prohibited therapy; trying to wean from  BiPAP, still on pressors, new HD access R fem, low hgb (getting unit of blood later today). OT spoke with RN and therapy team will hold and re-attempt PT/OT treatment tomorrow.   Mabeline Caras, PT, DPT Acute Rehabilitation Services  Pager [phone] Office [place] 12/29/2020, 11:07 AM

## 2020-12-29 NOTE — Progress Notes (Addendum)
Progress Note  Patient Name: Bijal Siglin Date of Encounter: 12/29/2020  [facility] Cardiologist: Dr. Caryl Comes  Subjective   + SOB, on BIPAP  Inpatient Medications    Scheduled Meds:  sodium chloride   Intravenous Once   chlorhexidine  15 mL Mouth Rinse BID   Chlorhexidine Gluconate Cloth  6 each Topical Daily   [START ON 01/03/2021] darbepoetin (ARANESP) injection - NON-DIALYSIS  200 mcg Subcutaneous Q Sun-1800   insulin aspart  1-3 Units Subcutaneous Q4H   mouth rinse  15 mL Mouth Rinse q12n4p   sodium chloride flush  10-40 mL Intracatheter Q12H   Continuous Infusions:   prismasol BGK 4/2.5 400 mL/hr at 12/29/20 0515    prismasol BGK 4/2.5 200 mL/hr at 12/28/20 0410   sodium chloride     sodium chloride     sodium chloride     amiodarone 30 mg/hr (12/29/20 0600)   ceFEPime (MAXIPIME) IV Stopped (12/29/20 0308)   heparin 1,050 Units/hr (12/29/20 0600)   norepinephrine (LEVOPHED) Adult infusion 2 mcg/min (12/29/20 0600)   prismasol BGK 4/2.5 1,000 mL/hr at 12/29/20 0400   vancomycin Stopped (12/28/20 1231)   vasopressin 0.03 Units/min (12/29/20 0634)   PRN Meds: sodium chloride, sodium chloride, food thickener, heparin, HYDROmorphone (DILAUDID) injection, melatonin, [DISCONTINUED] ondansetron **OR** ondansetron (ZOFRAN) IV, oxyCODONE-acetaminophen, sodium chloride   Vital Signs    Vitals:   12/29/20 0408 12/29/20 0409 12/29/20 0500 12/29/20 0600  BP:   (!) 95/57 (!) 87/51  Pulse: (!) 106     Resp: 15  20 15   Temp:      TempSrc:      SpO2: 98% 98%  93%  Weight:   58.4 kg   Height:        Intake/Output Summary (Last 24 hours) at 12/29/2020 0841 Last data filed at 12/29/2020 0600 Gross per 24 hour  Intake 1057.51 ml  Output 3171 ml  Net -2113.49 ml   Last 3 Weights 12/29/2020 12/28/2020 12/27/2020  Weight (lbs) 128 lb 12 oz 149 lb 11.1 oz 128 lb 8.5 oz  Weight (kg) 58.4 kg 67.9 kg 58.3 kg      Telemetry    AFib, rates 80's-100's, infrequent V paced  beat - Personally Reviewed  ECG    No new EKGs - Personally Reviewed  Physical Exam   Examined by Dr.Ladonya Jerkins, largely unchanged GEN: looks older then her age, chronically ill in appearance, comfortable on BIPAP Neck: No JVD Cardiac: irreg-irreg, no murmurs, rubs, or gallops, warm extremeties Respiratory: diminished at the bases, scattered rales GI: Soft, nontender, non-distended  MS: No edema; No deformity, advanced atrophy for age Neuro:  Nonfocal , more alert today Psych: Normal affect  Pacer site: dressing is clean and dry, no hematoma  Labs    High Sensitivity Troponin:   Recent Labs  Lab 12/23/2020 1626 12/24/2020 1940 12/22/2020 0439 12/07/2020 0812  TROPONINIHS 1,843* 1,658* 1,457* 1,294*     Chemistry Recent Labs  Lab 12/23/20 0132 12/24/20 0354 12/24/20 1737 12/27/20 0418 12/28/20 0543 12/28/20 1603 12/29/20 0310  NA 131* 125*   < > 134* 134*  133* 135 133*  K 4.6 4.5   < > 3.3* 4.5  4.5 4.4 4.3  CL 94* 87*   < > 96* 99  99 100 101  CO2 24 22   < > 25 24  23 25 25   GLUCOSE 100* 283*   < > 94 122*  123* 116* 120*  BUN 34* 40*   < > 20 33*  34* 25* 21  CREATININE 5.91* 6.60*   < > 2.69* 3.49*  3.53* 2.50* 2.05*  CALCIUM 9.0 8.4*   < > 8.7* 8.8*  8.8* 9.0 8.6*  MG 1.7 2.3   < > 2.2 2.5*  --  2.5*  PROT 6.1* 5.4*  --   --   --   --   --   ALBUMIN 2.5* 2.1*  --   --  1.6* 1.7* 1.6*  AST 35 15  --   --   --   --   --   ALT 6 5  --   --   --   --   --   ALKPHOS 224* 165*  --   --   --   --   --   BILITOT 0.3 0.3  --   --   --   --   --   GFRNONAA 7* 6*   < > 19* 14*  13* 20* 26*  ANIONGAP 13 16*   < > 13 11  11 10 7    < > = values in this interval not displayed.    Lipids No results for input(s): CHOL, TRIG, HDL, LABVLDL, LDLCALC, CHOLHDL in the last 168 hours.  Hematology Recent Labs  Lab 12/27/20 0418 12/28/20 0543 12/29/20 0310  WBC 19.0* 17.5* 21.1*  RBC 2.35* 2.10* 2.05*  HGB 8.2* 7.3* 7.1*  HCT 22.8* 20.0* 19.6*  MCV 97.0 95.2 95.6  MCH  34.9* 34.8* 34.6*  MCHC 36.0 36.5* 36.2*  RDW 14.9 15.5 15.9*  PLT 212 186 103*   Thyroid No results for input(s): TSH, FREET4 in the last 168 hours.  BNP Recent Labs  Lab 12/23/20 0132 12/24/20 0354 12/27/20 0418  BNP 1,454.2* 1,160.9* 1,561.2*    DDimer No results for input(s): DDIMER in the last 168 hours.   Radiology    DG CHEST PORT 1 VIEW  Result Date: 12/27/2020 CLINICAL DATA:  Central line placement EXAM: PORTABLE CHEST 1 VIEW COMPARISON:  12/27/2020 FINDINGS: Right internal jugular Vas-Cath placed with the tip in the right innominate vein. No pneumothorax. Bilateral airspace disease, stable or slightly worsened since prior study. Small bilateral pleural effusions. Heart is normal size. Left pacer remains in place, unchanged. IMPRESSION: Right internal jugular Vas-Cath tip in the right innominate vein. No pneumothorax. Patchy bilateral airspace disease, stable or slightly worsened since prior study. Small bilateral layering effusions. Electronically Signed   By: Rolm Baptise M.D.   On: 12/27/2020 23:59    Cardiac Studies    12/24/20: TTE (limited) IMPRESSIONS   1. Left ventricular ejection fraction, by estimation, is 65 to 70%. The  left ventricle has hyperdynamic function. The left ventricle has no  regional wall motion abnormalities.   2. There is a small circumferential pericardial effusion. 20% mitral E  inflow velocity respirophasic variation (likely not significant). IVC is  dilated but collapses normally with inspiration. There is no RV diastolic  collapse. This study is not  suggestive of tamponade.   3. The aortic valve is tricuspid.   4. The inferior vena cava is dilated in size with >50% respiratory  variability, suggesting right atrial pressure of 8 mmHg.   5. Limited echo with no color doppler.   6. The patient was in atrial fibrillation.    12/23/20: TTE (limited) IMPRESSIONS   1. Left ventricular ejection fraction, by estimation, is 60 to 65%. The   left ventricle has normal function. The left ventricle has no regional  wall motion abnormalities.  2. Right ventricular systolic function is hyperdynamic. The right  ventricular size is normal.   3. There is a small circumferential pericardial effusion is present.  There is lack of full expansion of the right ventricle; this could be an  early sign of increased pericardial pressure. Hyperdynamic septum with no  evidence of ventricular  interdependence.   4. The mitral valve is normal in structure. No evidence of mitral valve  regurgitation. No evidence of mitral stenosis.   5. The aortic valve is normal in structure. Aortic valve regurgitation is  not visualized. No aortic stenosis is present.   6. The inferior vena cava is normal in size with greater than 50%  respiratory variability, suggesting right atrial pressure of 3 mmHg.  Conclusion(s)/Recommendation(s): Due to findings of early increased  pericardial pressure, a follow up echocardiogram is recommended in  tomorrow.    12/14/2020: TTE IMPRESSIONS   1. Left ventricular ejection fraction, by estimation, is 65 to 70%. The  left ventricle has normal function. The left ventricle has no regional  wall motion abnormalities. Left ventricular diastolic parameters are  consistent with Grade I diastolic  dysfunction (impaired relaxation).   2. Right ventricular systolic function is normal. The right ventricular  size is normal. There is normal pulmonary artery systolic pressure. The  estimated right ventricular systolic pressure is 19.1 mmHg.   3. The mitral valve is normal in structure. Mild mitral valve  regurgitation. No evidence of mitral stenosis.   4. The aortic valve is normal in structure. Aortic valve regurgitation is  not visualized. No aortic stenosis is present.   5. The inferior vena cava is dilated in size with >50% respiratory  variability, suggesting right atrial pressure of 8 mmHg.   Patient Profile     70 y.o.  female w/PMHx  of AT, HTN, CKD (V) IV, AFlutter (ablated), paroxysmal Afib, admitted with weakness and near syncope, found with pauses/dx with tachy-brady.  Had temp wire placed initially 12/31/2020 PPM implanted 01/04/2021 Post procedure CP > echo 12/23/20 with small pericardial effusion no tamponade mentioned incomplete RV emptying CP resolved Repeat echo 12/24/20 with stable small pericardial effusion, no tamponade  Assessment & Plan    Weakness, near syncope Pauses Paroxysmal Afib Tachy brady Temp pacing wire initially PPM 12/23/2020, POD #8  POD #1 device check with stable measurements POD #1 CXR without PTx Small stable pericardial effusion without tamponade by 2 follow up echos last week This is not contributing to her overall clinical picture  HRs are controlled on current  Amiodarone gtt resumed with difficult to take PO/BIPAP tx Heparin gtt   She has had hypotension and worsening renal function since her admission Worsening SOB > still on BIPAP More alert today Remains on low dose pressors Started on CRRT with volume being removed  Nutrition as per CCM  Continue care with nephrology and CCM   Dr. Lovena Le has seen and examinedthe patient this morning No new recommendations today > to PO amiodarone once off pressors and clinic status allows   For questions or updates, please contact [facility] Please consult [email or web address] for contact info under      Signed, Baldwin Jamaica, PA-C  12/29/2020, 8:41 AM     EP Attending  Patient seen and examined. Agree with above. She looks a little better today. More alert and bp is stable on pressors and IV amiodarone. Tolerating bipap. CRRT is currently working well and the fluid is being removed. Once she is off of pressors we will  switch to oral amiodarone.  Carleene Overlie Celines Femia,MD

## 2020-12-29 NOTE — Progress Notes (Signed)
OT Cancellation Note  Patient Details Name: Debra Petersen MRN: [id number] DOB: 1950-09-06   Cancelled Treatment:    Reason Eval/Treat Not Completed: Patient not medically ready. Trying to wean from  BiPAP, still on pressors, new HD access R fem, low Hb (getting unit of blood later today). Spoke with RN and therapy team will hold and re-attempt eval tomorrow.   Merri Ray Gary Gabrielsen 12/29/2020, 10:03 AM  Jesse Sans OTR/L Acute Rehabilitation Services Pager: [phone] Office: [phone]

## 2020-12-29 NOTE — Progress Notes (Addendum)
Initial Nutrition Assessment  DOCUMENTATION CODES:  Severe malnutrition in context of chronic illness  INTERVENTION:  Recommend Cortrak placement.  Initiate tube feeding via Cortrak: Vital 1.5 at 20 ml/h and increase by 10 ml/h every 8 hrs to goal rate of 50 ml/h (1200 ml per day) Prosource TF 90 ml TID  Provides 2040 kcal, 147 gm protein, 912 ml free water daily   Free water flushes per CCM.  Monitor magnesium, potassium, and phosphorus BID for at least 3 days, MD to replete as needed, as pt is at risk for refeeding syndrome.  Consider starting a bowel regimen given no BM in 10 days.  NUTRITION DIAGNOSIS:  Severe Malnutrition related to chronic illness (CKD V) as evidenced by severe fat depletion, severe muscle depletion.  GOAL:  Patient will meet greater than or equal to 90% of their needs  MONITOR:  Diet advancement, TF tolerance, Labs, I & O's, Weight trends  REASON FOR ASSESSMENT:  Rounds    ASSESSMENT:  70 yo female with a PMH of who presented to [facility] 10/15 for weakness and increasing dizziness x 2 days and palpitations. 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 Admitted with acute respiratory failure with hypoxia requiring noninvasive ventilation due to volume overload and bacterial pneumonia. 10/19 - PCCM consulted in the setting of hypotension with possible sepsis, low-dose pressor requirement. ECHO with new pericardial effusion 10/20 - re consulted for recurrent hypotension req pressors. Needs HD cath placed. 10/21 chest pain feeling better. Got first HD run this morning 10/22 - on Nrb. More confused. On low dose NE.  10/23 - on NE, vaso and BiPAP after 1L volume removal iHD overnight; CRRT started 10/24 - dialysis catheter repositioned.  Patient did not receive adequate  ultrafiltration overnight 10/25 -continues to have marginal saturations off BiPAP  Spoke with pt at beside. She's a little confused this morning. Son reports that she would probably do well with some cold ice cream.  No PO documented since 10/21. Pt eating very little prior - 10-100% over last 8 meals (average of 32%).  Currently trying to wean off BiPAP. Per MD note, "Very slow to improve, continues to have significant hypoxia and hypotension despite fluid removal and adequate antibiotic coverage. Significant signs of frailty, may not have physiologic reserve to recover."  CRRT overnight - now 2 L net negative. No UOP per Nephrology.   Intake/Output Summary (Last 24 hours) at 12/29/2020 1447 Last data filed at 12/29/2020 1300 Gross per 24 hour  Intake 1064.95 ml  Output 3048 ml  Net -1983.05 ml   Admit wt: 56.7 kg Current wt: 58.4 kg  MAP: >58  Spoke with RN regarding no bowel movement in 10 days - RN to reach out to MD for a possible suppository.  Medications: reviewed; SSI, Levophed, vasopressin  Labs: reviewed; Na 133 (L), CBG 93-107 HbA1c: 5% (12/24/2020)  NUTRITION - FOCUSED PHYSICAL EXAM: Flowsheet Row Most Recent Value  Orbital Region Severe depletion  Upper Arm Region Moderate depletion  Thoracic and Lumbar Region Unable to assess  Buccal Region Severe depletion  Temple Region Severe depletion  Clavicle Bone Region Moderate depletion  Clavicle and Acromion Bone Region Moderate depletion  Scapular Bone Region Moderate depletion  Dorsal Hand Severe depletion  Patellar Region Moderate depletion  Anterior Thigh Region Moderate depletion  Posterior Calf Region Unable to assess  Edema (  RD Assessment) None  Hair Reviewed  Eyes Reviewed  Mouth Reviewed  Skin Reviewed  Nails Reviewed   Diet Order:   Diet Order             Diet NPO time specified  Diet effective now                  EDUCATION NEEDS:  Education needs have been addressed  Skin:  Skin  Assessment: Skin Integrity Issues: Skin Integrity Issues:: Incisions Incisions: L breast, closed and R throat, closed  Last BM:  01/03/2021  Height: Ht Readings from Last 1 Encounters:  12/23/2020 5\' 3"  (1.6 m)   Weight:  Wt Readings from Last 1 Encounters:  12/29/20 58.4 kg   BMI:  Body mass index is 22.81 kg/m.  Estimated Nutritional Needs:  Kcal:  1700-1900 Protein:  125-145 grams Fluid:  >1.7 L  Derrel Nip, RD, LDN (she/her/hers) Registered Dietitian I Pager #: [phone] After-Hours/Weekend Pager # in [place]

## 2020-12-29 NOTE — Progress Notes (Signed)
NAME:  Debra Petersen, MRN:  [id number], DOB:  03-10-1950, LOS: 72 ADMISSION DATE:  12/28/2020 CONSULTATION DATE:  12/23/2020 REFERRING MD:  Tyrell Antonio - [facility] CHIEF COMPLAINT: Hypotension, possible sepsis   History of Present Illness:  70 year old woman who presented to [facility] 10/15 for weakness and increasing dizziness x 2 days and palpitations. PMHx significant for HTN, HLD, Aflutter (s/p ablation 2010) with paroxysmal Afib (on Rythmol), SLE c/b lupus nephritis (many years ago), CKD stage V (not dialysis-dependent, undergoing transplant eval at [facility]) and hypothyroidism.  In ED, patient was tachycardic to 130s with telemetry demonstrating Afib with RVR. Cardizem gtt was started at slow rate, given patient report of mild bradycardia at home rates 40s-60s). Heparin gtt was initiated for [facility]. Troponin was elevated to 1843, presumed secondary to demand ischemia. Patient was admitted to [facility] with Cardiology/Nephrology consults.   After 4-5 second pause noted on tele 10/15 with variable HR (50s to 130s), concern raised for sick sinus syndrome and EP was consulted, given complex management of antiarrhythmics in the setting of CKD. Underwent temp pacer placement 10/16 with subsequent permanent pacemaker placement 10/17.  On 10/18PM, patient had brief period of hypotension prompting peripheral low-dose pressor initiation. Additionally, Cr worsened with concern for ARF superimposed on CKD. PCCM was consulted for hypotension/ARF with concern for sepsis.  Pertinent Medical History:   Past Medical History:  Diagnosis Date   CKD (chronic kidney disease)    Lupus (systemic lupus erythematosus) (HCC)    Wide-complex tachycardia    probable atrial flutter with 1-1 and 2-1 conduction   Significant Hospital Events: Including procedures, antibiotic start and stop dates in addition to other pertinent events   10/19 - PCCM consulted in the setting of hypotension with possible sepsis, low-dose pressor requirement. ECHO with  new pericardial effusion 10/20 - re consulted for recurrent hypotension req pressors. C/o chest pain. Needs HD cath placed. Broadening abx   10/21 chest pain feeling better. Got first HD run this morning 10/22 on Nrb. More confused. On low dose NE.  10/23 on NE, vaso and BiPAP after 1L volume removal iHD overnight  10/24 dialysis catheter repositioned.  Patient did not receive adequate ultrafiltration overnight 10/25 continues to have marginal saturations off BiPAP  Interim History / Subjective:  Episodes of confusion overnight.   Objective:  Blood pressure (!) 87/51, pulse (!) 106, temperature (!) 97.5 F (36.4 C), temperature source Axillary, resp. rate 15, height 5\' 3"  (1.6 m), weight 58.4 kg, SpO2 93 %.    Vent Mode: BIPAP;PCV FiO2 (%):  [45 %-70 %] 45 % Set Rate:  [15 bmp] 15 bmp PEEP:  [8 cmH20] 8 cmH20   Intake/Output Summary (Last 24 hours) at 12/29/2020 0809 Last data filed at 12/29/2020 0600 Gross per 24 hour  Intake 1057.51 ml  Output 3171 ml  Net -2113.49 ml    Filed Weights   12/27/20 0551 12/28/20 0500 12/29/20 0500  Weight: 58.3 kg 67.9 kg 58.4 kg   Physical Examination: General: Frail.  In no distress Neuro: confused, continually asks for repetition. Generalized weakness.  HEENT: BiPAP with good seal.  Sclera are pale. CV: irir s1s1 no rgm  PULM: Fine crackles at right base have resolved. coarse crackles at the left base. GI: soft ndnt + bowel sounds  Extremities: Bilateral edema predominantly in dependent zones. Markedly decreased muscle mass.  Skin: pale c/d/w. Scattered bruising    Resolved Hospital Problem List:    Assessment & Plan:   Critically ill due to acute respiratory failure with  hypoxia requiring noninvasive ventilation due to volume overload and bacterial pneumonia Critically ill due to distributive shock presumably secondary to bacterial pneumonia Critically ill due to atrial fibrillation with rapid ventricular response requiring  titration of amiodarone Critically ill due to acute kidney injury superimposed on stage V kidney disease requiring CRRT Anticoagulation related coagulopathy Pericardial effusion without tamponade Dysphagia Cachexia with severe protein calorie malnutrition.    Very slow to improve, continues to have significant hypoxia and hypotension despite fluid removal and adequate antibiotic coverage.  Significant signs of frailty, may not have physiologic reserve to recover.   Plan:  -Continue CRRT goal fluid removal 100 net fluid removal -Wean vasopressors starting with vasopressin. -Serial echo shows only small pericardial effusion and normal LV function. -Trial off BiPAP as tolerated -Continue IV amiodarone for now until cleared for oral intake. -Start IV heparin systemically for stroke prevention. -Ongoing speech therapy -Cortrak for enteral nutrition.   Best Practice: (right click and "Reselect all SmartList Selections" daily)   Diet-nectar thick  / NPO on BiPAP Code Status: Full  VTE ppx: SCD GI ppx: n/a  Goals of care: 10/24 pt and son at bedside  Lines- temp HD cath RIJ   Labs:  CBC: Recent Labs  Lab 12/23/20 0132 12/24/20 0354 12/24/20 1737 12/25/20 0349 12/26/20 0348 12/27/20 0418 12/28/20 0543 12/29/20 0310  WBC 21.9* 15.4*  --  17.2* 19.0* 19.0* 17.5* 21.1*  NEUTROABS 15.5* 12.2*  --   --   --   --   --   --   HGB 9.9* 8.8*   < > 8.8* 8.5* 8.2* 7.3* 7.1*  HCT 29.9* 26.7*   < > 25.9* 24.2* 22.8* 20.0* 19.6*  MCV 107.9* 105.5*  --  102.8* 99.2 97.0 95.2 95.6  PLT 273 258  --  283 290 212 186 103*   < > = values in this interval not displayed.    Basic Metabolic Panel: Recent Labs  Lab 12/24/20 0354 12/24/20 1737 12/26/20 0348 12/27/20 0418 12/28/20 0543 12/28/20 1603 12/29/20 0310  NA 125*   < > 131* 134* 134*  133* 135 133*  K 4.5   < > 4.7 3.3* 4.5  4.5 4.4 4.3  CL 87*   < > 96* 96* 99  99 100 101  CO2 22   < > 23 25 24  23 25 25   GLUCOSE 283*   <  > 99 94 122*  123* 116* 120*  BUN 40*   < > 45* 20 33*  34* 25* 21  CREATININE 6.60*   < > 5.90* 2.69* 3.49*  3.53* 2.50* 2.05*  CALCIUM 8.4*   < > 9.3 8.7* 8.8*  8.8* 9.0 8.6*  MG 2.3  --  3.2* 2.2 2.5*  --  2.5*  PHOS  --   --  7.9* 3.6 4.4  4.6 3.3 2.9   < > = values in this interval not displayed.    GFR: Estimated Creatinine Clearance: 21.4 mL/min (A) (by C-G formula based on SCr of 2.05 mg/dL (H)). Recent Labs  Lab 12/23/20 1340 12/23/20 1450 12/24/20 0354 12/25/20 0349 12/26/20 0348 12/27/20 0418 12/28/20 0543 12/29/20 0310  PROCALCITON  --  4.91 6.15 17.19  --   --   --   --   WBC  --   --  15.4* 17.2* 19.0* 19.0* 17.5* 21.1*  LATICACIDVEN 2.0* 1.1  --   --   --   --   --   --     Liver  Function Tests: Recent Labs  Lab 12/23/20 0132 12/24/20 0354 12/28/20 0543 12/28/20 1603 12/29/20 0310  AST 35 15  --   --   --   ALT 6 5  --   --   --   ALKPHOS 224* 165*  --   --   --   BILITOT 0.3 0.3  --   --   --   PROT 6.1* 5.4*  --   --   --   ALBUMIN 2.5* 2.1* 1.6* 1.7* 1.6*    No results for input(s): LIPASE, AMYLASE in the last 168 hours. No results for input(s): AMMONIA in the last 168 hours.  ABG:    Component Value Date/Time   PHART 7.492 (H) 12/24/2020 1737   PCO2ART 27.7 (L) 12/24/2020 1737   PO2ART 124 (H) 12/24/2020 1737   HCO3 21.2 12/24/2020 1737   TCO2 22 12/24/2020 1737   ACIDBASEDEF 2.0 12/24/2020 1737   O2SAT 99.0 12/24/2020 1737     Coagulation Profile: Recent Labs  Lab 12/26/20 0348 12/26/20 1413 12/27/20 0418 12/28/20 0543 12/29/20 0310  INR 2.8* 6.3* 1.4* 1.3* 1.1     Cardiac Enzymes: No results for input(s): CKTOTAL, CKMB, CKMBINDEX, TROPONINI in the last 168 hours.  HbA1C: Hgb A1c MFr Bld  Date/Time Value Ref Range Status  12/24/2020 04:46 PM 5.0 4.8 - 5.6 % Final    Comment:    (NOTE) Pre diabetes:          5.7%-6.4%  Diabetes:              >6.4%  Glycemic control for   <7.0% adults with diabetes    12/25/2020 01:30 AM 4.9 4.8 - 5.6 % Final    Comment:    (NOTE) Pre diabetes:          5.7%-6.4%  Diabetes:              >6.4%  Glycemic control for   <7.0% adults with diabetes    CBG: Recent Labs  Lab 12/28/20 0410 12/28/20 0813 12/28/20 1136 12/28/20 2013 12/29/20 0003  GLUCAP 104* 110* 104* 93 107*     CRITICAL CARE Performed by: Kipp Brood   Total critical care time: 40 minutes  Critical care time was exclusive of separately billable procedures and treating other patients. Critical care was necessary to treat or prevent imminent or life-threatening deterioration.  Critical care was time spent personally by me on the following activities: development of treatment plan with patient and/or surrogate as well as nursing, discussions with consultants, evaluation of patient's response to treatment, examination of patient, obtaining history from patient or surrogate, ordering and performing treatments and interventions, ordering and review of laboratory studies, ordering and review of radiographic studies, pulse oximetry and re-evaluation of patient's condition.  Kipp Brood, MD [facility] ICU Physician [place]  Pager: [phone] Or Epic Secure Chat After hours: [phone].  12/29/2020, 8:09 AM     12/29/2020, 8:09 AM

## 2020-12-29 NOTE — Progress Notes (Signed)
SLP Cancellation Note  Patient Details Name: Debra Petersen MRN: [id number] DOB: September 10, 1950   Cancelled treatment:       Reason Eval/Treat Not Completed: Patient not medically ready. Following for readiness. Per chart pt still on BiPAP, CRRT and note mentions Cortrak.    Cherye Gaertner, Katherene Ponto 12/29/2020, 9:42 AM

## 2020-12-29 NOTE — Progress Notes (Signed)
ANTICOAGULATION CONSULT NOTE - Follow Up Consult  Pharmacy Consult for Warfarin > heparin Indication: atrial fibrillation  Allergies  Allergen Reactions   Codeine Itching    Patient Measurements: Height: 5\' 3"  (160 cm) Weight: 58.4 kg (128 lb 12 oz) IBW/kg (Calculated) : 52.4  Vital Signs: Temp: 96.3 F (35.7 C) (10/25 1605) Temp Source: Axillary (10/25 1545) BP: 102/49 (10/25 1900) Pulse Rate: 97 (10/25 1215)  Labs: Recent Labs    12/27/20 0418 12/28/20 0543 12/28/20 1603 12/29/20 0159 12/29/20 0310 12/29/20 1152 12/29/20 1607 12/29/20 1748  HGB 8.2* 7.3*  --   --  7.1*  --   --   --   HCT 22.8* 20.0*  --   --  19.6*  --   --   --   PLT 212 186  --   --  103*  --   --   --   LABPROT 17.2* 16.0*  --   --  14.5  --   --   --   INR 1.4* 1.3*  --   --  1.1  --   --   --   HEPARINUNFRC  --   --  <0.10*   < > 0.25* 0.28*  --  <0.10*  CREATININE 2.69* 3.49*  3.53* 2.50*  --  2.05*  --  1.69*  --    < > = values in this interval not displayed.     Estimated Creatinine Clearance: 26 mL/min (A) (by C-G formula based on SCr of 1.69 mg/dL (H)).  Assessment: 70 year old female to begin warfarin for Afib, s/p PPM this admission.   INR increased to 6.3 after one dose of warfarin - reversed with vitamin K 5 mg. INR now down to 1.1  No s/sx of bleeding. H/h stable. Patient currently NPO and unable to take PO meds so now resuming heparin.   Patient also on CRRT. Heprin has been removed from CRRT and converted to systemic heparin.   Heparin level undetectable on 1150 units/hr Heparin drip held x3 hours for blood transfusion Heparin drip restarted ~1630 2130 Heparin level still undetectable  Goal of Therapy:  Heparin level = 0.3-0.7  INR 2-3 Monitor platelets by anticoagulation protocol: Yes   Plan:  Increase heparin to 1250 units/hr Recheck Heparin level @2130  Check HL and CBC daily while on heparin  Thank you for allowing pharmacy to be a part of this patient's  care.  Donnald Garre, PharmD Clinical Pharmacist  Please check AMION for all [facility] numbers After 10:00 PM, call [place] [phone]

## 2020-12-29 NOTE — Progress Notes (Signed)
ANTICOAGULATION CONSULT NOTE - Follow Up Consult  Pharmacy Consult for Warfarin > heparin Indication: atrial fibrillation  Allergies  Allergen Reactions   Codeine Itching    Patient Measurements: Height: 5\' 3"  (160 cm) Weight: 58.4 kg (128 lb 12 oz) IBW/kg (Calculated) : 52.4  Vital Signs: Temp: 96.2 F (35.7 C) (10/25 1230) Temp Source: Axillary (10/25 1215) BP: 121/45 (10/25 1230) Pulse Rate: 97 (10/25 1215)  Labs: Recent Labs    12/27/20 0418 12/28/20 0543 12/28/20 1603 12/28/20 1603 12/29/20 0159 12/29/20 0310 12/29/20 1152  HGB 8.2* 7.3*  --   --   --  7.1*  --   HCT 22.8* 20.0*  --   --   --  19.6*  --   PLT 212 186  --   --   --  103*  --   LABPROT 17.2* 16.0*  --   --   --  14.5  --   INR 1.4* 1.3*  --   --   --  1.1  --   HEPARINUNFRC  --   --  <0.10*   < > >1.10* 0.25* 0.28*  CREATININE 2.69* 3.49*  3.53* 2.50*  --   --  2.05*  --    < > = values in this interval not displayed.     Estimated Creatinine Clearance: 21.4 mL/min (A) (by C-G formula based on SCr of 2.05 mg/dL (H)).  Assessment: 70 year old female to begin warfarin for Afib, s/p PPM this admission.   INR increased to 6.3 after one dose of warfarin - reversed with vitamin K 5 mg. INR now down to 1.1  No s/sx of bleeding. H/h stable. Patient currently NPO and unable to take PO meds so now resuming heparin.   Patient also on CRRT. Heprin has been removed from CRRT and converted to systemic heparin.   Heparin level this afternoon 0.28 on heparin at 1050 units/hr.  No overt bleeding or complications noted.  No known issues with IV infusion.  Goal of Therapy:  Heparin level = 0.3-0.7  INR 2-3 Monitor platelets by anticoagulation protocol: Yes   Plan:  Inc heparin to 1150 units/hr Check HL in 8 hours Check HL and CBC daily while on heparin  Cathrine Muster, PharmD PGY2 Cardiology Pharmacy Resident Phone: [phone] 12/29/2020  12:39 PM  Please check [email or web address] for unit-specific  pharmacy phone numbers.

## 2020-12-29 NOTE — Progress Notes (Signed)
ANTICOAGULATION CONSULT NOTE - Follow Up Consult  Pharmacy Consult for Warfarin > heparin Indication: atrial fibrillation  Allergies  Allergen Reactions   Codeine Itching    Patient Measurements: Height: 5\' 3"  (160 cm) Weight: 67.9 kg (149 lb 11.1 oz) IBW/kg (Calculated) : 52.4  Vital Signs: Temp: 97.5 F (36.4 C) (10/24 2300) Temp Source: Axillary (10/24 2300) BP: 103/58 (10/25 0300) Pulse Rate: 118 (10/24 1930)  Labs: Recent Labs    12/26/20 1413 12/27/20 0418 12/27/20 0418 12/28/20 0543 12/28/20 1603 12/29/20 0159 12/29/20 0310  HGB  --  8.2*   < > 7.3*  --   --  7.1*  HCT  --  22.8*  --  20.0*  --   --  19.6*  PLT  --  212  --  186  --   --  PENDING  LABPROT 55.4* 17.2*  --  16.0*  --   --   --   INR 6.3* 1.4*  --  1.3*  --   --   --   HEPARINUNFRC  --   --   --   --  <0.10* >1.10* 0.25*  CREATININE  --  2.69*  --  3.49*  3.53* 2.50*  --   --    < > = values in this interval not displayed.     Estimated Creatinine Clearance: 19.6 mL/min (A) (by C-G formula based on SCr of 2.5 mg/dL (H)).   Assessment: 70 year old female to begin warfarin for Afib, s/p PPM this admission.   INR increased to 6.3 after one dose of warfarin - reversed with vitamin K 5 mg. INR now down to 1.3  No s/sx of bleeding. H/h stable. Overnight increased sedation and respiratory rate possible aspiration > now on Bipap and NPO  Holding warfarin start heparin drip for now Stop heparin in CRRT and convert to systemic heparin   Heparin level this evening undetectable on heparin at 800 units/hr.  No overt bleeding or complications noted.  No known issues with IV infusion.  10/25 AM update:  Heparin level low at 0.25  Goal of Therapy:  Heparin level = 0.3-0.7  INR 2-3 Monitor platelets by anticoagulation protocol: Yes   Plan:  Inc heparin to 1050 units/hr 1200 heparin level  Narda Bonds, PharmD, BCPS Clinical Pharmacist Phone: [phone]

## 2020-12-29 NOTE — Plan of Care (Signed)

## 2020-12-29 NOTE — Progress Notes (Signed)
Subjective:   no UOP-  remains on CRRT overnight after vascath was replaced-  ended up 2 liters net negative -   BP soft this AM and still hypoxic when comes off of bipap-  HOH-  unclear how much she understands-  per son was independent PTA-  driving, socializing   Objective Vital signs in last 24 hours: Vitals:   12/29/20 0408 12/29/20 0409 12/29/20 0500 12/29/20 0600  BP:   (!) 95/57 (!) 87/51  Pulse: (!) 106     Resp: 15  20 15   Temp:      TempSrc:      SpO2: 98% 98%  93%  Weight:   58.4 kg   Height:       Weight change: -9.5 kg  Intake/Output Summary (Last 24 hours) at 12/29/2020 0810 Last data filed at 12/29/2020 0600 Gross per 24 hour  Intake 1057.51 ml  Output 3171 ml  Net -2113.49 ml    Assessment/ Plan: Pt is a 70 y.o. yo female with stage 5 CKD at baseline who was admitted on 12/06/2020 with Afib/RVR- then brady-  req pacemaker  Assessment/Plan: 1. Renal-  stage 5 CKD at baseline-  has crossed the threshold to being dialysis requiring this admit-  ESRD-   did IHD times 2 with minimal volume removal-  then decision was made to do CRRT on 10/23 to make more of an impact with volume -   100 per hour negative-  still with edema so will continue with CRRT today 2. HTN/vol-   has become more of an issue and also with hemodynamic instability-  started on CRRT -  removing 100 per hour-  hopefully to decrease O2 req-  nursing will inc UF as able-  has not made a huge impact yet 3. Anemia- complicating factor-  will inc ESA-  may need transfusion before said and done, will write for one unit today-  would prefer not to give IV iron given question of sepsis on vanc and cefepime 4. Secondary hyperparathyroidism-  phos OK-  decreasing on crrt- no repletion needed yet 5. Elytes-  all 4 K bath-  k 4.3 today    Louis Meckel    Labs: Basic Metabolic Panel: Recent Labs  Lab 12/28/20 0543 12/28/20 1603 12/29/20 0310  NA 134*  133* 135 133*  K 4.5  4.5 4.4 4.3  CL 99   99 100 101  CO2 24  23 25 25   GLUCOSE 122*  123* 116* 120*  BUN 33*  34* 25* 21  CREATININE 3.49*  3.53* 2.50* 2.05*  CALCIUM 8.8*  8.8* 9.0 8.6*  PHOS 4.4  4.6 3.3 2.9   Liver Function Tests: Recent Labs  Lab 12/23/20 0132 12/24/20 0354 12/28/20 0543 12/28/20 1603 12/29/20 0310  AST 35 15  --   --   --   ALT 6 5  --   --   --   ALKPHOS 224* 165*  --   --   --   BILITOT 0.3 0.3  --   --   --   PROT 6.1* 5.4*  --   --   --   ALBUMIN 2.5* 2.1* 1.6* 1.7* 1.6*   No results for input(s): LIPASE, AMYLASE in the last 168 hours. No results for input(s): AMMONIA in the last 168 hours. CBC: Recent Labs  Lab 12/23/20 0132 12/24/20 0354 12/24/20 1737 12/25/20 0349 12/26/20 0348 12/27/20 0418 12/28/20 0543 12/29/20 0310  WBC 21.9* 15.4*  --  17.2* 19.0*  19.0* 17.5* 21.1*  NEUTROABS 15.5* 12.2*  --   --   --   --   --   --   HGB 9.9* 8.8*   < > 8.8* 8.5* 8.2* 7.3* 7.1*  HCT 29.9* 26.7*   < > 25.9* 24.2* 22.8* 20.0* 19.6*  MCV 107.9* 105.5*  --  102.8* 99.2 97.0 95.2 95.6  PLT 273 258  --  283 290 212 186 103*   < > = values in this interval not displayed.   Cardiac Enzymes: No results for input(s): CKTOTAL, CKMB, CKMBINDEX, TROPONINI in the last 168 hours. CBG: Recent Labs  Lab 12/28/20 0410 12/28/20 0813 12/28/20 1136 12/28/20 2013 12/29/20 0003  GLUCAP 104* 110* 104* 93 107*    Iron Studies: No results for input(s): IRON, TIBC, TRANSFERRIN, FERRITIN in the last 72 hours. Studies/Results: DG CHEST PORT 1 VIEW  Result Date: 12/27/2020 CLINICAL DATA:  Central line placement EXAM: PORTABLE CHEST 1 VIEW COMPARISON:  12/27/2020 FINDINGS: Right internal jugular Vas-Cath placed with the tip in the right innominate vein. No pneumothorax. Bilateral airspace disease, stable or slightly worsened since prior study. Small bilateral pleural effusions. Heart is normal size. Left pacer remains in place, unchanged. IMPRESSION: Right internal jugular Vas-Cath tip in the right  innominate vein. No pneumothorax. Patchy bilateral airspace disease, stable or slightly worsened since prior study. Small bilateral layering effusions. Electronically Signed   By: Rolm Baptise M.D.   On: 12/27/2020 23:59   Medications: Infusions:   prismasol BGK 4/2.5 400 mL/hr at 12/29/20 0515    prismasol BGK 4/2.5 200 mL/hr at 12/28/20 0410   sodium chloride     sodium chloride     sodium chloride     amiodarone 30 mg/hr (12/29/20 0600)   ceFEPime (MAXIPIME) IV Stopped (12/29/20 0308)   heparin 1,050 Units/hr (12/29/20 0600)   norepinephrine (LEVOPHED) Adult infusion 2 mcg/min (12/29/20 0600)   prismasol BGK 4/2.5 1,000 mL/hr at 12/29/20 0400   vancomycin Stopped (12/28/20 1231)   vasopressin 0.03 Units/min (12/29/20 0634)    Scheduled Medications:  chlorhexidine  15 mL Mouth Rinse BID   Chlorhexidine Gluconate Cloth  6 each Topical Daily   [START ON 01/03/2021] darbepoetin (ARANESP) injection - NON-DIALYSIS  200 mcg Subcutaneous Q Sun-1800   insulin aspart  1-3 Units Subcutaneous Q4H   mouth rinse  15 mL Mouth Rinse q12n4p   sodium chloride flush  10-40 mL Intracatheter Q12H    have reviewed scheduled and prn medications.  Physical Exam: General: frail-  eyes wide open-  on bipap-  anxiety.distress Heart: RRR Lungs: poor effort, CBS bilat Abdomen: distended-  non tender Extremities: positive peripheral/ sacral edema Dialysis Access: right femoral HD cath placed 10/24    12/29/2020,8:10 AM  LOS: 10 days

## 2020-12-30 ENCOUNTER — Inpatient Hospital Stay (HOSPITAL_COMMUNITY): Payer: Medicare Other

## 2020-12-30 DIAGNOSIS — I4891 Unspecified atrial fibrillation: Secondary | ICD-10-CM | POA: Diagnosis not present

## 2020-12-30 DIAGNOSIS — E43 Unspecified severe protein-calorie malnutrition: Secondary | ICD-10-CM | POA: Insufficient documentation

## 2020-12-30 DIAGNOSIS — J9601 Acute respiratory failure with hypoxia: Secondary | ICD-10-CM | POA: Diagnosis not present

## 2020-12-30 LAB — PHOSPHORUS
Phosphorus: 2.3 mg/dL — ABNORMAL LOW (ref 2.5–4.6)
Phosphorus: 1.9 mg/dL — ABNORMAL LOW (ref 2.5–4.6)

## 2020-12-30 LAB — BPAM RBC
Blood Product Expiration Date: 202211212359
ISSUE DATE / TIME: 202210251155
Unit Type and Rh: 5100

## 2020-12-30 LAB — RENAL FUNCTION PANEL
Albumin: 1.7 g/dL — ABNORMAL LOW (ref 3.5–5.0)
Albumin: 1.7 g/dL — ABNORMAL LOW (ref 3.5–5.0)
Anion gap: 8 (ref 5–15)
Anion gap: 7 (ref 5–15)
BUN: 14 mg/dL (ref 8–23)
BUN: 14 mg/dL (ref 8–23)
CO2: 25 mmol/L (ref 22–32)
CO2: 26 mmol/L (ref 22–32)
Calcium: 9.2 mg/dL (ref 8.9–10.3)
Calcium: 8.8 mg/dL — ABNORMAL LOW (ref 8.9–10.3)
Chloride: 102 mmol/L (ref 98–111)
Chloride: 102 mmol/L (ref 98–111)
Creatinine, Ser: 1.44 mg/dL — ABNORMAL HIGH (ref 0.44–1.00)
Creatinine, Ser: 1.49 mg/dL — ABNORMAL HIGH (ref 0.44–1.00)
GFR, Estimated: 39 mL/min — ABNORMAL LOW (ref >60)
GFR, Estimated: 38 mL/min — ABNORMAL LOW (ref >60)
Glucose, Bld: 108 mg/dL — ABNORMAL HIGH (ref 70–99)
Glucose, Bld: 133 mg/dL — ABNORMAL HIGH (ref 70–99)
Phosphorus: 2.3 mg/dL — ABNORMAL LOW (ref 2.5–4.6)
Phosphorus: 1.6 mg/dL — ABNORMAL LOW (ref 2.5–4.6)
Potassium: 4.3 mmol/L (ref 3.5–5.1)
Potassium: 3.6 mmol/L (ref 3.5–5.1)
Sodium: 135 mmol/L (ref 135–145)
Sodium: 135 mmol/L (ref 135–145)

## 2020-12-30 LAB — TYPE AND SCREEN
ABO/RH(D): O POS
Antibody Screen: NEGATIVE
Unit division: 0

## 2020-12-30 LAB — GLUCOSE, CAPILLARY
Glucose-Capillary: 104 mg/dL — ABNORMAL HIGH (ref 70–99)
Glucose-Capillary: 119 mg/dL — ABNORMAL HIGH (ref 70–99)
Glucose-Capillary: 147 mg/dL — ABNORMAL HIGH (ref 70–99)
Glucose-Capillary: 86 mg/dL (ref 70–99)
Glucose-Capillary: 90 mg/dL (ref 70–99)
Glucose-Capillary: 91 mg/dL (ref 70–99)
Glucose-Capillary: 97 mg/dL (ref 70–99)

## 2020-12-30 LAB — MAGNESIUM
Magnesium: 2.4 mg/dL (ref 1.7–2.4)
Magnesium: 2.5 mg/dL — ABNORMAL HIGH (ref 1.7–2.4)
Magnesium: 2.4 mg/dL (ref 1.7–2.4)

## 2020-12-30 LAB — CBC
HCT: 26.5 % — ABNORMAL LOW (ref 36.0–46.0)
Hemoglobin: 9.2 g/dL — ABNORMAL LOW (ref 12.0–15.0)
MCH: 32.2 pg (ref 26.0–34.0)
MCHC: 34.7 g/dL (ref 30.0–36.0)
MCV: 92.7 fL (ref 80.0–100.0)
Platelets: 87 K/uL — ABNORMAL LOW (ref 150–400)
RBC: 2.86 MIL/uL — ABNORMAL LOW (ref 3.87–5.11)
RDW: 16.2 % — ABNORMAL HIGH (ref 11.5–15.5)
WBC: 24.1 K/uL — ABNORMAL HIGH (ref 4.0–10.5)
nRBC: 12.2 % — ABNORMAL HIGH (ref 0.0–0.2)

## 2020-12-30 LAB — HEPARIN INDUCED PLATELET AB (HIT ANTIBODY): Heparin Induced Plt Ab: 0.078 OD (ref 0.000–0.400)

## 2020-12-30 LAB — HEPARIN LEVEL (UNFRACTIONATED)
Heparin Unfractionated: <0.1 IU/mL — ABNORMAL LOW (ref 0.30–0.70)
Heparin Unfractionated: <0.1 IU/mL — ABNORMAL LOW (ref 0.30–0.70)

## 2020-12-30 LAB — PROTIME-INR
INR: 1.3 — ABNORMAL HIGH (ref 0.8–1.2)
Prothrombin Time: 16.1 seconds — ABNORMAL HIGH (ref 11.4–15.2)

## 2020-12-30 MED ORDER — ACETAMINOPHEN 325 MG PO TABS: 650.0000 mg | ORAL_TABLET | Freq: Four times a day (QID) | ORAL | Status: DC | PRN

## 2020-12-30 MED ORDER — SENNOSIDES-DOCUSATE SODIUM 8.6-50 MG PO TABS
1.0000 | ORAL_TABLET | Freq: Two times a day (BID) | ORAL | Status: DC
  Administered 2020-12-30 (×2): 1 via ORAL
  Filled 2020-12-30 (×2): qty 1

## 2020-12-30 MED ORDER — K PHOS MONO-SOD PHOS DI & MONO 155-852-130 MG PO TABS
500.0000 mg | ORAL_TABLET | Freq: Four times a day (QID) | ORAL | Status: AC
Start: 2020-12-30 — End: 2020-12-30
  Administered 2020-12-30 (×2): 500 mg
  Filled 2020-12-30 (×2): qty 2

## 2020-12-30 MED ORDER — POLYETHYLENE GLYCOL 3350 17 G PO PACK
17.0000 g | PACK | Freq: Two times a day (BID) | ORAL | Status: DC
  Administered 2020-12-30 – 2020-12-31 (×4): 17 g via ORAL
  Filled 2020-12-30 (×4): qty 1

## 2020-12-30 MED ORDER — PROSOURCE TF PO LIQD
90.0000 mL | Freq: Three times a day (TID) | ORAL | Status: DC
  Administered 2020-12-30 – 2020-12-31 (×5): 90 mL
  Filled 2020-12-30 (×5): qty 90

## 2020-12-30 MED ORDER — VITAL 1.5 CAL PO LIQD
1000.0000 mL | ORAL | Status: DC
Start: 2020-12-30 — End: 2021-01-01
  Administered 2020-12-30: 1000 mL

## 2020-12-30 MED ORDER — VITAL HIGH PROTEIN PO LIQD: 1000.0000 mL | ORAL | Status: DC

## 2020-12-30 MED ORDER — ACETAMINOPHEN 650 MG RE SUPP: 650.0000 mg | Freq: Four times a day (QID) | RECTAL | Status: DC | PRN

## 2020-12-30 MED ORDER — MIDODRINE HCL 5 MG PO TABS
10.0000 mg | ORAL_TABLET | Freq: Three times a day (TID) | ORAL | Status: DC
  Administered 2020-12-30 – 2020-12-31 (×5): 10 mg via ORAL
  Filled 2020-12-30 (×5): qty 2

## 2020-12-30 MED ORDER — TRAMADOL HCL 50 MG PO TABS
50.0000 mg | ORAL_TABLET | Freq: Four times a day (QID) | ORAL | Status: DC | PRN
Start: 2020-12-30 — End: 2021-01-01

## 2020-12-30 MED ORDER — SODIUM PHOSPHATES 45 MMOLE/15ML IV SOLN
30.0000 mmol | Freq: Once | INTRAVENOUS | Status: DC
  Filled 2020-12-30: qty 10

## 2020-12-30 MED ORDER — PROSOURCE TF PO LIQD: 45.0000 mL | Freq: Two times a day (BID) | ORAL | Status: DC

## 2020-12-30 MED ORDER — SODIUM BICARBONATE 650 MG PO TABS
1300.0000 mg | ORAL_TABLET | Freq: Three times a day (TID) | ORAL | Status: DC
  Administered 2020-12-30 (×2): 1300 mg via ORAL
  Filled 2020-12-30 (×2): qty 2

## 2020-12-30 MED ORDER — MIDODRINE HCL 5 MG PO TABS: 5.0000 mg | ORAL_TABLET | Freq: Three times a day (TID) | ORAL | Status: DC

## 2020-12-30 MED ORDER — VITAL 1.5 CAL PO LIQD: 1000.0000 mL | ORAL | Status: DC

## 2020-12-30 NOTE — Progress Notes (Signed)
Subjective:   no UOP-  remains on CRRT overnight -  ended up another 2.2 liters net negative -   BP soft this AM and still hypoxic but better-  HOH-  unclear how much she understands-  per son was independent PTA-  driving, socializing   Objective Vital signs in last 24 hours: Vitals:   12/30/20 0715 12/30/20 0730 12/30/20 0744 12/30/20 0745  BP: (!) 98/59 (!) 97/50  95/63  Pulse:      Resp: 15 14  (!) 21  Temp:      TempSrc:      SpO2: 96% 91% 95% 95%  Weight:      Height:       Weight change: -2.2 kg  Intake/Output Summary (Last 24 hours) at 12/30/2020 0757 Last data filed at 12/30/2020 0700 Gross per 24 hour  Intake 1549.32 ml  Output 3825 ml  Net -2275.68 ml    Assessment/ Plan: Pt is a 70 y.o. yo female with stage 5 CKD at baseline who was admitted on 12/30/2020 with Afib/RVR- then brady-  req pacemaker  Assessment/Plan: 1. Renal-  stage 5 CKD at baseline-  has crossed the threshold to being dialysis requiring this admit-  ESRD-   did IHD times 2 with minimal volume removal-  then decision was made to do CRRT on 10/23 to make more of an impact with volume -   100 per hour negative-  still with edema, hypoxia and hypotension so will continue with CRRT today.  Will need to eventually change her access to [facility]-  plans were for PD eventually but will need to be more stable  2. HTN/vol-   has become more of an issue and also with hemodynamic instability-  started on CRRT -  removing 100 per hour-  hopefully to decrease O2 req-  nursing will inc UF as able-   once coretrak in can start midodrine to see if can get off pressors 3. Anemia- complicating factor-  have inc ESA-   transfusion 10/25-  would prefer not to give IV iron given question of sepsis on vanc and cefepime 4. Secondary hyperparathyroidism-  phos decreasing on crrt, will replete-  calc OK 5. Elytes-  all 4 K bath-  k 4.3 today    Louis Meckel    Labs: Basic Metabolic Panel: Recent Labs  Lab  12/29/20 0310 12/29/20 1607 12/30/20 0307  NA 133* 134* 135  K 4.3 4.5 4.3  CL 101 101 102  CO2 25 24 25   GLUCOSE 120* 109* 108*  BUN 21 19 14   CREATININE 2.05* 1.69* 1.44*  CALCIUM 8.6* 9.0 9.2  PHOS 2.9 3.1 2.3*  2.3*   Liver Function Tests: Recent Labs  Lab 12/24/20 0354 12/28/20 0543 12/29/20 0310 12/29/20 1607 12/30/20 0307  AST 15  --   --   --   --   ALT 5  --   --   --   --   ALKPHOS 165*  --   --   --   --   BILITOT 0.3  --   --   --   --   PROT 5.4*  --   --   --   --   ALBUMIN 2.1*   < > 1.6* 1.8* 1.7*   < > = values in this interval not displayed.   No results for input(s): LIPASE, AMYLASE in the last 168 hours. No results for input(s): AMMONIA in the last 168 hours. CBC: Recent Labs  Lab  12/24/20 0354 12/24/20 1737 12/26/20 0348 12/27/20 0418 12/28/20 0543 12/29/20 0310 12/30/20 0307  WBC 15.4*   < > 19.0* 19.0* 17.5* 21.1* 24.1*  NEUTROABS 12.2*  --   --   --   --   --   --   HGB 8.8*   < > 8.5* 8.2* 7.3* 7.1* 9.2*  HCT 26.7*   < > 24.2* 22.8* 20.0* 19.6* 26.5*  MCV 105.5*   < > 99.2 97.0 95.2 95.6 92.7  PLT 258   < > 290 212 186 103* 87*   < > = values in this interval not displayed.   Cardiac Enzymes: No results for input(s): CKTOTAL, CKMB, CKMBINDEX, TROPONINI in the last 168 hours. CBG: Recent Labs  Lab 12/29/20 1144 12/29/20 1557 12/29/20 2128 12/30/20 0005 12/30/20 0329  GLUCAP 100* 108* 98 97 86    Iron Studies: No results for input(s): IRON, TIBC, TRANSFERRIN, FERRITIN in the last 72 hours. Studies/Results: No results found. Medications: Infusions:   prismasol BGK 4/2.5 400 mL/hr at 12/30/20 0007    prismasol BGK 4/2.5 200 mL/hr at 12/30/20 1683   sodium chloride     sodium chloride     sodium chloride     amiodarone 30 mg/hr (12/30/20 0700)   ceFEPime (MAXIPIME) IV Stopped (12/29/20 2239)   heparin 1,400 Units/hr (12/30/20 0700)   norepinephrine (LEVOPHED) Adult infusion 10 mcg/min (12/30/20 0700)   prismasol BGK  4/2.5 1,000 mL/hr at 12/30/20 0011   vancomycin Stopped (12/29/20 1309)   vasopressin Stopped (12/30/20 0445)    Scheduled Medications:  chlorhexidine  15 mL Mouth Rinse BID   Chlorhexidine Gluconate Cloth  6 each Topical Daily   [START ON 01/03/2021] darbepoetin (ARANESP) injection - NON-DIALYSIS  200 mcg Subcutaneous Q Sun-1800   insulin aspart  1-3 Units Subcutaneous Q4H   mouth rinse  15 mL Mouth Rinse q12n4p   sodium chloride flush  10-40 mL Intracatheter Q12H    have reviewed scheduled and prn medications.  Physical Exam: General: frail-  eyes wide open-  on bipap-  anxiety.distress Heart: RRR Lungs: poor effort, CBS bilat Abdomen: distended-  non tender Extremities: positive peripheral/ sacral edema but better  Dialysis Access: right femoral HD cath placed 10/24    12/30/2020,7:57 AM  LOS: 11 days

## 2020-12-30 NOTE — Progress Notes (Signed)
ANTICOAGULATION CONSULT NOTE - Follow Up Consult  Pharmacy Consult for Warfarin > heparin Indication: atrial fibrillation  Allergies  Allergen Reactions   Codeine Itching    Patient Measurements: Height: 5\' 3"  (160 cm) Weight: 58.4 kg (128 lb 12 oz) IBW/kg (Calculated) : 52.4  Vital Signs: Temp: 97.3 F (36.3 C) (10/25 2000) Temp Source: Axillary (10/25 2000) BP: 106/76 (10/26 0400)  Labs: Recent Labs    12/28/20 0543 12/28/20 1603 12/29/20 0310 12/29/20 1152 12/29/20 1607 12/29/20 1748 12/29/20 2130 12/30/20 0307  HGB 7.3*  --  7.1*  --   --   --   --  9.2*  HCT 20.0*  --  19.6*  --   --   --   --  26.5*  PLT 186  --  103*  --   --   --   --  87*  LABPROT 16.0*  --  14.5  --   --   --   --  16.1*  INR 1.3*  --  1.1  --   --   --   --  1.3*  HEPARINUNFRC  --    < > 0.25*   < >  --  <0.10* <0.10* <0.10*  CREATININE 3.49*  3.53*   < > 2.05*  --  1.69*  --   --  1.44*   < > = values in this interval not displayed.     Estimated Creatinine Clearance: 30.5 mL/min (A) (by C-G formula based on SCr of 1.44 mg/dL (H)).   Assessment: 70 year old female to begin warfarin for Afib, s/p PPM this admission.   INR increased to 6.3 after one dose of warfarin - reversed with vitamin K 5 mg. INR now down to 1.3  No s/sx of bleeding. H/h stable. Overnight increased sedation and respiratory rate possible aspiration > now on Bipap and NPO  Holding warfarin start heparin drip for now Stop heparin in CRRT and convert to systemic heparin   Heparin level this evening undetectable on heparin at 800 units/hr.  No overt bleeding or complications noted.  No known issues with IV infusion.  10/26 AM update:  Heparin level low  Hgb 7.1>>>9.2 INR 1.3  Goal of Therapy:  Heparin level = 0.3-0.7  INR 2-3 Monitor platelets by anticoagulation protocol: Yes   Plan:  Inc heparin to 1400 units/hr 1300 heparin level  Narda Bonds, PharmD, BCPS Clinical Pharmacist Phone:  [phone]

## 2020-12-30 NOTE — Plan of Care (Signed)

## 2020-12-30 NOTE — Progress Notes (Signed)
Was called to room by RN.  Patient had a desat episode.  Patient on Heated HFNC.  Patient was on 35L with 80% FIO2.  Raised patient up to 90% FIO2 and patient went up to 100% within 2 minutes.  Left FIO2 alone and turned down pressure to 30L.  Patient was at 98%.  Left this setting and will report to oncoming RT to wean as able.

## 2020-12-30 NOTE — Progress Notes (Signed)
Physical Therapy Treatment/Reevaluation Patient Details Name: Debra Petersen MRN: [id number] DOB: [birth year] Today's Date: 12/30/2020   History of Present Illness Pt is a 70 y.o. female admitted 12/19/2020 with weakness, palpitations, episodes of near syncope. Found to be in rapid AF, significant pauses. Urgent temporary pacer placed 10/15. Now s/p St Jude PPM placement on 10/17. Course complicated by hypotension overnight 10/19 requiring low vasopressor support, sepsis, and pericardial effusion. RIJ temp HD cath placed and HD initiated 10/21, performed twice but insufficient so CRRT started 10/23.Marland Kitchen Pt with hypoxia requiring NRB/eated high flow/bipap 10/21. PMH includes HTN, CKD IV, aflutter, PAF, atrial tachycardia.    PT Comments    Pt with decline in medical status now requiring CRRT.  Treatment limited due to precautions associated with HD femoral catheter.  Spoke with RN who recommended bed exercises.  Considered Tilt bed due to precautions but RN reports hopeful for more permanent HD placement end of the week.  Pt was confused but participated with exercises with AAROM/tactile cues and assist to maintain safe ROM on R LE.  She is still moving extremities well, expect can progress once able to get OOB.  Did update d/c recommendations.     Recommendations for follow up therapy are one component of a multi-disciplinary discharge planning process, led by the attending physician.  Recommendations may be updated based on patient status, additional functional criteria and insurance authorization.  Follow Up Recommendations  Acute inpatient rehab (3hours/day)     Assistance Recommended at Discharge Frequent or constant Supervision/Assistance  Equipment Recommendations  Rollator (4 wheels)    Recommendations for Other Services       Precautions / Restrictions Precautions Precautions: Fall;ICD/Pacemaker;Other (comment) Precaution Comments: Watch vitals; CRRT with R femoral access (limit hip  flexion)     Mobility  Bed Mobility               General bed mobility comments: Deferred due to femoral access HD catheter    Transfers                        Ambulation/Gait                 Stairs             Wheelchair Mobility    Modified Rankin (Stroke Patients Only)       Balance                                            Cognition Arousal/Alertness: Awake/alert Behavior During Therapy: Anxious Overall Cognitive Status: Impaired/Different from baseline                                 General Comments: Pt appears anxious and not responding consistently to verbal commands. Did participate with exercises with tactile cues.  Son reports she has seemed confused. Pt did not speak during session.        Exercises General Exercises - Lower Extremity Ankle Circles/Pumps: Both;15 reps;Supine;AAROM Short Arc Quad: Both;10 reps;Supine;AAROM Heel Slides: AAROM;Both;10 reps;Supine (limited motion on R side due to HD catheter) Hip ABduction/ADduction: AAROM;Both;10 reps;Supine Other Exercises Other Exercises: Partial roll/min trunk rotation: x 10 bil AAROM Other Exercises: All with light AAROM and tactile cues.  Cues for limited motion R hip due to R femoral HD catheter  General Comments        Pertinent Vitals/Pain Pain Assessment: No/denies pain Facial Expression: Relaxed, neutral Body Movements: Absence of movements Muscle Tension: Relaxed Compliance with ventilator (intubated pts.): N/A Vocalization (extubated pts.): Talking in normal tone or no sound CPOT Total: 0    Home Living                          Prior Function            PT Goals (current goals can now be found in the care plan section) Acute Rehab PT Goals Patient Stated Goal: pt unable to state - confused PT Goal Formulation: With patient/family Time For Goal Achievement: 01/13/21 Potential to Achieve Goals:  Good Progress towards PT goals: Not progressing toward goals - comment;Goals downgraded-see care plan (significant decline in medical status)    Frequency    Min 3X/week      PT Plan Discharge plan needs to be updated    Co-evaluation              AM-PAC PT "6 Clicks" Mobility   Outcome Measure  Help needed turning from your back to your side while in a flat bed without using bedrails?: A Little Help needed moving from lying on your back to sitting on the side of a flat bed without using bedrails?: A Lot Help needed moving to and from a bed to a chair (including a wheelchair)?: A Lot Help needed standing up from a chair using your arms (e.g., wheelchair or bedside chair)?: A Lot Help needed to walk in hospital room?: Total Help needed climbing 3-5 steps with a railing? : Total 6 Click Score: 11    End of Session Equipment Utilized During Treatment: Oxygen Activity Tolerance: Treatment limited secondary to medical complications (Comment);Patient tolerated treatment well (Tolerated well but limited due to CRRT, Femoral HD catheter, on heated high flow O2) Patient left: in bed;with family/visitor present;with call bell/phone within reach Nurse Communication: Mobility status PT Visit Diagnosis: Other abnormalities of gait and mobility (R26.89);Muscle weakness (generalized) (M62.81)     Time: 4010-2725 PT Time Calculation (min) (ACUTE ONLY): 13 min  Charges:  $Therapeutic Exercise: 8-22 mins                     Abran Richard, PT Acute Rehab Services Pager [phone] [facility] 12/30/2020, 1:48 PM

## 2020-12-30 NOTE — Progress Notes (Signed)
NAME:  Debra Petersen, MRN:  [id number], DOB:  [birth year], LOS: 48 ADMISSION DATE:  12/10/2020 CONSULTATION DATE:  12/23/2020 REFERRING MD:  Tyrell Antonio - TR[facility] CHIEF COMPLAINT: Hypotension, possible sepsis   History of Present Illness:  70 year old woman who presented to [facility] 10/15 for weakness and increasing dizziness x 2 days and palpitations. 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  In ED, patient was tachycardic to 130s with telemetry demonstrating Afib with RVR. Cardizem gtt was started at slow rate, given patient report of mild bradycardia at home rates 40s-60s). Heparin gtt was initiated for [facility]. Troponin was elevated to 1843, presumed secondary to demand ischemia. Patient was admitted to [facility] with Cardiology/Nephrology consults.   After 4-5 second pause noted on tele 10/15 with variable HR (50s to 130s), concern raised for sick sinus syndrome and EP was consulted, given complex management of antiarrhythmics in the setting of CKD. Underwent temp pacer placement 10/16 with subsequent permanent pacemaker placement 10/17.  On 10/18PM, patient had brief period of hypotension prompting peripheral low-dose pressor initiation. Additionally, Cr worsened with concern for ARF superimposed on CKD. PCCM was consulted for hypotension/ARF with concern for sepsis.  Pertinent Medical History:   Past Medical History:  Diagnosis Date   CKD (chronic kidney disease)    Lupus (systemic lupus erythematosus) (HCC)    Wide-complex tachycardia    probable atrial flutter with 1-1 and 2-1 conduction   Significant Hospital Events: Including procedures, antibiotic start and stop dates in addition to other pertinent events   10/19 - PCCM consulted in the setting of hypotension with possible sepsis, low-dose pressor requirement. ECHO with  new pericardial effusion 10/20 - re consulted for recurrent hypotension req pressors. C/o chest pain. Needs HD cath placed. Broadening abx   10/21 chest pain feeling better. Got first HD run this morning 10/22 on Nrb. More confused. On low dose NE.  10/23 on NE, vaso and BiPAP after 1L volume removal iHD overnight  10/24 dialysis catheter repositioned.  Patient did not receive adequate ultrafiltration overnight 10/25 continues to have marginal saturations off BiPAP 10/26 weaned off BiPAP to heated high flow nasal cannula.  Interim History / Subjective:  Breathing remains comfortable.  Tolerating CRRT with decreasing vasopressor requirements.  Remains confused.  According to son at appears to be sleeping well at night.  Objective:  Blood pressure 95/63, pulse 97, temperature (!) 97.3 F (36.3 C), temperature source Axillary, resp. rate (!) 21, height 5\' 3"  (1.6 m), weight 56.2 kg, SpO2 95 %.    Vent Mode: BIPAP;PCV FiO2 (%):  [60 %-90 %] 90 % Set Rate:  [15 bmp] 15 bmp PEEP:  [8 cmH20] 8 cmH20   Intake/Output Summary (Last 24 hours) at 12/30/2020 0817 Last data filed at 12/30/2020 0700 Gross per 24 hour  Intake 1549.32 ml  Output 3770 ml  Net -2220.68 ml    Filed Weights   12/28/20 0500 12/29/20 0500 12/30/20 0500  Weight: 67.9 kg 58.4 kg 56.2 kg   Physical Examination: General: Frail.  In no distress Neuro: confused, continually asks for repetition. Generalized weakness.  HEENT: BiPAP with good seal.  Sclera are pale. CV: irir s1s1 no rgm  PULM: Fine crackles throughout both lungs. GI: soft ndnt + bowel sounds  Extremities: Bilateral edema predominantly in dependent zones. Markedly decreased muscle mass.  Skin: pale c/d/w. Scattered bruising  Resolved Hospital Problem List:    Assessment & Plan:   Critically ill due to acute respiratory failure with hypoxia requiring noninvasive ventilation due to volume overload and bacterial pneumonia Critically ill due to  distributive shock presumably secondary to bacterial pneumonia Critically ill due to atrial fibrillation with rapid ventricular response requiring titration of amiodarone Critically ill due to acute kidney injury superimposed on stage V kidney disease requiring CRRT Anticoagulation related coagulopathy Pericardial effusion without tamponade Dysphagia Cachexia with severe protein calorie malnutrition.    Very slow to improve, improving hypoxia and hypotension with fluid removal and adequate antibiotic coverage.  Significant signs of frailty, may not have physiologic reserve to recover.   Plan:  -Continue CRRT goal fluid removal 100 net fluid removal -Wean vasopressors once midodrine started enterally. -Serial echo shows only small pericardial effusion and normal LV function. -Nasal cannula -Continue IV amiodarone for now until cleared for oral intake. -Start IV heparin systemically for stroke prevention. -Ongoing speech therapy -Cortrak for enteral nutrition.   Best Practice: (right click and "Reselect all SmartList Selections" daily)   Diet-nectar thick  / NPO on BiPAP Code Status: Full  VTE ppx: SCD GI ppx: n/a  Goals of care: 10/24 pt and son at bedside  Lines- temp HD cath RIJ   Labs:  CBC: Recent Labs  Lab 12/24/20 0354 12/24/20 1737 12/26/20 0348 12/27/20 0418 12/28/20 0543 12/29/20 0310 12/30/20 0307  WBC 15.4*   < > 19.0* 19.0* 17.5* 21.1* 24.1*  NEUTROABS 12.2*  --   --   --   --   --   --   HGB 8.8*   < > 8.5* 8.2* 7.3* 7.1* 9.2*  HCT 26.7*   < > 24.2* 22.8* 20.0* 19.6* 26.5*  MCV 105.5*   < > 99.2 97.0 95.2 95.6 92.7  PLT 258   < > 290 212 186 103* 87*   < > = values in this interval not displayed.    Basic Metabolic Panel: Recent Labs  Lab 12/26/20 0348 12/27/20 0418 12/28/20 0543 12/28/20 1603 12/29/20 0310 12/29/20 1607 12/30/20 0307  NA 131* 134* 134*  133* 135 133* 134* 135  K 4.7 3.3* 4.5  4.5 4.4 4.3 4.5 4.3  CL 96* 96* 99  99 100  101 101 102  CO2 23 25 24  23 25 25 24 25   GLUCOSE 99 94 122*  123* 116* 120* 109* 108*  BUN 45* 20 33*  34* 25* 21 19 14   CREATININE 5.90* 2.69* 3.49*  3.53* 2.50* 2.05* 1.69* 1.44*  CALCIUM 9.3 8.7* 8.8*  8.8* 9.0 8.6* 9.0 9.2  MG 3.2* 2.2 2.5*  --  2.5*  --  2.4  PHOS 7.9* 3.6 4.4  4.6 3.3 2.9 3.1 2.3*  2.3*    GFR: Estimated Creatinine Clearance: 30.5 mL/min (A) (by C-G formula based on SCr of 1.44 mg/dL (H)). Recent Labs  Lab 12/23/20 1340 12/23/20 1450 12/24/20 0354 12/24/20 0354 12/25/20 0349 12/26/20 0348 12/27/20 0418 12/28/20 0543 12/29/20 0310 12/30/20 0307  PROCALCITON  --  4.91 6.15  --  17.19  --   --   --   --   --   WBC  --   --  15.4*   < > 17.2*   < > 19.0* 17.5* 21.1* 24.1*  LATICACIDVEN 2.0* 1.1  --   --   --   --   --   --   --   --    < > = values in  this interval not displayed.    Liver Function Tests: Recent Labs  Lab 12/24/20 0354 12/28/20 0543 12/28/20 1603 12/29/20 0310 12/29/20 1607 12/30/20 0307  AST 15  --   --   --   --   --   ALT 5  --   --   --   --   --   ALKPHOS 165*  --   --   --   --   --   BILITOT 0.3  --   --   --   --   --   PROT 5.4*  --   --   --   --   --   ALBUMIN 2.1* 1.6* 1.7* 1.6* 1.8* 1.7*    No results for input(s): LIPASE, AMYLASE in the last 168 hours. No results for input(s): AMMONIA in the last 168 hours.  ABG:    Component Value Date/Time   PHART 7.492 (H) 12/24/2020 1737   PCO2ART 27.7 (L) 12/24/2020 1737   PO2ART 124 (H) 12/24/2020 1737   HCO3 21.2 12/24/2020 1737   TCO2 22 12/24/2020 1737   ACIDBASEDEF 2.0 12/24/2020 1737   O2SAT 99.0 12/24/2020 1737     Coagulation Profile: Recent Labs  Lab 12/26/20 1413 12/27/20 0418 12/28/20 0543 12/29/20 0310 12/30/20 0307  INR 6.3* 1.4* 1.3* 1.1 1.3*     Cardiac Enzymes: No results for input(s): CKTOTAL, CKMB, CKMBINDEX, TROPONINI in the last 168 hours.  HbA1C: Hgb A1c MFr Bld  Date/Time Value Ref Range Status  12/24/2020 04:46 PM 5.0  4.8 - 5.6 % Final    Comment:    (NOTE) Pre diabetes:          5.7%-6.4%  Diabetes:              >6.4%  Glycemic control for   <7.0% adults with diabetes   12/17/2020 01:30 AM 4.9 4.8 - 5.6 % Final    Comment:    (NOTE) Pre diabetes:          5.7%-6.4%  Diabetes:              >6.4%  Glycemic control for   <7.0% adults with diabetes    CBG: Recent Labs  Lab 12/29/20 1144 12/29/20 1557 12/29/20 2128 12/30/20 0005 12/30/20 0329  GLUCAP 100* 108* 98 97 86     CRITICAL CARE Performed by: Kipp Brood   Total critical care time: 40 minutes  Critical care time was exclusive of separately billable procedures and treating other patients. Critical care was necessary to treat or prevent imminent or life-threatening deterioration.  Critical care was time spent personally by me on the following activities: development of treatment plan with patient and/or surrogate as well as nursing, discussions with consultants, evaluation of patient's response to treatment, examination of patient, obtaining history from patient or surrogate, ordering and performing treatments and interventions, ordering and review of laboratory studies, ordering and review of radiographic studies, pulse oximetry and re-evaluation of patient's condition.  Kipp Brood, MD [facility] ICU Physician [place]  Pager: [phone] Or Epic Secure Chat After hours: [phone].  12/30/2020, 8:17 AM     12/30/2020, 8:17 AM

## 2020-12-30 NOTE — Progress Notes (Signed)
Hold heparin gtt in order to transfuse PRBC (resume once done) per Dr. Kipp Brood. Refer to flowsheet for details.

## 2020-12-30 NOTE — Progress Notes (Signed)
  Inpatient Rehab Admissions Coordinator :  Per therapy recommendations patient was screened for CIR candidacy by Danne Baxter RN MSN. Patient is not yet at a level to tolerate the intensity required to pursue a CIR admit due to CRRT and femoral catheter. Patient may have the potential to progress to become a candidate. The CIR admissions team will follow and monitor for progress and place a Rehab Consult order if felt to be appropriate. Please contact me with any questions.  Danne Baxter RN MSN Admissions Coordinator [phone]

## 2020-12-30 NOTE — Evaluation (Signed)
Occupational Therapy Evaluation Patient Details Name: Debra Petersen MRN: [id number] DOB: February 25, 1951 Today's Date: 12/30/2020   History of Present Illness Pt is a 70 y.o. female admitted 12/28/2020 with weakness, palpitations, episodes of near syncope. Found to be in rapid AF, significant pauses. Urgent temporary pacer placed 10/15. Now s/p St Jude PPM placement on 10/17. Course complicated by hypotension overnight 10/19 requiring low vasopressor support, sepsis, and pericardial effusion. RIJ temp HD cath placed and HD initiated 10/21, performed twice but insufficient so CRRT started 10/23.Marland Kitchen Pt with hypoxia requiring NRB/eated high flow/bipap 10/21. PMH includes HTN, CKD IV, aflutter, PAF, atrial tachycardia.   Clinical Impression   Pt was independent and living alone prior to admission. Presents with generalized weakness and impaired cognition. Evaluation limited to bed level due to temporary femoral HD cath which may be removed later this week. Pt can bring R hand to mouth to swab and suction mouth. She is otherwise dependent. Pt likely to progress steadily once HD cath is removed. Will follow acutely.     Recommendations for follow up therapy are one component of a multi-disciplinary discharge planning process, led by the attending physician.  Recommendations may be updated based on patient status, additional functional criteria and insurance authorization.   Follow Up Recommendations  Acute inpatient rehab (3hours/day)    Assistance Recommended at Discharge    Functional Status Assessment  Patient has had a recent decline in their functional status and demonstrates the ability to make significant improvements in function in a reasonable and predictable amount of time.  Equipment Recommendations  [facility]    Recommendations for Other Services       Precautions / Restrictions Precautions Precautions: Fall;ICD/Pacemaker;Other (comment) Precaution Comments: Watch vitals; CRRT with R femoral access  (limit hip flexion), pacemaker      Mobility Bed Mobility               General bed mobility comments: Deferred due to femoral access HD catheter    Transfers                   General transfer comment: deferred, pt with temporary femoral cath      Balance                                           ADL either performed or assessed with clinical judgement   ADL                                         General ADL Comments: pt able to bring oral swab and suction to mouth with R hand, otherwise dependent     Vision Baseline Vision/History: 0 No visual deficits Patient Visual Report: No change from baseline       Perception     Praxis      Pertinent Vitals/Pain Pain Assessment: No/denies pain Facial Expression: Relaxed, neutral Body Movements: Absence of movements Muscle Tension: Relaxed Compliance with ventilator (intubated pts.): N/A Vocalization (extubated pts.): Talking in normal tone or no sound CPOT Total: 0     Hand Dominance Right   Extremity/Trunk Assessment Upper Extremity Assessment Upper Extremity Assessment: RUE deficits/detail;LUE deficits/detail RUE Deficits / Details: edematous upper arm, full AROM, weakness proximally, performed AROM x 10 all areas LUE Deficits / Details: Limited secondary to  pacemaker precautions, performed AROM x 10 all areas LUE Coordination: decreased gross motor   Lower Extremity Assessment Lower Extremity Assessment: Defer to PT evaluation       Communication Communication Communication: No difficulties   Cognition Arousal/Alertness: Awake/alert Behavior During Therapy: Anxious Overall Cognitive Status: Impaired/Different from baseline Area of Impairment: Attention;Following commands;Problem solving                   Current Attention Level: Focused   Following Commands: Follows one step commands with increased time     Problem Solving: Slow  processing;Decreased initiation;Requires verbal cues General Comments: pt aware she is in [facility], her kidneys are bad, month and date, needs reminders to use her voice, educated friend in delirium prevention     General Comments       Exercises    Shoulder Instructions      Home Living Family/patient expects to be discharged to:: Private residence Living Arrangements: Alone Available Help at Discharge: Friend(s);Available 24 hours/day Type of Home: House Home Access: Stairs to enter [employer] of Steps: 3 Entrance Stairs-Rails: Right Home Layout: One level     Bathroom Shower/Tub: Teacher, early years/pre: Standard     Home Equipment: None   Additional Comments: drives      Prior Functioning/Environment Prior Level of Function : Independent/Modified Independent                        OT Problem List: Decreased strength;Decreased activity tolerance      OT Treatment/Interventions: Self-care/ADL training;DME and/or AE instruction;Patient/family education;Balance training;Therapeutic activities    OT Goals(Current goals can be found in the care plan section) Acute Rehab OT Goals Patient Stated Goal: to go home OT Goal Formulation: With patient Time For Goal Achievement: 01/13/21 Potential to Achieve Goals: Good ADL Goals Pt Will Perform Eating: with min assist;sitting Pt Will Perform Grooming: with min assist;sitting Pt Will Perform Upper Body Dressing: with min assist;sitting Pt Will Transfer to Toilet: with mod assist;stand pivot transfer;bedside commode Pt/caregiver will Perform Home Exercise Program: Increased strength;Both right and left upper extremity;With Supervision Additional ADL Goal #1: Pt will perform bed mobility with min assist in preparation for ADL.  OT Frequency: Min 2X/week   Barriers to D/C:            Co-evaluation              AM-PAC OT "6 Clicks" Daily Activity     Outcome Measure Help from  another person eating meals?: Total Help from another person taking care of personal grooming?: Total Help from another person toileting, which includes using toliet, bedpan, or urinal?: Total Help from another person bathing (including washing, rinsing, drying)?: Total Help from another person to put on and taking off regular upper body clothing?: Total Help from another person to put on and taking off regular lower body clothing?: Total 6 Click Score: 6   End of Session    Activity Tolerance: Patient tolerated treatment well;Treatment limited secondary to medical complications (Comment) (temporary femoral catheter) Patient left: in bed;with call bell/phone within reach;with family/visitor present  OT Visit Diagnosis: Muscle weakness (generalized) (M62.81)                Time: 8119-1478 OT Time Calculation (min): 26 min Charges:  OT General Charges $OT Visit: 1 Visit OT Evaluation $OT Eval Moderate Complexity: 1 Mod OT Treatments $Therapeutic Exercise: 8-22 mins  Nestor Lewandowsky, OTR/L Acute Rehabilitation Services Pager: [phone]  Office: [phone]  Malka So 12/30/2020, 3:14 PM

## 2020-12-30 NOTE — Progress Notes (Signed)
ANTICOAGULATION CONSULT NOTE - Follow Up Consult  Pharmacy Consult for Warfarin > heparin Indication: atrial fibrillation  Allergies  Allergen Reactions   Codeine Itching    Patient Measurements: Height: 5\' 3"  (160 cm) Weight: 56.2 kg (123 lb 14.4 oz) IBW/kg (Calculated) : 52.4  Vital Signs: Temp: 97.8 F (36.6 C) (10/26 1127) Temp Source: Oral (10/26 1127) BP: 111/68 (10/26 1530)  Labs: Recent Labs    12/28/20 0543 12/28/20 1603 12/29/20 0310 12/29/20 1152 12/29/20 1607 12/29/20 1748 12/29/20 2130 12/30/20 0307 12/30/20 1350  HGB 7.3*  --  7.1*  --   --   --   --  9.2*  --   HCT 20.0*  --  19.6*  --   --   --   --  26.5*  --   PLT 186  --  103*  --   --   --   --  87*  --   LABPROT 16.0*  --  14.5  --   --   --   --  16.1*  --   INR 1.3*  --  1.1  --   --   --   --  1.3*  --   HEPARINUNFRC  --    < > 0.25*   < >  --    < > <0.10* <0.10* <0.10*  CREATININE 3.49*  3.53*   < > 2.05*  --  1.69*  --   --  1.44*  --    < > = values in this interval not displayed.     Estimated Creatinine Clearance: 30.5 mL/min (A) (by C-G formula based on SCr of 1.44 mg/dL (H)).   Assessment: 70 year old female to begin warfarin for Afib, s/p PPM this admission.   INR increased to 6.3 after one dose of warfarin - reversed with vitamin K 5 mg. INR now down to 1.3  No s/sx of bleeding. H/h stable. Overnight increased sedation and respiratory rate possible aspiration > now on Bipap and NPO  Holding warfarin start heparin drip for now Stop heparin in CRRT and convert to systemic heparin   Heparin level this afternoon remains undetectable on heparin at 1400 units/hr. H/H improved to 9.2 after RBC transfusion yesterday. Plt down to 87k. SCr improved to 1.44.  Subtherapeutic levels likely a result of enhanced heparin clearance with CRRT. HIT neg. SRA pending   No issues with bleeding or heparin infusion per RN.     Goal of Therapy:   Heparin level 0.3-0.7 units/ml Monitor platelets  by anticoagulation protocol: Yes   Plan:  Inc heparin to 1550 units/hr F/u 6 hr HL Monitor daily HL, CBC and s/s of bleeding   Albertina Parr, PharmD., BCPS, BCCCP Clinical Pharmacist Please refer to [facility] for unit-specific pharmacist

## 2020-12-30 NOTE — Progress Notes (Signed)
Nutrition Follow-up  DOCUMENTATION CODES:  Severe malnutrition in context of chronic illness  INTERVENTION:  Initiate tube feeding via Cortrak: Vital 1.5 at 20 ml/h and increase by 10 ml/h every 8 hrs to goal rate of 50 ml/h (1200 ml per day) Prosource TF 90 ml TID   Provides 2040 kcal, 147 gm protein, 912 ml free water daily    Free water flushes per CCM.   Monitor magnesium, potassium, and phosphorus BID for at least 3 days, MD to replete as needed, as pt is at risk for refeeding syndrome.  NUTRITION DIAGNOSIS:  Severe Malnutrition related to chronic illness (CKD V) as evidenced by severe fat depletion, severe muscle depletion.  GOAL:  Patient will meet greater than or equal to 90% of their needs  MONITOR:  Diet advancement, TF tolerance, Labs, I & O's, Weight trends  REASON FOR ASSESSMENT:  Consult Enteral/tube feeding initiation and management  ASSESSMENT:  71 yo female with a PMH of who presented to [facility] 10/15 for weakness and increasing dizziness x 2 days and palpitations. 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 Admitted with acute respiratory failure with hypoxia requiring noninvasive ventilation due to volume overload and bacterial pneumonia. 10/19 - PCCM consulted in the setting of hypotension with possible sepsis, low-dose pressor requirement. ECHO with new pericardial effusion 10/20 - re consulted for recurrent hypotension req pressors. Needs HD cath placed. 10/21 chest pain feeling better. Got first HD run this morning 10/22 - on Nrb. More confused. On low dose NE.  10/23 - on NE, vaso and BiPAP after 1L volume removal iHD overnight; CRRT started 10/24 - dialysis catheter repositioned.  Patient did not receive adequate ultrafiltration overnight 10/25 - continues to have marginal  saturations off BiPAP  10/26 - weaned off BiPAP to heated high flow nasal cannula; Cortrak placed (tip gastric)  Per Nephrology, still no UOP. Remains on CRRT overnight with another 2.2 L net negative. Phos is decreasing - repletion.   Intake/Output Summary (Last 24 hours) at 12/30/2020 1225 Last data filed at 12/30/2020 1100 Gross per 24 hour  Intake 1458.64 ml  Output 3645 ml  Net -2186.36 ml   Admit wt: 56.7 kg Current wt: 56.2 kg  Medications: reviewed; SSI, midodrine TID, miralax, Senokot BID, Na Bicarb TID, Levophed  Labs: reviewed; Glucose 108 (H), Crt 1.44 (H - trending down), Phos 2.3 (L)  Diet Order:   Diet Order             Diet NPO time specified  Diet effective now                  EDUCATION NEEDS:  Education needs have been addressed  Skin:  Skin Assessment: Skin Integrity Issues: Skin Integrity Issues:: Incisions Incisions: L breast, closed and R throat, closed  Last BM:  12/16/2020 - bowel regimen in place  Height:  Ht Readings from Last 1 Encounters:  12/07/2020 5\' 3"  (1.6 m)   Weight:  Wt Readings from Last 1 Encounters:  12/30/20 56.2 kg   BMI:  Body mass index is 21.95 kg/m.  Estimated Nutritional Needs:  Kcal:  1700-1900 Protein:  125-145 grams Fluid:  >1.7 L  Derrel Nip, RD, LDN (she/her/hers) Registered Dietitian I Pager #: [phone] After-Hours/Weekend Pager # in [place]

## 2020-12-30 NOTE — Procedures (Signed)
Cortrak  Person Inserting Tube:  Rishabh Rinkenberger E, RD Tube Type:  Cortrak - 43 inches Tube Size:  10 Tube Location:  Left nare Initial Placement:  Stomach Secured by: Bridle Technique Used to Measure Tube Placement:  Marking at nare/corner of mouth Cortrak Secured At:  65 cm   Cortrak Tube Team Note:  Consult received to place a Cortrak feeding tube.   X-ray is required, abdominal x-ray has been ordered by the Cortrak team. Please confirm tube placement before using the Cortrak tube.   If the tube becomes dislodged please keep the tube and contact the Cortrak team at [email or web address] ([email or web address]) for replacement.  If after hours and replacement cannot be delayed, place a NG tube and confirm placement with an abdominal x-ray.    Darren Caldron, MS, RD, LDN (she/her/hers) RD pager number and weekend/on-call pager number located in Amion.    

## 2020-12-30 NOTE — Progress Notes (Addendum)
Progress Note  Patient Name: Debra Petersen Date of Encounter: 12/30/2020  [facility] Cardiologist: Dr. Caryl Comes  Subjective   Off BIPAP, alert, but confused  Inpatient Medications    Scheduled Meds:  chlorhexidine  15 mL Mouth Rinse BID   Chlorhexidine Gluconate Cloth  6 each Topical Daily   [START ON 01/03/2021] darbepoetin (ARANESP) injection - NON-DIALYSIS  200 mcg Subcutaneous Q Sun-1800   insulin aspart  1-3 Units Subcutaneous Q4H   mouth rinse  15 mL Mouth Rinse q12n4p   sodium chloride flush  10-40 mL Intracatheter Q12H   Continuous Infusions:   prismasol BGK 4/2.5 400 mL/hr at 12/30/20 0007    prismasol BGK 4/2.5 200 mL/hr at 12/30/20 3846   sodium chloride     sodium chloride     sodium chloride     amiodarone 30 mg/hr (12/30/20 1000)   ceFEPime (MAXIPIME) IV Stopped (12/29/20 2239)   heparin 1,400 Units/hr (12/30/20 1000)   norepinephrine (LEVOPHED) Adult infusion 10 mcg/min (12/30/20 1000)   prismasol BGK 4/2.5 1,000 mL/hr at 12/30/20 0011   sodium phosphate  Dextrose 5% IVPB     vancomycin Stopped (12/29/20 1309)   vasopressin Stopped (12/30/20 0445)   PRN Meds: sodium chloride, sodium chloride, food thickener, heparin, HYDROmorphone (DILAUDID) injection, melatonin, [DISCONTINUED] ondansetron **OR** ondansetron (ZOFRAN) IV, oxyCODONE-acetaminophen, sodium chloride   Vital Signs    Vitals:   12/30/20 0900 12/30/20 0915 12/30/20 0930 12/30/20 1000  BP: (!) 82/51 93/72 (!) 115/54 92/61  Pulse:      Resp: 17 17 (!) 24 17  Temp:      TempSrc:      SpO2: 97% 100% 98% (!) 39%  Weight:      Height:        Intake/Output Summary (Last 24 hours) at 12/30/2020 1020 Last data filed at 12/30/2020 1000 Gross per 24 hour  Intake 1564 ml  Output 3659 ml  Net -2095 ml   Last 3 Weights 12/30/2020 12/29/2020 12/28/2020  Weight (lbs) 123 lb 14.4 oz 128 lb 12 oz 149 lb 11.1 oz  Weight (kg) 56.2 kg 58.4 kg 67.9 kg      Telemetry    AFib, rates 8100-120s,  infrequent V paced beat - Personally Reviewed  ECG    No new EKGs - Personally Reviewed  Physical Exam   GEN: looks older then her age, chronically ill in appearance, comfortable on BIPAP Neck: No JVD Cardiac: irreg-irreg, no murmurs, rubs, or gallops, warm extremeties Respiratory: diminished at the bases, scattered rales GI: Soft, nontender, non-distended  MS: No edema; No deformity, advanced atrophy for age Neuro:  alert but confused Psych: Normal affect  Pacer site: dressing is clean and dry, no hematoma  Labs    High Sensitivity Troponin:   Recent Labs  Lab 12/18/2020 1626 12/22/2020 1940 01/03/2021 0439 12/17/2020 0812  TROPONINIHS 1,843* 1,658* 1,457* 1,294*     Chemistry Recent Labs  Lab 12/24/20 0354 12/24/20 1737 12/28/20 0543 12/28/20 1603 12/29/20 0310 12/29/20 1607 12/30/20 0307  NA 125*   < > 134*  133*   < > 133* 134* 135  K 4.5   < > 4.5  4.5   < > 4.3 4.5 4.3  CL 87*   < > 99  99   < > 101 101 102  CO2 22   < > 24  23   < > 25 24 25   GLUCOSE 283*   < > 122*  123*   < > 120* 109* 108*  BUN 40*   < > 33*  34*   < > 21 19 14   CREATININE 6.60*   < > 3.49*  3.53*   < > 2.05* 1.69* 1.44*  CALCIUM 8.4*   < > 8.8*  8.8*   < > 8.6* 9.0 9.2  MG 2.3   < > 2.5*  --  2.5*  --  2.4  PROT 5.4*  --   --   --   --   --   --   ALBUMIN 2.1*  --  1.6*   < > 1.6* 1.8* 1.7*  AST 15  --   --   --   --   --   --   ALT 5  --   --   --   --   --   --   ALKPHOS 165*  --   --   --   --   --   --   BILITOT 0.3  --   --   --   --   --   --   GFRNONAA 6*   < > 14*  13*   < > 26* 32* 39*  ANIONGAP 16*   < > 11  11   < > 7 9 8    < > = values in this interval not displayed.    Lipids No results for input(s): CHOL, TRIG, HDL, LABVLDL, LDLCALC, CHOLHDL in the last 168 hours.  Hematology Recent Labs  Lab 12/28/20 0543 12/29/20 0310 12/30/20 0307  WBC 17.5* 21.1* 24.1*  RBC 2.10* 2.05* 2.86*  HGB 7.3* 7.1* 9.2*  HCT 20.0* 19.6* 26.5*  MCV 95.2 95.6 92.7  MCH 34.8*  34.6* 32.2  MCHC 36.5* 36.2* 34.7  RDW 15.5 15.9* 16.2*  PLT 186 103* 87*   Thyroid No results for input(s): TSH, FREET4 in the last 168 hours.  BNP Recent Labs  Lab 12/24/20 0354 12/27/20 0418  BNP 1,160.9* 1,561.2*    DDimer No results for input(s): DDIMER in the last 168 hours.   Radiology    No results found.  Cardiac Studies    12/24/20: TTE (limited) IMPRESSIONS   1. Left ventricular ejection fraction, by estimation, is 65 to 70%. The  left ventricle has hyperdynamic function. The left ventricle has no  regional wall motion abnormalities.   2. There is a small circumferential pericardial effusion. 20% mitral E  inflow velocity respirophasic variation (likely not significant). IVC is  dilated but collapses normally with inspiration. There is no RV diastolic  collapse. This study is not  suggestive of tamponade.   3. The aortic valve is tricuspid.   4. The inferior vena cava is dilated in size with >50% respiratory  variability, suggesting right atrial pressure of 8 mmHg.   5. Limited echo with no color doppler.   6. The patient was in atrial fibrillation.    12/23/20: TTE (limited) IMPRESSIONS   1. Left ventricular ejection fraction, by estimation, is 60 to 65%. The  left ventricle has normal function. The left ventricle has no regional  wall motion abnormalities.   2. Right ventricular systolic function is hyperdynamic. The right  ventricular size is normal.   3. There is a small circumferential pericardial effusion is present.  There is lack of full expansion of the right ventricle; this could be an  early sign of increased pericardial pressure. Hyperdynamic septum with no  evidence of ventricular  interdependence.   4. The mitral valve is normal in structure. No  evidence of mitral valve  regurgitation. No evidence of mitral stenosis.   5. The aortic valve is normal in structure. Aortic valve regurgitation is  not visualized. No aortic stenosis is present.    6. The inferior vena cava is normal in size with greater than 50%  respiratory variability, suggesting right atrial pressure of 3 mmHg.  Conclusion(s)/Recommendation(s): Due to findings of early increased  pericardial pressure, a follow up echocardiogram is recommended in  tomorrow.    12/16/2020: TTE IMPRESSIONS   1. Left ventricular ejection fraction, by estimation, is 65 to 70%. The  left ventricle has normal function. The left ventricle has no regional  wall motion abnormalities. Left ventricular diastolic parameters are  consistent with Grade I diastolic  dysfunction (impaired relaxation).   2. Right ventricular systolic function is normal. The right ventricular  size is normal. There is normal pulmonary artery systolic pressure. The  estimated right ventricular systolic pressure is 75.1 mmHg.   3. The mitral valve is normal in structure. Mild mitral valve  regurgitation. No evidence of mitral stenosis.   4. The aortic valve is normal in structure. Aortic valve regurgitation is  not visualized. No aortic stenosis is present.   5. The inferior vena cava is dilated in size with >50% respiratory  variability, suggesting right atrial pressure of 8 mmHg.   Patient Profile     70 y.o. female w/PMHx  of AT, HTN, CKD (V) IV, AFlutter (ablated), paroxysmal Afib, admitted with weakness and near syncope, found with pauses/dx with tachy-brady.  Had temp wire placed initially 12/14/2020 PPM implanted 12/13/2020 Post procedure CP > echo 12/23/20 with small pericardial effusion no tamponade mentioned incomplete RV emptying CP resolved Repeat echo 12/24/20 with stable small pericardial effusion, no tamponade  Assessment & Plan    Weakness, near syncope Pauses Paroxysmal Afib Tachy brady Temp pacing wire initially PPM 12/16/2020, POD #9 POD #1 device check with stable measurements POD #1 CXR without PTx Small stable pericardial effusion without tamponade by 2 follow up echos last  week This is not contributing to her overall clinical picture  HRs not perfect, 100's-120s Amiodarone gtt resumed with difficult to take PO/BIPAP tx Heparin gtt  >> probably coumadin once able to take PO/per tube   She has had hypotension and worsening renal function since her admission Worsening SOB > still on BIPAP confused Remains on low dose levo, off vasopressin Started on CRRT with good volume being removed  Nutrition as per CCM planned for [employer] today with plans for midodrine and try to wean Levo and nutrtion  Continue care with nephrology and CCM  WBC rising   Dr. Lovena Le has seen and examinedthe patient this morning Not much to add again from EP perspective No new recommendations today > to PO amiodarone once off pressors and clinic status allows, suspect/hope her HR will improve once off levo   For questions or updates, please contact [facility] Please consult [email or web address] for contact info under      Signed, Baldwin Jamaica, PA-C  12/30/2020, 10:20 AM    EP Attending  Patient seen and examined. Agree with above. She remains ill with no renal output but improved oxygenation. She will be transitioned to oral amio and hopefully can be discontinued from IV pressors. She remains with rapid atrial fib. Hopefully this will improve. I am concerned about her mentation today.   Carleene Overlie Arnez Stoneking,MD

## 2020-12-31 ENCOUNTER — Inpatient Hospital Stay (HOSPITAL_COMMUNITY): Payer: Medicare Other

## 2020-12-31 ENCOUNTER — Ambulatory Visit: Payer: Medicare Other

## 2020-12-31 DIAGNOSIS — I4891 Unspecified atrial fibrillation: Secondary | ICD-10-CM | POA: Diagnosis not present

## 2020-12-31 DIAGNOSIS — J9601 Acute respiratory failure with hypoxia: Secondary | ICD-10-CM | POA: Diagnosis not present

## 2020-12-31 LAB — CBC
HCT: 27.9 % — ABNORMAL LOW (ref 36.0–46.0)
Hemoglobin: 10 g/dL — ABNORMAL LOW (ref 12.0–15.0)
MCH: 33.2 pg (ref 26.0–34.0)
MCHC: 35.8 g/dL (ref 30.0–36.0)
MCV: 92.7 fL (ref 80.0–100.0)
Platelets: 68 10*3/uL — ABNORMAL LOW (ref 150–400)
RBC: 3.01 MIL/uL — ABNORMAL LOW (ref 3.87–5.11)
RDW: 17.4 % — ABNORMAL HIGH (ref 11.5–15.5)
WBC: 27.5 10*3/uL — ABNORMAL HIGH (ref 4.0–10.5)
nRBC: 35.8 % — ABNORMAL HIGH (ref 0.0–0.2)

## 2020-12-31 LAB — GLUCOSE, CAPILLARY
Glucose-Capillary: 165 mg/dL — ABNORMAL HIGH (ref 70–99)
Glucose-Capillary: 146 mg/dL — ABNORMAL HIGH (ref 70–99)
Glucose-Capillary: 124 mg/dL — ABNORMAL HIGH (ref 70–99)
Glucose-Capillary: 131 mg/dL — ABNORMAL HIGH (ref 70–99)
Glucose-Capillary: 100 mg/dL — ABNORMAL HIGH (ref 70–99)

## 2020-12-31 LAB — RENAL FUNCTION PANEL
Albumin: 2 g/dL — ABNORMAL LOW (ref 3.5–5.0)
Anion gap: 8 (ref 5–15)
BUN: 17 mg/dL (ref 8–23)
CO2: 26 mmol/L (ref 22–32)
Calcium: 9.3 mg/dL (ref 8.9–10.3)
Chloride: 102 mmol/L (ref 98–111)
Creatinine, Ser: 1.52 mg/dL — ABNORMAL HIGH (ref 0.44–1.00)
GFR, Estimated: 37 mL/min — ABNORMAL LOW (ref >60)
Glucose, Bld: 157 mg/dL — ABNORMAL HIGH (ref 70–99)
Phosphorus: 1.7 mg/dL — ABNORMAL LOW (ref 2.5–4.6)
Potassium: 4.1 mmol/L (ref 3.5–5.1)
Sodium: 136 mmol/L (ref 135–145)

## 2020-12-31 LAB — PROTIME-INR
INR: 1.5 — ABNORMAL HIGH (ref 0.8–1.2)
Prothrombin Time: 18.4 seconds — ABNORMAL HIGH (ref 11.4–15.2)

## 2020-12-31 LAB — MAGNESIUM
Magnesium: 2.6 mg/dL — ABNORMAL HIGH (ref 1.7–2.4)
Magnesium: 2.2 mg/dL (ref 1.7–2.4)

## 2020-12-31 LAB — HEPARIN LEVEL (UNFRACTIONATED)
Heparin Unfractionated: 0.53 IU/mL (ref 0.30–0.70)
Heparin Unfractionated: 0.97 IU/mL — ABNORMAL HIGH (ref 0.30–0.70)
Heparin Unfractionated: >1.1 IU/mL — ABNORMAL HIGH (ref 0.30–0.70)
Heparin Unfractionated: >1.1 IU/mL — ABNORMAL HIGH (ref 0.30–0.70)
Heparin Unfractionated: >1.1 IU/mL — ABNORMAL HIGH (ref 0.30–0.70)

## 2020-12-31 LAB — PHOSPHORUS: Phosphorus: 6.4 mg/dL — ABNORMAL HIGH (ref 2.5–4.6)

## 2020-12-31 MED ORDER — DIGOXIN 125 MCG PO TABS: 0.0625 mg | ORAL_TABLET | ORAL | Status: DC

## 2020-12-31 MED ORDER — MIDODRINE HCL 5 MG PO TABS: 10.0000 mg | ORAL_TABLET | Freq: Three times a day (TID) | ORAL | Status: DC

## 2020-12-31 MED ORDER — SODIUM CHLORIDE 0.9 % IV SOLN
125.0000 mg | Freq: Every day | INTRAVENOUS | Status: DC
  Administered 2020-12-31: 125 mg via INTRAVENOUS
  Filled 2020-12-31 (×2): qty 10

## 2020-12-31 MED ORDER — DIGOXIN 0.25 MG/ML IJ SOLN
0.2500 mg | Freq: Every day | INTRAMUSCULAR | Status: AC
  Administered 2020-12-31: 0.25 mg via INTRAVENOUS
  Filled 2020-12-31: qty 2

## 2020-12-31 MED ORDER — AMIODARONE HCL 200 MG PO TABS
200.0000 mg | ORAL_TABLET | Freq: Two times a day (BID) | ORAL | Status: DC
  Administered 2020-12-31 (×2): 200 mg via ORAL
  Filled 2020-12-31 (×2): qty 1

## 2020-12-31 MED ORDER — AMIODARONE HCL 200 MG PO TABS: 200.0000 mg | ORAL_TABLET | Freq: Every day | ORAL | Status: DC

## 2020-12-31 MED ORDER — SENNOSIDES-DOCUSATE SODIUM 8.6-50 MG PO TABS
2.0000 | ORAL_TABLET | Freq: Two times a day (BID) | ORAL | Status: DC
  Administered 2020-12-31 (×2): 2 via ORAL
  Filled 2020-12-31 (×2): qty 2

## 2020-12-31 MED ORDER — SODIUM PHOSPHATES 45 MMOLE/15ML IV SOLN
30.0000 mmol | Freq: Once | INTRAVENOUS | Status: AC
  Administered 2020-12-31: 30 mmol via INTRAVENOUS
  Filled 2020-12-31: qty 10

## 2020-12-31 MED ORDER — DIGOXIN 125 MCG PO TABS: 0.1250 mg | ORAL_TABLET | ORAL | Status: DC

## 2020-12-31 MED ORDER — ALBUMIN HUMAN 25 % IV SOLN: 12.5000 g | Freq: Once | INTRAVENOUS | Status: DC

## 2020-12-31 MED ORDER — HEPARIN (PORCINE) 25000 UT/250ML-% IV SOLN
1350.0000 [IU]/h | INTRAVENOUS | Status: DC
  Administered 2020-12-31: 1350 [IU]/h via INTRAVENOUS
  Filled 2020-12-31 (×2): qty 250

## 2020-12-31 MED ORDER — DIGOXIN 0.25 MG/ML IJ SOLN: 0.1250 mg | Freq: Once | INTRAMUSCULAR | Status: DC

## 2020-12-31 NOTE — Progress Notes (Addendum)
Progress Note  Patient Name: Debra Petersen Date of Encounter: 12/31/2020  [facility] Cardiologist: Dr. Caryl Comes  Subjective   Alert, remains confused, though better with son at bedside today  Inpatient Medications    Scheduled Meds:  amiodarone  200 mg Oral BID   Followed by   Derrill Memo ON 01/07/2021] amiodarone  200 mg Oral Daily   chlorhexidine  15 mL Mouth Rinse BID   Chlorhexidine Gluconate Cloth  6 each Topical Daily   [START ON 01/03/2021] darbepoetin (ARANESP) injection - NON-DIALYSIS  200 mcg Subcutaneous Q Sun-1800   digoxin  0.125 mg Oral Daily   feeding supplement (PROSource TF)  90 mL Per Tube TID   insulin aspart  1-3 Units Subcutaneous Q4H   mouth rinse  15 mL Mouth Rinse q12n4p   midodrine  10 mg Oral TID WC   midodrine  10 mg Per Tube TID WC   polyethylene glycol  17 g Oral BID   senna-docusate  1 tablet Oral BID   sodium chloride flush  10-40 mL Intracatheter Q12H   Continuous Infusions:   prismasol BGK 4/2.5 400 mL/hr at 12/31/20 0239    prismasol BGK 4/2.5 200 mL/hr at 12/31/20 6967   sodium chloride     sodium chloride     sodium chloride     albumin human     feeding supplement (VITAL 1.5 CAL) 1,000 mL (12/30/20 1509)   ferric gluconate (FERRLECIT) IVPB     heparin 1,550 Units/hr (12/31/20 0800)   norepinephrine (LEVOPHED) Adult infusion 17 mcg/min (12/31/20 0800)   prismasol BGK 4/2.5 1,000 mL/hr at 12/31/20 0731   sodium phosphate  Dextrose 5% IVPB     PRN Meds: sodium chloride, sodium chloride, acetaminophen **OR** acetaminophen, food thickener, heparin, HYDROmorphone (DILAUDID) injection, melatonin, [DISCONTINUED] ondansetron **OR** ondansetron (ZOFRAN) IV, oxyCODONE-acetaminophen, sodium chloride, traMADol   Vital Signs    Vitals:   12/31/20 0700 12/31/20 0800 12/31/20 0803 12/31/20 0811  BP: 105/63 (!) 86/53 (!) 86/53   Pulse:   (!) 128   Resp: 17 20 (!) 22   Temp:    99.1 F (37.3 C)  TempSrc:    Oral  SpO2: 99% 94% 96%   Weight:       Height:        Intake/Output Summary (Last 24 hours) at 12/31/2020 0915 Last data filed at 12/31/2020 0800 Gross per 24 hour  Intake 1929.33 ml  Output 3177 ml  Net -1247.67 ml   Last 3 Weights 12/31/2020 12/30/2020 12/29/2020  Weight (lbs) 113 lb 1.5 oz 123 lb 14.4 oz 128 lb 12 oz  Weight (kg) 51.3 kg 56.2 kg 58.4 kg      Telemetry    AFib, rates 100-140s, infrequent V paced beat - Personally Reviewed  ECG    No new EKGs - Personally Reviewed  Physical Exam   GEN: looks older then her age, chronically ill in appearance, comfortable on BIPAP Neck: No JVD Cardiac: irreg-irreg, tachycardic, no murmurs, rubs, or gallops, warm extremeties Respiratory: diminished at the bases, scattered rales GI: Soft, nontender, non-distended  MS: No edema; No deformity, advanced atrophy for age Neuro:  alert but confused, answers simple pleasantries correctly, follows commands Psych: Normal affect  Pacer site: dressing and steri strips are removed without difficulty, wound edges are well approximated, no erythema, edema, fluctuation.  Is well healed without signs of infection  Labs    High Sensitivity Troponin:   Recent Labs  Lab 12/17/2020 1626 12/17/2020 1940 12/31/2020 0439 12/16/2020 8938  TROPONINIHS 1,843* 1,658* 1,457* 1,294*     Chemistry Recent Labs  Lab 12/30/20 0307 12/30/20 1235 12/30/20 1824 12/31/20 0218  NA 135  --  135 136  K 4.3  --  3.6 4.1  CL 102  --  102 102  CO2 25  --  26 26  GLUCOSE 108*  --  133* 157*  BUN 14  --  14 17  CREATININE 1.44*  --  1.49* 1.52*  CALCIUM 9.2  --  8.8* 9.3  MG 2.4 DUPLICATE REQUEST  2.5* 2.4 2.6*  ALBUMIN 1.7*  --  1.7* 2.0*  GFRNONAA 39*  --  38* 37*  ANIONGAP 8  --  7 8    Lipids No results for input(s): CHOL, TRIG, HDL, LABVLDL, LDLCALC, CHOLHDL in the last 168 hours.  Hematology Recent Labs  Lab 12/29/20 0310 12/30/20 0307 12/31/20 0218  WBC 21.1* 24.1* 27.5*  RBC 2.05* 2.86* 3.01*  HGB 7.1* 9.2* 10.0*  HCT  19.6* 26.5* 27.9*  MCV 95.6 92.7 92.7  MCH 34.6* 32.2 33.2  MCHC 36.2* 34.7 35.8  RDW 15.9* 16.2* 17.4*  PLT 103* 87* 68*   Thyroid No results for input(s): TSH, FREET4 in the last 168 hours.  BNP Recent Labs  Lab 12/27/20 0418  BNP 1,561.2*    DDimer No results for input(s): DDIMER in the last 168 hours.   Radiology    DG Abd Portable 1V  Result Date: 12/30/2020 CLINICAL DATA:  Feeding tube placement. EXAM: PORTABLE ABDOMEN - 1 VIEW COMPARISON:  CT abdomen pelvis 10/03/2019 FINDINGS: Enteric tube tip projects just past the midline in the right abdomen, at the expected location of the gastric antrum. Nonobstructive bowel gas pattern. Surgical staples overlie the lower pelvis. Cholecystectomy clips. Soft tissue calcifications overlie the bilateral pelvis. Tubing overlying the right pelvis is likely external to the patient. IMPRESSION: Enteric tube tip projects at the expected location of the gastric antrum. Nonobstructive bowel gas pattern. Electronically Signed   By: Ileana Roup M.D.   On: 12/30/2020 11:33    Cardiac Studies    12/24/20: TTE (limited) IMPRESSIONS   1. Left ventricular ejection fraction, by estimation, is 65 to 70%. The  left ventricle has hyperdynamic function. The left ventricle has no  regional wall motion abnormalities.   2. There is a small circumferential pericardial effusion. 20% mitral E  inflow velocity respirophasic variation (likely not significant). IVC is  dilated but collapses normally with inspiration. There is no RV diastolic  collapse. This study is not  suggestive of tamponade.   3. The aortic valve is tricuspid.   4. The inferior vena cava is dilated in size with >50% respiratory  variability, suggesting right atrial pressure of 8 mmHg.   5. Limited echo with no color doppler.   6. The patient was in atrial fibrillation.    12/23/20: TTE (limited) IMPRESSIONS   1. Left ventricular ejection fraction, by estimation, is 60 to 65%. The   left ventricle has normal function. The left ventricle has no regional  wall motion abnormalities.   2. Right ventricular systolic function is hyperdynamic. The right  ventricular size is normal.   3. There is a small circumferential pericardial effusion is present.  There is lack of full expansion of the right ventricle; this could be an  early sign of increased pericardial pressure. Hyperdynamic septum with no  evidence of ventricular  interdependence.   4. The mitral valve is normal in structure. No evidence of mitral valve  regurgitation.  No evidence of mitral stenosis.   5. The aortic valve is normal in structure. Aortic valve regurgitation is  not visualized. No aortic stenosis is present.   6. The inferior vena cava is normal in size with greater than 50%  respiratory variability, suggesting right atrial pressure of 3 mmHg.  Conclusion(s)/Recommendation(s): Due to findings of early increased  pericardial pressure, a follow up echocardiogram is recommended in  tomorrow.    12/11/2020: TTE IMPRESSIONS   1. Left ventricular ejection fraction, by estimation, is 65 to 70%. The  left ventricle has normal function. The left ventricle has no regional  wall motion abnormalities. Left ventricular diastolic parameters are  consistent with Grade I diastolic  dysfunction (impaired relaxation).   2. Right ventricular systolic function is normal. The right ventricular  size is normal. There is normal pulmonary artery systolic pressure. The  estimated right ventricular systolic pressure is 58.5 mmHg.   3. The mitral valve is normal in structure. Mild mitral valve  regurgitation. No evidence of mitral stenosis.   4. The aortic valve is normal in structure. Aortic valve regurgitation is  not visualized. No aortic stenosis is present.   5. The inferior vena cava is dilated in size with >50% respiratory  variability, suggesting right atrial pressure of 8 mmHg.   Patient Profile     70 y.o.  female w/PMHx  of AT, HTN, CKD (V) IV, AFlutter (ablated), paroxysmal Afib, admitted with weakness and near syncope, found with pauses/dx with tachy-brady.  Had temp wire placed initially 12/18/2020 PPM implanted 01/04/2021 Post procedure CP > echo 12/23/20 with small pericardial effusion no tamponade mentioned incomplete RV emptying CP resolved Repeat echo 12/24/20 with stable small pericardial effusion, no tamponade  Assessment & Plan    Weakness, near syncope Pauses Paroxysmal Afib Tachy brady Temp pacing wire initially PPM 12/14/2020, POD #10 POD #1 device check with stable measurements POD #1 CXR without PTx Small stable pericardial effusion without tamponade by 2 follow up echos last week This is not contributing to her overall clinical picture  Site is well healed Device check today with stable measurements   --- HRs not perfect, 100's-130s Amiodarone gtt >> PO today via CCM CCM added dig today, I will discuss with Dr. Lovena Le, not ideal for her, but not sure options still on pressor support  ---- Heparin gtt  >> probably coumadin once able to take PO/per tube, likely plans for tunneled HD catheter, will again defer switch timing to CCM/nephrology once no further procedures planned   She has had hypotension and worsening renal function since her admission ---- Worsening SOB > BIPAP >> HF at 20L Confused but alert ---- Remains on levo though increasing dose, off vasopressin Started on CRRT with good volume being removed though last night BP requirements increased with increasing levo and CRRT has been held   Continue care with nephrology and CCM  WBC continues to rise Platelets continue to decline, she was HIT negative  H/H stable, deferred to CCM    For questions or updates, please contact [place] Please consult [email or web address] for contact info under      Signed, Baldwin Jamaica, PA-C  12/31/2020, 9:15 AM    EP attending  Patient seen and examined.  Agree  with the findings as noted above.  She continues with a combination of renal failure and atrial fibrillation with a rapid ventricular response despite several days of intravenous amiodarone therapy.  Her blood pressure is borderline.  She is essentially making no  urine.  She has an elevated white blood cell count.  She will be maintained on pressure support with positive inotropic agents.  We will add digoxin.  She will continue oral amiodarone therapy.  Her intravenous amiodarone has been discontinued.  She is awake and alert and her exam demonstrates a frail elderly appearing woman in no distress the lungs reveal scattered rales.  Cardiovascular exam reveals irregular irregular rhythm with tachycardia.  Assessment and plan 1.  Atrial fibrillation with rapid ventricular response -she will be started on digoxin.  We will watch her for evidence of digoxin toxicity.  She will continue amiodarone. 2.  Acute on chronic diastolic heart failure -she has been hypotensive.  She appears to be somewhat overly diuresed with her CRRT.  Hemodialysis will need to be considered but concern is for her poor hemodynamic status.  Cristopher Peru, MD

## 2020-12-31 NOTE — Plan of Care (Signed)

## 2020-12-31 NOTE — Progress Notes (Signed)
NAME:  Debra Petersen, MRN:  [id number], DOB:  1950-07-05, LOS: 12 ADMISSION DATE:  12/09/2020 CONSULTATION DATE:  12/23/2020 REFERRING MD:  Tyrell Antonio - [facility] CHIEF COMPLAINT: Hypotension, possible sepsis   History of Present Illness:  70 year old woman who presented to [facility] 10/15 for weakness and increasing dizziness x 2 days and palpitations. 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  In ED, patient was tachycardic to 130s with telemetry demonstrating Afib with RVR. Cardizem gtt was started at slow rate, given patient report of mild bradycardia at home rates 40s-60s). Heparin gtt was initiated for [facility]. Troponin was elevated to 1843, presumed secondary to demand ischemia. Patient was admitted to [facility] with Cardiology/Nephrology consults.   After 4-5 second pause noted on tele 10/15 with variable HR (50s to 130s), concern raised for sick sinus syndrome and EP was consulted, given complex management of antiarrhythmics in the setting of CKD. Underwent temp pacer placement 10/16 with subsequent permanent pacemaker placement 10/17.  On 10/18PM, patient had brief period of hypotension prompting peripheral low-dose pressor initiation. Additionally, Cr worsened with concern for ARF superimposed on CKD. PCCM was consulted for hypotension/ARF with concern for sepsis.  Pertinent Medical History:   Past Medical History:  Diagnosis Date   CKD (chronic kidney disease)    Lupus (systemic lupus erythematosus) (HCC)    Wide-complex tachycardia    probable atrial flutter with 1-1 and 2-1 conduction   Significant Hospital Events: Including procedures, antibiotic start and stop dates in addition to other pertinent events   10/19 - PCCM consulted in the setting of hypotension with possible sepsis, low-dose pressor requirement. ECHO with  new pericardial effusion 10/20 - re consulted for recurrent hypotension req pressors. C/o chest pain. Needs HD cath placed. Broadening abx   10/21 chest pain feeling better. Got first HD run this morning 10/22 on Nrb. More confused. On low dose NE.  10/23 on NE, vaso and BiPAP after 1L volume removal iHD overnight  10/24 dialysis catheter repositioned.  Patient did not receive adequate ultrafiltration overnight 10/25 continues to have marginal saturations off BiPAP 10/26 weaned off BiPAP to heated high flow nasal cannula.  Interim History / Subjective:  More awake and interactive today. Increased vasopressor requirements overnight. Now running CRRT even.   Objective:  Blood pressure (!) 86/53, pulse (!) 128, temperature 99.1 F (37.3 C), temperature source Oral, resp. rate (!) 22, height 5\' 3"  (1.6 m), weight 51.3 kg, SpO2 96 %.    FiO2 (%):  [65 %-80 %] 65 %   Intake/Output Summary (Last 24 hours) at 12/31/2020 0848 Last data filed at 12/31/2020 0800 Gross per 24 hour  Intake 1919.34 ml  Output 3339 ml  Net -1419.66 ml    Filed Weights   12/29/20 0500 12/30/20 0500 12/31/20 0400  Weight: 58.4 kg 56.2 kg 51.3 kg   Physical Examination: General: Frail.  In no distress Neuro: more awake and interactive. Strength improving.  HEENT: on HHFNC Sclera are pale. Dry oral mucosa CV: irir s1s1 no rgm  PULM: Fine crackles throughout both lungs. GI: soft ndnt + bowel sounds  Extremities: Edema resolved.  Markedly decreased muscle mass.  Skin: pale c/d/w. Scattered bruising    Resolved Hospital Problem List:    Assessment & Plan:   Critically ill due to acute respiratory failure with hypoxia requiring noninvasive ventilation due to volume overload and  bacterial pneumonia Critically ill due to distributive shock presumably secondary to bacterial pneumonia Critically ill due to atrial fibrillation with rapid ventricular response requiring titration of amiodarone Critically ill due  to acute kidney injury superimposed on stage V kidney disease requiring CRRT Anticoagulation related coagulopathy Pericardial effusion without tamponade Dysphagia Cachexia with severe protein calorie malnutrition.    Appears euvolemic now. Hypoxia due to resolving pneumonia and atelectasis.   Plan:  -Hold CRRT for now. Nephrology to decide regarding IHD/Permcath placement.  - Albumin challenge - appears hypovolemic.  -Wean vasopressors once midodrine started enterally. -Serial echo shows only small pericardial effusion and normal LV function. -Nasal cannula -Transition to amiodarone PO and added digoxin for rate control.  -IV heparin systemically for stroke prevention. -Ongoing speech therapy -Cortrak for enteral nutrition.   Best Practice: (right click and "Reselect all SmartList Selections" daily)   Diet-nectar thick  / NPO on BiPAP Code Status: Full  VTE ppx: SCD GI ppx: n/a  Goals of care: 10/24 pt and son at bedside  Lines- temp HD cath RIJ   Labs:  CBC: Recent Labs  Lab 12/27/20 0418 12/28/20 0543 12/29/20 0310 12/30/20 0307 12/31/20 0218  WBC 19.0* 17.5* 21.1* 24.1* 27.5*  HGB 8.2* 7.3* 7.1* 9.2* 10.0*  HCT 22.8* 20.0* 19.6* 26.5* 27.9*  MCV 97.0 95.2 95.6 92.7 92.7  PLT 212 186 103* 87* 68*    Basic Metabolic Panel: Recent Labs  Lab 12/29/20 0310 12/29/20 1607 12/30/20 0307 12/30/20 1235 12/30/20 1824 12/31/20 0218  NA 133* 134* 135  --  135 136  K 4.3 4.5 4.3  --  3.6 4.1  CL 101 101 102  --  102 102  CO2 25 24 25   --  26 26  GLUCOSE 120* 109* 108*  --  133* 157*  BUN 21 19 14   --  14 17  CREATININE 2.05* 1.69* 1.44*  --  1.49* 1.52*  CALCIUM 8.6* 9.0 9.2  --  8.8* 9.3  MG 2.5*  --  2.4 DUPLICATE REQUEST  2.5* 2.4 2.6*  PHOS 2.9 3.1 2.3*  2.3* 1.9* 1.6* 1.7*    GFR: Estimated Creatinine Clearance: 28.3 mL/min (A) (by C-G formula based on SCr of 1.52 mg/dL (H)). Recent Labs  Lab 12/25/20 0349 12/26/20 0348 12/28/20 0543  12/29/20 0310 12/30/20 0307 12/31/20 0218  PROCALCITON 17.19  --   --   --   --   --   WBC 17.2*   < > 17.5* 21.1* 24.1* 27.5*   < > = values in this interval not displayed.    Liver Function Tests: Recent Labs  Lab 12/29/20 0310 12/29/20 1607 12/30/20 0307 12/30/20 1824 12/31/20 0218  ALBUMIN 1.6* 1.8* 1.7* 1.7* 2.0*    No results for input(s): LIPASE, AMYLASE in the last 168 hours. No results for input(s): AMMONIA in the last 168 hours.  ABG:    Component Value Date/Time   PHART 7.492 (H) 12/24/2020 1737   PCO2ART 27.7 (L) 12/24/2020 1737   PO2ART 124 (H) 12/24/2020 1737   HCO3 21.2 12/24/2020 1737   TCO2 22 12/24/2020 1737   ACIDBASEDEF 2.0 12/24/2020 1737   O2SAT 99.0 12/24/2020 1737     Coagulation Profile: Recent Labs  Lab 12/27/20 0418 12/28/20 0543 12/29/20 0310 12/30/20 0307 12/31/20 0218  INR 1.4* 1.3* 1.1 1.3* 1.5*     Cardiac Enzymes: No results for input(s): CKTOTAL, CKMB, CKMBINDEX, TROPONINI in the last 168 hours.  HbA1C: Hgb A1c MFr Bld  Date/Time Value Ref Range Status  12/24/2020 04:46 PM 5.0 4.8 - 5.6 % Final    Comment:    (NOTE) Pre diabetes:          5.7%-6.4%  Diabetes:              >6.4%  Glycemic control for   <7.0% adults with diabetes   12/14/2020 01:30 AM 4.9 4.8 - 5.6 % Final    Comment:    (NOTE) Pre diabetes:          5.7%-6.4%  Diabetes:              >6.4%  Glycemic control for   <7.0% adults with diabetes    CBG: Recent Labs  Lab 12/30/20 1640 12/30/20 1923 12/30/20 2328 12/31/20 0342 12/31/20 0809  GLUCAP 104* 119* 147* 165* 146*     CRITICAL CARE Performed by: Kipp Brood   Total critical care time: 40 minutes  Critical care time was exclusive of separately billable procedures and treating other patients. Critical care was necessary to treat or prevent imminent or life-threatening deterioration.  Critical care was time spent personally by me on the following activities: development of  treatment plan with patient and/or surrogate as well as nursing, discussions with consultants, evaluation of patient's response to treatment, examination of patient, obtaining history from patient or surrogate, ordering and performing treatments and interventions, ordering and review of laboratory studies, ordering and review of radiographic studies, pulse oximetry and re-evaluation of patient's condition.  Kipp Brood, MD [facility] ICU Physician [place]  Pager: [phone] Or Epic Secure Chat After hours: [phone].  12/31/2020, 8:48 AM     12/31/2020, 8:48 AM

## 2020-12-31 NOTE — Progress Notes (Signed)
ANTICOAGULATION CONSULT NOTE - Follow Up Consult  Pharmacy Consult for Warfarin > heparin Indication: atrial fibrillation  Allergies  Allergen Reactions   Codeine Itching    Patient Measurements: Height: 5\' 3"  (160 cm) Weight: 56.2 kg (123 lb 14.4 oz) IBW/kg (Calculated) : 52.4  Vital Signs: Temp: 98.6 F (37 C) (10/26 2331) Temp Source: Axillary (10/26 2331) BP: 92/56 (10/27 0305) Pulse Rate: 105 (10/27 0000)  Labs: Recent Labs    12/29/20 0310 12/29/20 1152 12/30/20 0307 12/30/20 1350 12/30/20 1824 12/31/20 0218  HGB 7.1*  --  9.2*  --   --  10.0*  HCT 19.6*  --  26.5*  --   --  27.9*  PLT 103*  --  87*  --   --  68*  LABPROT 14.5  --  16.1*  --   --  18.4*  INR 1.1  --  1.3*  --   --  1.5*  HEPARINUNFRC 0.25*   < > <0.10* <0.10*  --  0.53  CREATININE 2.05*   < > 1.44*  --  1.49* 1.52*   < > = values in this interval not displayed.     Estimated Creatinine Clearance: 28.9 mL/min (A) (by C-G formula based on SCr of 1.52 mg/dL (H)).   Assessment: 70 year old female to begin warfarin for Afib, s/p PPM this admission.   INR increased to 6.3 after one dose of warfarin - reversed with vitamin K 5 mg. Holding warfarin and heparin gtt started.   Heparin level therapeutic (0.53) on gtt at 1550 units/hr. Plt down to 68. Hgb up to 10 (s/p transfusion on 10/25). HIT neg. SRA pending   Goal of Therapy:   Heparin level 0.3-0.7 units/ml Monitor platelets by anticoagulation protocol: Yes   Plan:  Continue heparin at 1550 units/hr F/u 6 hr confirmation heparin level  Sherlon Handing, PharmD, BCPS Please see amion for complete clinical pharmacist phone list 12/31/2020 3:28 AM

## 2020-12-31 NOTE — Progress Notes (Signed)
Subjective:   50 UOP-  remained on CRRT overnight -  ended up another 1.5 liters net negative but had to stop pulling as going up on pressors -   still hypoxic but better-  better today mentally  as well but still confused-  per son was independent PTA-  driving, socializing   Objective Vital signs in last 24 hours: Vitals:   12/31/20 0700 12/31/20 0800 12/31/20 0803 12/31/20 0811  BP: 105/63 (!) 86/53 (!) 86/53   Pulse:   (!) 128   Resp: 17 20 (!) 22   Temp:    99.1 F (37.3 C)  TempSrc:    Oral  SpO2: 99% 94% 96%   Weight:      Height:       Weight change: -4.9 kg  Intake/Output Summary (Last 24 hours) at 12/31/2020 0857 Last data filed at 12/31/2020 0800 Gross per 24 hour  Intake 1919.34 ml  Output 3339 ml  Net -1419.66 ml    Assessment/ Plan: Pt is a 70 y.o. yo female with stage 5 CKD at baseline who was admitted on 12/11/2020 with Afib/RVR- then brady-  req pacemaker  Assessment/Plan: 1. Renal-  stage 5 CKD at baseline-  has crossed the threshold to being dialysis requiring this admit-  ESRD-   did IHD times 2 with minimal volume removal-  then decision was made to start CRRT on 10/23 to make more of an impact with volume -   have removed over 6 liters total-  edema better, hypoxia better but still significant- cont hypotension -  but numbers good and volume status stable so will give her a  CRRT holiday.  Best case scenario-  will be stable for IHD on Sat-  will cont to assess daily.  She has a femoral vascath, not ideal.  Eventually will need TDC but feel IR will have concerns re high WBC as well as instability on pressors and hypoxic-  think will need to wait for early next week to get.  Have also not made moves to CLIP as is still pretty unstable.  plans were for PD eventually but will need to be more stable  2. HTN/vol-   has become more of an issue and also with hemodynamic instability-  started on CRRT -  removing 100 per hour-   decreasing O2 req but not great-   coretrak  in can start midodrine to see if can get off pressors-  10 TID 3. Anemia- complicating factor-  have inc ESA-   transfusion 10/25-  will give iv iron as well  4. Secondary hyperparathyroidism-  phos decreasing on crrt, will replete-  calc OK 5. Elytes-  all 4 K bath-  k 4.1 today -  will give some phos as well -  stop bicarb   Louis Meckel    Labs: Basic Metabolic Panel: Recent Labs  Lab 12/30/20 0307 12/30/20 1235 12/30/20 1824 12/31/20 0218  NA 135  --  135 136  K 4.3  --  3.6 4.1  CL 102  --  102 102  CO2 25  --  26 26  GLUCOSE 108*  --  133* 157*  BUN 14  --  14 17  CREATININE 1.44*  --  1.49* 1.52*  CALCIUM 9.2  --  8.8* 9.3  PHOS 2.3*  2.3* 1.9* 1.6* 1.7*   Liver Function Tests: Recent Labs  Lab 12/30/20 0307 12/30/20 1824 12/31/20 0218  ALBUMIN 1.7* 1.7* 2.0*   No results for input(s):  LIPASE, AMYLASE in the last 168 hours. No results for input(s): AMMONIA in the last 168 hours. CBC: Recent Labs  Lab 12/27/20 0418 12/28/20 0543 12/29/20 0310 12/30/20 0307 12/31/20 0218  WBC 19.0* 17.5* 21.1* 24.1* 27.5*  HGB 8.2* 7.3* 7.1* 9.2* 10.0*  HCT 22.8* 20.0* 19.6* 26.5* 27.9*  MCV 97.0 95.2 95.6 92.7 92.7  PLT 212 186 103* 87* 68*   Cardiac Enzymes: No results for input(s): CKTOTAL, CKMB, CKMBINDEX, TROPONINI in the last 168 hours. CBG: Recent Labs  Lab 12/30/20 1640 12/30/20 1923 12/30/20 2328 12/31/20 0342 12/31/20 0809  GLUCAP 104* 119* 147* 165* 146*    Iron Studies: No results for input(s): IRON, TIBC, TRANSFERRIN, FERRITIN in the last 72 hours. Studies/Results: DG Abd Portable 1V  Result Date: 12/30/2020 CLINICAL DATA:  Feeding tube placement. EXAM: PORTABLE ABDOMEN - 1 VIEW COMPARISON:  CT abdomen pelvis 10/03/2019 FINDINGS: Enteric tube tip projects just past the midline in the right abdomen, at the expected location of the gastric antrum. Nonobstructive bowel gas pattern. Surgical staples overlie the lower pelvis. Cholecystectomy  clips. Soft tissue calcifications overlie the bilateral pelvis. Tubing overlying the right pelvis is likely external to the patient. IMPRESSION: Enteric tube tip projects at the expected location of the gastric antrum. Nonobstructive bowel gas pattern. Electronically Signed   By: Ileana Roup M.D.   On: 12/30/2020 11:33   Medications: Infusions:   prismasol BGK 4/2.5 400 mL/hr at 12/31/20 0239    prismasol BGK 4/2.5 200 mL/hr at 12/31/20 4196   sodium chloride     sodium chloride     sodium chloride     albumin human     feeding supplement (VITAL 1.5 CAL) 1,000 mL (12/30/20 1509)   heparin 1,550 Units/hr (12/31/20 0800)   norepinephrine (LEVOPHED) Adult infusion 17 mcg/min (12/31/20 0800)   prismasol BGK 4/2.5 1,000 mL/hr at 12/31/20 0731    Scheduled Medications:  amiodarone  200 mg Oral BID   Followed by   Derrill Memo ON 01/07/2021] amiodarone  200 mg Oral Daily   chlorhexidine  15 mL Mouth Rinse BID   Chlorhexidine Gluconate Cloth  6 each Topical Daily   [START ON 01/03/2021] darbepoetin (ARANESP) injection - NON-DIALYSIS  200 mcg Subcutaneous Q Sun-1800   digoxin  0.125 mg Oral Daily   feeding supplement (PROSource TF)  90 mL Per Tube TID   insulin aspart  1-3 Units Subcutaneous Q4H   mouth rinse  15 mL Mouth Rinse q12n4p   midodrine  10 mg Oral TID WC   polyethylene glycol  17 g Oral BID   senna-docusate  1 tablet Oral BID   sodium bicarbonate  1,300 mg Oral TID   sodium chloride flush  10-40 mL Intracatheter Q12H    have reviewed scheduled and prn medications.  Physical Exam: General: confused but did focus enough to answer some questions today-  much better Heart: RRR Lungs: poor effort, CBS bilat Abdomen: distended-  non tender Extremities: positive peripheral/ sacral edema but better  Dialysis Access: right femoral HD cath placed 10/24    12/31/2020,8:57 AM  LOS: 12 days

## 2020-12-31 NOTE — Plan of Care (Signed)

## 2020-12-31 NOTE — Progress Notes (Addendum)
ANTICOAGULATION CONSULT NOTE - Follow Up Consult  Pharmacy Consult for Warfarin > heparin Indication: atrial fibrillation  Allergies  Allergen Reactions   Codeine Itching    Patient Measurements: Height: 5\' 3"  (160 cm) Weight: 51.3 kg (113 lb 1.5 oz) IBW/kg (Calculated) : 52.4  Vital Signs: Temp: 98.2 F (36.8 C) (10/27 1700) Temp Source: Oral (10/27 1700) BP: 117/66 (10/27 1600) Pulse Rate: 120 (10/27 1326)  Labs: Recent Labs    12/29/20 0310 12/29/20 1152 12/30/20 0307 12/30/20 1350 12/30/20 1824 12/31/20 0218 12/31/20 1014 12/31/20 1051 12/31/20 1400 12/31/20 1547  HGB 7.1*  --  9.2*  --   --  10.0*  --   --   --   --   HCT 19.6*  --  26.5*  --   --  27.9*  --   --   --   --   PLT 103*  --  87*  --   --  68*  --   --   --   --   LABPROT 14.5  --  16.1*  --   --  18.4*  --   --   --   --   INR 1.1  --  1.3*  --   --  1.5*  --   --   --   --   HEPARINUNFRC 0.25*   < > <0.10*   < >  --  0.53   < > >1.10* >1.10* >1.10*  CREATININE 2.05*   < > 1.44*  --  1.49* 1.52*  --   --   --   --    < > = values in this interval not displayed.     Estimated Creatinine Clearance: 28.3 mL/min (A) (by C-G formula based on SCr of 1.52 mg/dL (H)).   Assessment: 70 year old female to begin warfarin for Afib, s/p PPM this admission.   INR increased to 6.3 after one dose of warfarin - reversed with vitamin K 5 mg. Holding warfarin and heparin gtt started. INR down to 1.5.  PM update - Heparin level repeated this afternoon due to high level, previously thought to be contaminated due to drawing from heparin-locked CRRT port; however, peripheral draw also resulted high at >1.1. Discussed with RN to hold heparin x 1 hour and resume at lower rate. He reports no bleeding or issues with infusion. Plt down to 68. Hgb up to 10 (s/p transfusion on 10/25). HIT Ab neg. SRA pending.   Note - CRRT holiday today per Nephrology with plan to do IHD on 10/29 if remains stable; however, per RN, CRRT  will resume this evening.  Goal of Therapy:   Heparin level 0.3-0.7 units/ml Monitor platelets by anticoagulation protocol: Yes   Plan:  Hold heparin x 1 hr and resume at lower rate 1350 units/hr at 1845 - discussed plan with RN F/u 6hr heparin level from resumption - sooner than typical 8hr level with CRRT resuming Monitor daily CBC, s/sx bleeding   Arturo Morton, PharmD, BCPS Please check AMION for all [facility] contact numbers Clinical Pharmacist 12/31/2020 5:39 PM

## 2021-01-01 DIAGNOSIS — I4891 Unspecified atrial fibrillation: Secondary | ICD-10-CM | POA: Diagnosis not present

## 2021-01-01 DIAGNOSIS — J9601 Acute respiratory failure with hypoxia: Secondary | ICD-10-CM

## 2021-01-01 LAB — CBC
HCT: 28.6 % — ABNORMAL LOW (ref 36.0–46.0)
Hemoglobin: 9.6 g/dL — ABNORMAL LOW (ref 12.0–15.0)
MCH: 33 pg (ref 26.0–34.0)
MCHC: 33.6 g/dL (ref 30.0–36.0)
MCV: 98.3 fL (ref 80.0–100.0)
Platelets: 62 10*3/uL — ABNORMAL LOW (ref 150–400)
RBC: 2.91 MIL/uL — ABNORMAL LOW (ref 3.87–5.11)
RDW: 19.5 % — ABNORMAL HIGH (ref 11.5–15.5)
WBC: 35.7 10*3/uL — ABNORMAL HIGH (ref 4.0–10.5)
nRBC: 58.2 % — ABNORMAL HIGH (ref 0.0–0.2)

## 2021-01-01 LAB — HEPATIC FUNCTION PANEL
ALT: 15 U/L (ref 0–44)
AST: 28 U/L (ref 15–41)
Albumin: 1.8 g/dL — ABNORMAL LOW (ref 3.5–5.0)
Alkaline Phosphatase: 176 U/L — ABNORMAL HIGH (ref 38–126)
Bilirubin, Direct: 0.1 mg/dL (ref 0.0–0.2)
Indirect Bilirubin: 0.5 mg/dL (ref 0.3–0.9)
Total Bilirubin: 0.6 mg/dL (ref 0.3–1.2)
Total Protein: 6.1 g/dL — ABNORMAL LOW (ref 6.5–8.1)

## 2021-01-01 LAB — PROTIME-INR
INR: 1.6 — ABNORMAL HIGH (ref 0.8–1.2)
Prothrombin Time: 18.8 seconds — ABNORMAL HIGH (ref 11.4–15.2)

## 2021-01-01 LAB — RENAL FUNCTION PANEL
Albumin: 1.8 g/dL — ABNORMAL LOW (ref 3.5–5.0)
Anion gap: 12 (ref 5–15)
BUN: 35 mg/dL — ABNORMAL HIGH (ref 8–23)
CO2: 24 mmol/L (ref 22–32)
Calcium: 8.9 mg/dL (ref 8.9–10.3)
Chloride: 102 mmol/L (ref 98–111)
Creatinine, Ser: 2.03 mg/dL — ABNORMAL HIGH (ref 0.44–1.00)
GFR, Estimated: 26 mL/min — ABNORMAL LOW (ref >60)
Glucose, Bld: 132 mg/dL — ABNORMAL HIGH (ref 70–99)
Phosphorus: 5.4 mg/dL — ABNORMAL HIGH (ref 2.5–4.6)
Potassium: 5.1 mmol/L (ref 3.5–5.1)
Sodium: 138 mmol/L (ref 135–145)

## 2021-01-01 LAB — SEROTONIN RELEASE ASSAY (SRA)
SRA .2 IU/mL UFH Ser-aCnc: <1 % (ref 0–20)
SRA 100IU/mL UFH Ser-aCnc: <1 % (ref 0–20)

## 2021-01-01 LAB — GLUCOSE, CAPILLARY
Glucose-Capillary: 112 mg/dL — ABNORMAL HIGH (ref 70–99)
Glucose-Capillary: 117 mg/dL — ABNORMAL HIGH (ref 70–99)
Glucose-Capillary: 73 mg/dL (ref 70–99)

## 2021-01-01 LAB — HEPARIN LEVEL (UNFRACTIONATED): Heparin Unfractionated: >1.1 IU/mL — ABNORMAL HIGH (ref 0.30–0.70)

## 2021-01-01 MED ORDER — LORAZEPAM 2 MG/ML IJ SOLN
INTRAMUSCULAR | Status: AC
  Filled 2021-01-01: qty 1

## 2021-01-01 MED ORDER — ALBUMIN HUMAN 25 % IV SOLN
12.5000 g | Freq: Once | INTRAVENOUS | Status: AC
  Administered 2021-01-01: 12.5 g via INTRAVENOUS
  Filled 2021-01-01: qty 50

## 2021-01-01 MED ORDER — AMIODARONE IV BOLUS ONLY 150 MG/100ML
150.0000 mg | Freq: Once | INTRAVENOUS | Status: AC
  Administered 2021-01-01: 150 mg via INTRAVENOUS
  Filled 2021-01-01: qty 100

## 2021-01-01 MED ORDER — MORPHINE SULFATE (PF) 2 MG/ML IV SOLN: 0.5000 mg | INTRAVENOUS | Status: DC | PRN

## 2021-01-01 MED ORDER — ALBUMIN HUMAN 5 % IV SOLN: 12.5000 g | Freq: Once | INTRAVENOUS | Status: DC

## 2021-01-01 MED ORDER — ALBUMIN HUMAN 25 % IV SOLN: 12.5000 g | Freq: Once | INTRAVENOUS | Status: DC

## 2021-01-01 MED ORDER — PHENYLEPHRINE HCL-NACL 20-0.9 MG/250ML-% IV SOLN
0.0000 ug/min | INTRAVENOUS | Status: DC
  Filled 2021-01-01: qty 250

## 2021-01-01 MED ORDER — LORAZEPAM 2 MG/ML IJ SOLN
0.5000 mg | INTRAMUSCULAR | Status: DC | PRN
  Administered 2021-01-01: 0.5 mg via INTRAVENOUS

## 2021-01-01 MED ORDER — ALBUMIN HUMAN 25 % IV SOLN
25.0000 g | Freq: Once | INTRAVENOUS | Status: AC
  Administered 2021-01-01: 25 g via INTRAVENOUS
  Filled 2021-01-01: qty 100

## 2021-01-01 MED ORDER — HEPARIN (PORCINE) 25000 UT/250ML-% IV SOLN: 1000.0000 [IU]/h | INTRAVENOUS | Status: DC

## 2021-01-01 MED ORDER — VASOPRESSIN 20 UNITS/100 ML INFUSION FOR SHOCK
0.0000 [IU]/min | INTRAVENOUS | Status: DC
  Administered 2021-01-01 (×2): 0.03 [IU]/min via INTRAVENOUS
  Filled 2021-01-01 (×2): qty 100

## 2021-01-01 MED ORDER — SODIUM CHLORIDE 0.9 % IV BOLUS: 250.0000 mL | Freq: Once | INTRAVENOUS | Status: DC

## 2021-01-01 MED ORDER — ALBUMIN HUMAN 5 % IV SOLN
INTRAVENOUS | Status: AC
  Filled 2021-01-01: qty 250

## 2021-01-05 NOTE — Progress Notes (Signed)
ANTICOAGULATION CONSULT NOTE - Follow Up Consult  Pharmacy Consult for heparin Indication: atrial fibrillation  Labs: Recent Labs    12/30/20 0307 12/30/20 1350 12/30/20 1824 12/31/20 0218 12/31/20 1014 12/31/20 1400 12/31/20 1547 01-12-21 0353  HGB 9.2*  --   --  10.0*  --   --   --  9.6*  HCT 26.5*  --   --  27.9*  --   --   --  28.6*  PLT 87*  --   --  68*  --   --   --  62*  LABPROT 16.1*  --   --  18.4*  --   --   --   --   INR 1.3*  --   --  1.5*  --   --   --   --   HEPARINUNFRC <0.10*   < >  --  0.53   < > >1.10* >1.10* >1.10*  CREATININE 1.44*  --  1.49* 1.52*  --   --   --   --    < > = values in this interval not displayed.    Assessment: 70yo female remains supratherapeutic on heparin after rate change; no infusion issues or signs of bleeding per RN.  Goal of Therapy:  Heparin level 0.3-0.7 units/ml   Plan:  Will hold heparin x1h then decrease heparin infusion more substantially to 1000 units/hr and check level in 8 hours.    Wynona Neat, PharmD, BCPS  Jan 12, 2021,4:43 AM

## 2021-01-05 NOTE — Progress Notes (Signed)
PCCM Progress Note  Shortly after initiation of comfort measures patient passed. At 1045 confirmed with bedside nurse that there were no heart tones or respiratory function. Family at bedside and with patient at time of transition.  Harlea Goetzinger D. Kenton Kingfisher, NP-C [facility] Personal contact information can be found on Amion  01-28-2021, 11:13 AM

## 2021-01-05 NOTE — Progress Notes (Addendum)
Progress Note  Patient Name: Debra Petersen Date of Encounter: 2021-01-20  [facility] Cardiologist: Dr. Caryl Comes  Subjective   Alert, remains confused  Inpatient Medications    Scheduled Meds:  amiodarone  200 mg Oral BID   Followed by   Derrill Memo ON 01/07/2021] amiodarone  200 mg Oral Daily   chlorhexidine  15 mL Mouth Rinse BID   Chlorhexidine Gluconate Cloth  6 each Topical Daily   [START ON 01/03/2021] darbepoetin (ARANESP) injection - NON-DIALYSIS  200 mcg Subcutaneous Q Sun-1800   [START ON 01/03/2021] digoxin  0.125 mg Intravenous Once   [START ON 01/06/2021] digoxin  0.125 mg Oral Q72H   feeding supplement (PROSource TF)  90 mL Per Tube TID   insulin aspart  1-3 Units Subcutaneous Q4H   mouth rinse  15 mL Mouth Rinse q12n4p   midodrine  10 mg Oral TID WC   polyethylene glycol  17 g Oral BID   senna-docusate  2 tablet Oral BID   sodium chloride flush  10-40 mL Intracatheter Q12H   Continuous Infusions:   prismasol BGK 4/2.5 400 mL/hr at Jan 20, 2021 0615    prismasol BGK 4/2.5 200 mL/hr at 12/31/20 2831   sodium chloride     sodium chloride     sodium chloride     feeding supplement (VITAL 1.5 CAL) 1,000 mL (12/30/20 1509)   ferric gluconate (FERRLECIT) IVPB Stopped (12/31/20 1439)   heparin 1,000 Units/hr (01/20/21 0800)   norepinephrine (LEVOPHED) Adult infusion 37 mcg/min (Jan 20, 2021 0813)   prismasol BGK 4/2.5 1,000 mL/hr at 01-20-2021 0434   vasopressin 0.03 Units/min (01/20/21 0800)   PRN Meds: sodium chloride, sodium chloride, acetaminophen **OR** acetaminophen, food thickener, heparin, HYDROmorphone (DILAUDID) injection, melatonin, [DISCONTINUED] ondansetron **OR** ondansetron (ZOFRAN) IV, oxyCODONE-acetaminophen, sodium chloride, traMADol   Vital Signs    Vitals:   01/20/21 0645 01-20-2021 0700 01/20/21 0746 Jan 20, 2021 0800  BP: 90/66 (!) 89/61 (!) 85/62 (!) 74/54  Pulse:   (!) 110 (!) 113  Resp: 11 17 (!) 22 (!) 21  Temp:      TempSrc:      SpO2: 91% (!) 84%  96% 97%  Weight:      Height:        Intake/Output Summary (Last 24 hours) at January 20, 2021 0839 Last data filed at Jan 20, 2021 0800 Gross per 24 hour  Intake 1792.83 ml  Output 531 ml  Net 1261.83 ml   Last 3 Weights 01/20/21 12/31/2020 12/30/2020  Weight (lbs) 109 lb 9.1 oz 113 lb 1.5 oz 123 lb 14.4 oz  Weight (kg) 49.7 kg 51.3 kg 56.2 kg      Telemetry    AFib, rates 100-140s, infrequent V paced beat - Personally Reviewed  ECG    No new EKGs - Personally Reviewed  Physical Exam   GEN: looks older then her age, chronically ill in appearance, cachectic Neck: No JVD Cardiac: irreg-irreg, tachycardic, no murmurs, rubs, or gallops, warm extremeties Respiratory: diminished at the bases, scattered rales GI: Soft, nontender, non-distended  MS: No edema; No deformity, advanced atrophy for age Neuro:  alert but confused, answers simple pleasantries correctly, follows commands Psych: Normal affect  Pacer site: Is well healed without signs of infection  Labs    High Sensitivity Troponin:   Recent Labs  Lab 12/14/2020 1626 12/08/2020 1940 01/02/2021 0439 12/26/2020 0812  TROPONINIHS 1,843* 1,658* 1,457* 1,294*     Chemistry Recent Labs  Lab 12/30/20 1824 12/31/20 0218 12/31/20 1725 01/20/21 0353  NA 135 136  --  138  K 3.6 4.1  --  5.1  CL 102 102  --  102  CO2 26 26  --  24  GLUCOSE 133* 157*  --  132*  BUN 14 17  --  35*  CREATININE 1.49* 1.52*  --  2.03*  CALCIUM 8.8* 9.3  --  8.9  MG 2.4 2.6* 2.2  --   PROT  --   --   --  6.1*  ALBUMIN 1.7* 2.0*  --  1.8*  1.8*  AST  --   --   --  28  ALT  --   --   --  15  ALKPHOS  --   --   --  176*  BILITOT  --   --   --  0.6  GFRNONAA 38* 37*  --  26*  ANIONGAP 7 8  --  12    Lipids No results for input(s): CHOL, TRIG, HDL, LABVLDL, LDLCALC, CHOLHDL in the last 168 hours.  Hematology Recent Labs  Lab 12/30/20 0307 12/31/20 0218 2021-01-13 0353  WBC 24.1* 27.5* 35.7*  RBC 2.86* 3.01* 2.91*  HGB 9.2* 10.0* 9.6*   HCT 26.5* 27.9* 28.6*  MCV 92.7 92.7 98.3  MCH 32.2 33.2 33.0  MCHC 34.7 35.8 33.6  RDW 16.2* 17.4* 19.5*  PLT 87* 68* 62*   Thyroid No results for input(s): TSH, FREET4 in the last 168 hours.  BNP Recent Labs  Lab 12/27/20 0418  BNP 1,561.2*    DDimer No results for input(s): DDIMER in the last 168 hours.   Radiology    DG CHEST PORT 1 VIEW  Result Date: 12/31/2020 CLINICAL DATA:  Respiratory failure with hypoxia. EXAM: PORTABLE CHEST 1 VIEW COMPARISON:  Chest x-ray 12/27/2020. FINDINGS: There is a new enteric tube extending below the diaphragm. Left-sided pacemaker is unchanged in position. The heart is enlarged, unchanged from the prior examination. Diffuse multifocal airspace disease is again seen in the mid and lower lung predominance. Opacities have increased right. There is a stable small left pleural effusion. There is no pneumothorax. No acute fractures are seen. IMPRESSION: 1. Increasing bilateral airspace disease. 2. Stable small left pleural effusion. 3. New enteric tube extends below the diaphragm. Electronically Signed   By: Ronney Asters M.D.   On: 12/31/2020 19:24   DG Abd Portable 1V  Result Date: 12/30/2020 CLINICAL DATA:  Feeding tube placement. EXAM: PORTABLE ABDOMEN - 1 VIEW COMPARISON:  CT abdomen pelvis 10/03/2019 FINDINGS: Enteric tube tip projects just past the midline in the right abdomen, at the expected location of the gastric antrum. Nonobstructive bowel gas pattern. Surgical staples overlie the lower pelvis. Cholecystectomy clips. Soft tissue calcifications overlie the bilateral pelvis. Tubing overlying the right pelvis is likely external to the patient. IMPRESSION: Enteric tube tip projects at the expected location of the gastric antrum. Nonobstructive bowel gas pattern. Electronically Signed   By: Ileana Roup M.D.   On: 12/30/2020 11:33    Cardiac Studies    12/24/20: TTE (limited) IMPRESSIONS   1. Left ventricular ejection fraction, by estimation,  is 65 to 70%. The  left ventricle has hyperdynamic function. The left ventricle has no  regional wall motion abnormalities.   2. There is a small circumferential pericardial effusion. 20% mitral E  inflow velocity respirophasic variation (likely not significant). IVC is  dilated but collapses normally with inspiration. There is no RV diastolic  collapse. This study is not  suggestive of tamponade.   3. The aortic valve is tricuspid.  4. The inferior vena cava is dilated in size with >50% respiratory  variability, suggesting right atrial pressure of 8 mmHg.   5. Limited echo with no color doppler.   6. The patient was in atrial fibrillation.    12/23/20: TTE (limited) IMPRESSIONS   1. Left ventricular ejection fraction, by estimation, is 60 to 65%. The  left ventricle has normal function. The left ventricle has no regional  wall motion abnormalities.   2. Right ventricular systolic function is hyperdynamic. The right  ventricular size is normal.   3. There is a small circumferential pericardial effusion is present.  There is lack of full expansion of the right ventricle; this could be an  early sign of increased pericardial pressure. Hyperdynamic septum with no  evidence of ventricular  interdependence.   4. The mitral valve is normal in structure. No evidence of mitral valve  regurgitation. No evidence of mitral stenosis.   5. The aortic valve is normal in structure. Aortic valve regurgitation is  not visualized. No aortic stenosis is present.   6. The inferior vena cava is normal in size with greater than 50%  respiratory variability, suggesting right atrial pressure of 3 mmHg.  Conclusion(s)/Recommendation(s): Due to findings of early increased  pericardial pressure, a follow up echocardiogram is recommended in  tomorrow.    12/16/2020: TTE IMPRESSIONS   1. Left ventricular ejection fraction, by estimation, is 65 to 70%. The  left ventricle has normal function. The left  ventricle has no regional  wall motion abnormalities. Left ventricular diastolic parameters are  consistent with Grade I diastolic  dysfunction (impaired relaxation).   2. Right ventricular systolic function is normal. The right ventricular  size is normal. There is normal pulmonary artery systolic pressure. The  estimated right ventricular systolic pressure is 40.9 mmHg.   3. The mitral valve is normal in structure. Mild mitral valve  regurgitation. No evidence of mitral stenosis.   4. The aortic valve is normal in structure. Aortic valve regurgitation is  not visualized. No aortic stenosis is present.   5. The inferior vena cava is dilated in size with >50% respiratory  variability, suggesting right atrial pressure of 8 mmHg.   Patient Profile     70 y.o. female w/PMHx  of AT, HTN, CKD (V) IV, AFlutter (ablated), paroxysmal Afib, admitted with weakness and near syncope, found with pauses/dx with tachy-brady.  Had temp wire placed initially 12/12/2020 PPM implanted 12/17/2020 Post procedure CP > echo 12/23/20 with small pericardial effusion no tamponade mentioned incomplete RV emptying CP resolved Repeat echo 12/24/20 with stable small pericardial effusion, no tamponade  Assessment & Plan    Weakness, near syncope Pauses Paroxysmal Afib Tachy brady Temp pacing wire initially PPM 12/06/2020, POD #10 POD #1 device check with stable measurements POD #1 CXR without PTx Small stable pericardial effusion without tamponade by 2 follow up echos last week This is not contributing to her overall clinical picture  Site is well healed Device check today with stable measurements   --- HRs not perfect, got some amio boluses overnight, 100's-120s Amiodarone gtt >> PO yesterday via CCM added dig cautiously  ---- Heparin gtt  >> probably coumadin once able to take PO/per tube, likely plans for tunneled HD catheter, will again defer switch timing to CCM/nephrology once no further procedures  planned   She has had hypotension and worsening renal function since her admission ---- Worsening SOB > BIPAP >> HFO2 with again increasing O2 requirements at [place] but alert ----  Remains on levo increasing needs and back on vasopressin as well Started on CRRT held yesterday though tried to pull last night and unable to continue with increasing pressor need  Leukocytosis continues to rise ---- had completed antibiotics a few days ago  Continue care with nephrology and CCM  Platelets still low, but seem to be leveling, she was HIT negative  H/H stable, deferred to CCM  Clinical course and trajectory is concerning, her prognosis is poor. Dr. Lovena Le has seen and examined the patient and discussed his concerns of her current clinical course with the patient's son and daughter in law at the bedsi  For questions or updates, please contact [facility] Please consult [email or web address] for contact info under     Signed, Baldwin Jamaica, PA-C  2021/01/22, 8:39 AM    EP Attending  Patient seen and examined. Agree with above. The patient remains criitically ill requiring ionotropic support. She has a progressive leukocytosis despite a course of broad spectrum anti-biotics. She remains hypotensive despite pressors. Her HIT screen was negative. Her atrial fib is reasonably well controlled. We talked to her son and daughter in law regarding her poor prognosis. Also discussed with Pulm/CCM Dr. Carlis Abbott.   Carleene Overlie Odel Schmid,MD

## 2021-01-05 NOTE — Progress Notes (Signed)
  Interdisciplinary Goals of Care Family Meeting   Date carried out:: 01-05-2021  Location of the meeting: Bedside  Member's involved: Nurse Practitioner, Bedside Registered Nurse, and Family Member or next of kin  Durable Power of Attorney or acting medical decision maker: Lovett Calender - Son  Discussion: Continued discussion of goals of care was held with patient and family. Debra Petersen (son) has decided to chang focus of care to a more comfort approach. We will no escalation of care. No additional pressor support or advanced airway will be provided. At this time we will trial low dose Morphine for pain and possible low dose Ativan or anxiety/agitation   Code status: Full DNR  Disposition: In-patient comfort care  Time spent for the meeting: 30   Sanyia Dini D. Kenton Kingfisher, NP-C [facility] Personal contact information can be found on Amion  January 05, 2021, 10:35 AM

## 2021-01-05 NOTE — Death Summary Note (Addendum)
DEATH SUMMARY   Patient Details  Name: Debra Petersen MRN: [id number] DOB: 08-22-1950  Admission/Discharge Information   Admit Date:  January 10, 2021  Date of Death: Date of Death: 23-Jan-2021  Time of Death: Time of Death: 43  Length of Stay: Jun 09, 2022  Referring Physician: Prince Solian, MD   Reason(s) for Hospitalization   Afib with RVR  Diagnoses  Preliminary cause of death: Shock- septic   Secondary Diagnoses (including complications and co-morbidities):  Principal Problem:   Atrial fibrillation with rapid ventricular response ([place]) Active Problems:   Hypothyroidism   Acute renal failure superimposed on stage 5 chronic kidney disease, not on chronic dialysis ([place])   Chronic kidney disease   Hypertension   Tachycardia-bradycardia syndrome ([place])   Protein-calorie malnutrition, severe Cachexia Failure to thrive Worsening shock- septic and cardiogenic Acute respiratory failure with hypoxia AKI on CKD V Lupus nephritis Hypervolemia and edema Afib with RVR Braydcardia, sinus pauses Pericardial effusion Sick sinus syndrome Thrombocytopenia Acute on chronic anemia RLL pneumonia Acute pulmonary edema     Brief Hospital Course (including significant findings, care, treatment, and services provided and events leading to death)  Debra Petersen is a 70 y.o. year old female who presented to [facility] Jan 11, 2023 for weakness and increasing dizziness x 2 days and palpitations. PMHx significant for HTN, HLD, Aflutter (s/p ablation 2010) with paroxysmal Afib (on Rythmol), SLE c/b lupus nephritis (many years ago), CKD stage V (not dialysis-dependent, undergoing transplant eval at [facility]) and hypothyroidism.   In ED, patient was tachycardic to 130s with telemetry demonstrating Afib with RVR. Cardizem gtt was started at slow rate, given patient report of mild bradycardia at home rates 40s-60s). Heparin gtt was initiated for [facility]. Troponin was elevated to 1843, presumed secondary to demand ischemia. Patient was  admitted to [facility] with Cardiology/Nephrology consults.    After 4-5 second pause noted on tele 2023/01/11 with variable HR (50s to 130s), concern raised for sick sinus syndrome and EP was consulted, given complex management of antiarrhythmics in the setting of CKD. Underwent temp pacer placement 10/16 with subsequent permanent pacemaker placement 10/17.   On 10/18PM, patient had brief period of hypotension prompting peripheral low-dose pressor initiation. Additionally, Cr worsened with concern for ARF superimposed on CKD. PCCM was consulted for hypotension/ARF with concern for septic shock.  She was managed with IV antibiotics, vasopressors, renal replacement therapy for filtration and volume removal. Although she waxed and waned throughout her course with periods of improvement, she had persistent shock and had significant worsening despite antibiotics and pressors. She developed rapidly progressive septic shock and family made the decision to transition to focusing on comfort. She passed away with family at bedside on 01/23/2021 at 10:45 AM.   Pertinent Labs and Studies  Significant Diagnostic Studies CARDIAC CATHETERIZATION  Result Date: 12/11/2020 Successful temporary transvenous pacemaker insertion via right IJ Further plans per rounding team.   EP PPM/ICD IMPLANT  Result Date: 12/31/2020 CONCLUSIONS:  1. Successful implantation of a [employer] Jude dual-chamber pacemaker for symptomatic bradycardia due to sinus node dysfunction  2. No early apparent complications.       Cristopher Peru, MD 12/15/2020 11:41 AM    DG CHEST PORT 1 VIEW  Result Date: 12/31/2020 CLINICAL DATA:  Respiratory failure with hypoxia. EXAM: PORTABLE CHEST 1 VIEW COMPARISON:  Chest x-ray 12/27/2020. FINDINGS: There is a new enteric tube extending below the diaphragm. Left-sided pacemaker is unchanged in position. The heart is enlarged, unchanged from the prior examination. Diffuse multifocal airspace disease is again seen in the mid  and lower lung predominance. Opacities have increased right. There is a stable small left pleural effusion. There is no pneumothorax. No acute fractures are seen. IMPRESSION: 1. Increasing bilateral airspace disease. 2. Stable small left pleural effusion. 3. New enteric tube extends below the diaphragm. Electronically Signed   By: Ronney Asters M.D.   On: 12/31/2020 19:24   DG CHEST PORT 1 VIEW  Result Date: 12/27/2020 CLINICAL DATA:  Central line placement EXAM: PORTABLE CHEST 1 VIEW COMPARISON:  12/27/2020 FINDINGS: Right internal jugular Vas-Cath placed with the tip in the right innominate vein. No pneumothorax. Bilateral airspace disease, stable or slightly worsened since prior study. Small bilateral pleural effusions. Heart is normal size. Left pacer remains in place, unchanged. IMPRESSION: Right internal jugular Vas-Cath tip in the right innominate vein. No pneumothorax. Patchy bilateral airspace disease, stable or slightly worsened since prior study. Small bilateral layering effusions. Electronically Signed   By: Rolm Baptise M.D.   On: 12/27/2020 23:59   DG CHEST PORT 1 VIEW  Result Date: 12/27/2020 CLINICAL DATA:  70 year old female with respiratory distress. EXAM: PORTABLE CHEST 1 VIEW COMPARISON:  Portable chest 12/24/2020 and earlier. FINDINGS: Portable AP upright view at 0630 hours. Stable right chest dual lumen dialysis type catheter. Stable lung volumes and mediastinal contours. Stable left chest cardiac pacemaker. Bilateral perihilar opacity, more symmetric now, and with more the reticulonodular, less airspace appearance compared to 3 days ago. Confluent opacity persists at the right lung base, and there is left greater than right lung base veiling opacity. No pneumothorax. No air bronchograms. Paucity of bowel gas in the upper abdomen. IMPRESSION: Ventilation not significantly changed from 3 days ago, with extensive bilateral perihilar reticulonodular opacity superimposed on evidence of  pleural effusions with lower lobe collapse or consolidation. Favor asymmetric pulmonary edema although bilateral pneumonia is possible. Electronically Signed   By: Genevie Ann M.D.   On: 12/27/2020 06:48   DG CHEST PORT 1 VIEW  Result Date: 12/24/2020 CLINICAL DATA:  Arrhythmia, hypoxia EXAM: PORTABLE CHEST 1 VIEW COMPARISON:  12/24/2020 at 2 p.m. FINDINGS: Single frontal view of the chest demonstrates stable dual lead pacer and right internal jugular catheter. The cardiac silhouette is unchanged. Persistent bilateral airspace disease, right greater than left. Bilateral pleural effusions are again seen, left greater than right. No pneumothorax. No acute bony abnormalities. IMPRESSION: 1. Grossly stable bilateral airspace disease and effusions, most compatible with pulmonary edema. Electronically Signed   By: Randa Ngo M.D.   On: 12/24/2020 17:38   DG CHEST PORT 1 VIEW  Result Date: 12/24/2020 CLINICAL DATA:  Central line placement. EXAM: PORTABLE CHEST 1 VIEW COMPARISON:  Prior chest x-ray 12/23/2020 FINDINGS: New right IJ double or triple-lumen catheter in place with its tip near the cavoatrial junction. No complicating features. Stable cardiac size. Persistent asymmetric perihilar airspace process possibly asymmetric pulmonary edema or bilateral infiltrates. Persistent small left effusion. IMPRESSION: New right IJ double-lumen catheter in good position without complicating features. Electronically Signed   By: Marijo Sanes M.D.   On: 12/24/2020 14:27   DG CHEST PORT 1 VIEW  Result Date: 12/23/2020 CLINICAL DATA:  Hypoxemia. EXAM: PORTABLE CHEST 1 VIEW COMPARISON:  Chest x-ray 12/22/2020. FINDINGS: Left-sided pacemaker is unchanged in position. The heart is mildly enlarged, unchanged. There are interstitial and patchy perihilar opacities bilaterally, right greater than left. Small pleural effusions are present. There is no evidence for pneumothorax or acute fracture. IMPRESSION: 1. Cardiomegaly. 2.  Perihilar opacities suggestive of moderate pulmonary edema. Infection is not excluded.  3. Small bilateral pleural effusions. Electronically Signed   By: Ronney Asters M.D.   On: 12/23/2020 15:40   DG CHEST PORT 1 VIEW  Result Date: 12/22/2020 CLINICAL DATA:  Pacemaker placement. EXAM: PORTABLE CHEST 1 VIEW COMPARISON:  Chest x-ray from yesterday. FINDINGS: Unchanged left chest wall pacemaker. Stable cardiomediastinal silhouette. Unchanged mild interstitial thickening at the lung bases and probable trace bilateral pleural effusions. Increasing right infrahilar opacity. Unchanged mild left basilar atelectasis. No pneumothorax. No acute osseous abnormality. IMPRESSION: 1. Increasing right infrahilar opacity may reflect worsening atelectasis or new infiltrate. 2. Unchanged mild interstitial edema and probable trace bilateral pleural effusions. Electronically Signed   By: Titus Dubin M.D.   On: 12/22/2020 10:36   DG CHEST PORT 1 VIEW  Result Date: 12/30/2020 CLINICAL DATA:  Status post pacemaker. EXAM: PORTABLE CHEST 1 VIEW COMPARISON:  Chest x-ray 12/28/2020. FINDINGS: There is a new left-sided dual lead pacemaker in place. There is no evidence for pneumothorax. There are some linear atelectatic changes in the left lung base. The lungs are otherwise clear. There is no pleural effusion identified. Right costophrenic angle has been excluded from the exam. Cardiomediastinal silhouette is within normal limits. IMPRESSION: 1. New left-sided pacemaker in place. 2. No pneumothorax. 3. Left lower lung atelectasis. Electronically Signed   By: Ronney Asters M.D.   On: 12/16/2020 17:00   DG Chest Portable 1 View  Result Date: 12/10/2020 CLINICAL DATA:  Palpitations EXAM: PORTABLE CHEST 1 VIEW COMPARISON:  06/25/2020 FINDINGS: The heart size and mediastinal contours are within normal limits. Both lungs are clear. The visualized skeletal structures are unremarkable. IMPRESSION: No acute abnormality of the lungs in  AP portable projection. Electronically Signed   By: Delanna Ahmadi M.D.   On: 12/10/2020 16:44   DG Abd Portable 1V  Result Date: 12/30/2020 CLINICAL DATA:  Feeding tube placement. EXAM: PORTABLE ABDOMEN - 1 VIEW COMPARISON:  CT abdomen pelvis 10/03/2019 FINDINGS: Enteric tube tip projects just past the midline in the right abdomen, at the expected location of the gastric antrum. Nonobstructive bowel gas pattern. Surgical staples overlie the lower pelvis. Cholecystectomy clips. Soft tissue calcifications overlie the bilateral pelvis. Tubing overlying the right pelvis is likely external to the patient. IMPRESSION: Enteric tube tip projects at the expected location of the gastric antrum. Nonobstructive bowel gas pattern. Electronically Signed   By: Ileana Roup M.D.   On: 12/30/2020 11:33   ECHOCARDIOGRAM COMPLETE  Result Date: 12/30/2020    ECHOCARDIOGRAM REPORT   Patient Name:   Debra Petersen Date of Exam: 12/28/2020 Medical Rec #:  [id number]   Height:       63.0 in Accession #:    [id number]  Weight:       127.9 lb Date of Birth:  1950-12-13    BSA:          1.599 m Patient Age:    8 years    BP:           107/86 mmHg Patient Gender: F           HR:           122 bpm. Exam Location:  Inpatient Procedure: 2D Echo, Cardiac Doppler and Color Doppler STAT ECHO Indications:    for Pacemaker today  History:        Patient has no prior history of Echocardiogram examinations.                 Arrythmias:Atrial Fibrillation, Tachycardia and Heart block.  Sonographer:  Merrie Roof RDCS Referring Phys: [id number] [place]  1. Left ventricular ejection fraction, by estimation, is 65 to 70%. The left ventricle has normal function. The left ventricle has no regional wall motion abnormalities. Left ventricular diastolic parameters are consistent with Grade I diastolic dysfunction (impaired relaxation).  2. Right ventricular systolic function is normal. The right ventricular size is normal. There is  normal pulmonary artery systolic pressure. The estimated right ventricular systolic pressure is 38.2 mmHg.  3. The mitral valve is normal in structure. Mild mitral valve regurgitation. No evidence of mitral stenosis.  4. The aortic valve is normal in structure. Aortic valve regurgitation is not visualized. No aortic stenosis is present.  5. The inferior vena cava is dilated in size with >50% respiratory variability, suggesting right atrial pressure of 8 mmHg. FINDINGS  Left Ventricle: Left ventricular ejection fraction, by estimation, is 65 to 70%. The left ventricle has normal function. The left ventricle has no regional wall motion abnormalities. The left ventricular internal cavity size was normal in size. There is  no left ventricular hypertrophy. Left ventricular diastolic parameters are consistent with Grade I diastolic dysfunction (impaired relaxation). Right Ventricle: The right ventricular size is normal. No increase in right ventricular wall thickness. Right ventricular systolic function is normal. There is normal pulmonary artery systolic pressure. The tricuspid regurgitant velocity is 2.08 m/s, and  with an assumed right atrial pressure of 8 mmHg, the estimated right ventricular systolic pressure is 50.5 mmHg. Left Atrium: Left atrial size was normal in size. Right Atrium: Right atrial size was normal in size. Pericardium: There is no evidence of pericardial effusion. Mitral Valve: The mitral valve is normal in structure. Mild mitral valve regurgitation. No evidence of mitral valve stenosis. Tricuspid Valve: The tricuspid valve is normal in structure. Tricuspid valve regurgitation is mild . No evidence of tricuspid stenosis. Aortic Valve: The aortic valve is normal in structure. Aortic valve regurgitation is not visualized. No aortic stenosis is present. Aortic valve mean gradient measures 3.0 mmHg. Aortic valve peak gradient measures 5.6 mmHg. Aortic valve area, by VTI measures 2.21 cm. Pulmonic Valve:  The pulmonic valve was normal in structure. Pulmonic valve regurgitation is not visualized. No evidence of pulmonic stenosis. Aorta: The aortic root is normal in size and structure. Venous: The inferior vena cava is dilated in size with greater than 50% respiratory variability, suggesting right atrial pressure of 8 mmHg. IAS/Shunts: No atrial level shunt detected by color flow Doppler. Additional Comments: A device lead is visualized in the right ventricle.  LEFT VENTRICLE PLAX 2D LVIDd:         3.70 cm LVIDs:         2.70 cm LV PW:         1.10 cm LV IVS:        1.00 cm LVOT diam:     2.00 cm LV SV:         42 LV SV Index:   26 LVOT Area:     3.14 cm  RIGHT VENTRICLE             IVC RV Basal diam:  2.60 cm     IVC diam: 2.40 cm RV S prime:     14.70 cm/s TAPSE (M-mode): 1.1 cm LEFT ATRIUM             Index        RIGHT ATRIUM           Index LA diam:  3.90 cm 2.44 cm/m   RA Area:     15.00 cm LA Vol (A2C):   56.3 ml 35.21 ml/m  RA Volume:   36.70 ml  22.95 ml/m LA Vol (A4C):   46.8 ml 29.27 ml/m LA Biplane Vol: 54.1 ml 33.84 ml/m  AORTIC VALVE AV Area (Vmax):    2.45 cm AV Area (Vmean):   2.09 cm AV Area (VTI):     2.21 cm AV Vmax:           118.00 cm/s AV Vmean:          84.800 cm/s AV VTI:            0.189 m AV Peak Grad:      5.6 mmHg AV Mean Grad:      3.0 mmHg LVOT Vmax:         92.20 cm/s LVOT Vmean:        56.400 cm/s LVOT VTI:          0.133 m LVOT/AV VTI ratio: 0.70  AORTA Ao Root diam: 2.80 cm Ao Asc diam:  2.70 cm TRICUSPID VALVE TR Peak grad:   17.3 mmHg TR Vmax:        208.00 cm/s  SHUNTS Systemic VTI:  0.13 m Systemic Diam: 2.00 cm Candee Furbish MD Electronically signed by Candee Furbish MD Signature Date/Time: 12/10/2020/9:33:05 AM    Final    ECHOCARDIOGRAM LIMITED  Result Date: 12/24/2020    ECHOCARDIOGRAM LIMITED REPORT   Patient Name:   Debra Petersen Date of Exam: 12/24/2020 Medical Rec #:  [id number]   Height:       63.0 in Accession #:    [id number]  Weight:       138.4 lb Date of  Birth:  20-Oct-1950    BSA:          1.654 m Patient Age:    35 years    BP:           110/58 mmHg Patient Gender: F           HR:           90 bpm. Exam Location:  Inpatient Procedure: Limited Echo, Limited Color Doppler, Cardiac Doppler and Intracardiac            Opacification Agent STAT ECHO Indications:    Pericardial effusion I31.3  History:        Patient has prior history of Echocardiogram examinations, most                 recent 12/23/2020.  Sonographer:    Bernadene Person RDCS Referring Phys: [id number] [place]  1. Left ventricular ejection fraction, by estimation, is 65 to 70%. The left ventricle has hyperdynamic function. The left ventricle has no regional wall motion abnormalities.  2. There is a small circumferential pericardial effusion. 20% mitral E inflow velocity respirophasic variation (likely not significant). IVC is dilated but collapses normally with inspiration. There is no RV diastolic collapse. This study is not suggestive of tamponade.  3. The aortic valve is tricuspid.  4. The inferior vena cava is dilated in size with >50% respiratory variability, suggesting right atrial pressure of 8 mmHg.  5. Limited echo with no color doppler.  6. The patient was in atrial fibrillation. FINDINGS  Left Ventricle: Left ventricular ejection fraction, by estimation, is 65 to 70%. The left ventricle has hyperdynamic function. The left ventricle has no regional wall motion abnormalities. Definity contrast agent was given IV  to delineate the left ventricular endocardial borders. Pericardium: There is a small pericardial effusion. 20% mitral E inflow velocity respirophasic variation (likely not significant). IVC is dilated but collapses normally with inspiration. There is no RV diastolic collapse. This study is not suggestive of tamponade. A small pericardial effusion is present. Aortic Valve: The aortic valve is tricuspid. Venous: The inferior vena cava is dilated in size with greater  than 50% respiratory variability, suggesting right atrial pressure of 8 mmHg. Additional Comments: A device lead is visualized in the right ventricle. [employer] Electronically signed by Franki Monte Signature Date/Time: 12/24/2020/11:22:31 AM    Final    ECHOCARDIOGRAM LIMITED  Result Date: 12/23/2020    ECHOCARDIOGRAM LIMITED REPORT   Patient Name:   Debra Petersen Date of Exam: 12/23/2020 Medical Rec #:  [id number]   Height:       63.0 in Accession #:    [id number]  Weight:       143.3 lb Date of Birth:  1951/02/07    BSA:          1.678 m Patient Age:    61 years    BP:           111/89 mmHg Patient Gender: F           HR:           139 bpm. Exam Location:  Inpatient Procedure: Limited Echo, Cardiac Doppler and Color Doppler Indications:    Recent pacemaker placement, concern for pericardial effusion  History:        Patient has prior history of Echocardiogram examinations, most                 recent 12/12/2020. Risk Factors:None.  Sonographer:    Maudry Mayhew MHA, RDMS, RVT, RDCS Referring Phys: [id number] [place]  1. Left ventricular ejection fraction, by estimation, is 60 to 65%. The left ventricle has normal function. The left ventricle has no regional wall motion abnormalities.  2. Right ventricular systolic function is hyperdynamic. The right ventricular size is normal.  3. There is a small circumferential pericardial effusion is present. There is lack of full expansion of the right ventricle; this could be an early sign of increased pericardial pressure. Hyperdynamic septum with no evidence of ventricular interdependence.  4. The mitral valve is normal in structure. No evidence of mitral valve regurgitation. No evidence of mitral stenosis.  5. The aortic valve is normal in structure. Aortic valve regurgitation is not visualized. No aortic stenosis is present.  6. The inferior vena cava is normal in size with greater than 50% respiratory variability, suggesting right  atrial pressure of 3 mmHg. Conclusion(s)/Recommendation(s): Due to findings of early increased pericardial pressure, a follow up echocardiogram is recommended in tomorrow. FINDINGS  Left Ventricle: Left ventricular ejection fraction, by estimation, is 60 to 65%. The left ventricle has normal function. The left ventricle has no regional wall motion abnormalities. The left ventricular internal cavity size was normal in size. There is  no left ventricular hypertrophy. Right Ventricle: There is lack of full expansion of the right ventricle. The right ventricular size is normal. No increase in right ventricular wall thickness. Right ventricular systolic function is hyperdynamic. Left Atrium: Left atrial size was normal in size. Right Atrium: Right atrial size was not well visualized. Pericardium: A small pericardial effusion is present. The pericardial effusion is circumferential. There is excessive respiratory variation in the mitral valve spectral Doppler velocities and There is lack of full expansion  of the right ventricle; this could be an early sign of increased pericardial pressure. Hyperdynamic septum with no evidence of ventricular interdependence. Mitral Valve: The mitral valve is normal in structure. No evidence of mitral valve stenosis. Tricuspid Valve: The tricuspid valve is normal in structure. Tricuspid valve regurgitation is not demonstrated. No evidence of tricuspid stenosis. Aortic Valve: The aortic valve is normal in structure. Aortic valve regurgitation is not visualized. No aortic stenosis is present. Pulmonic Valve: The pulmonic valve was normal in structure. Pulmonic valve regurgitation is not visualized. No evidence of pulmonic stenosis. Aorta: The aortic root is normal in size and structure. Venous: The inferior vena cava is normal in size with greater than 50% respiratory variability, suggesting right atrial pressure of 3 mmHg. IAS/Shunts: No atrial level shunt detected by color flow Doppler.  LEFT VENTRICLE PLAX 2D LVIDd:         3.65 cm LVIDs:         2.70 cm LV PW:         0.70 cm LV IVS:        0.60 cm LVOT diam:     2.10 cm LVOT Area:     3.46 cm  LEFT ATRIUM         Index LA diam:    3.45 cm 2.06 cm/m   AORTA Ao Root diam: 2.70 cm  SHUNTS Systemic Diam: 2.10 cm Kardie Tobb DO Electronically signed by Berniece Salines DO Signature Date/Time: 12/23/2020/11:49:07 AM    Final     Microbiology Recent Results (from the past 240 hour(s))  Culture, blood (routine x 2)     Status: None   Collection Time: 12/23/20  2:50 PM   Specimen: BLOOD  Result Value Ref Range Status   Specimen Description BLOOD ARTERIAL  Final   Special Requests   Final    BOTTLES DRAWN AEROBIC AND ANAEROBIC Blood Culture adequate volume   Culture   Final    NO GROWTH 5 DAYS Performed at [facility], [address] [place], [place] [address]    Report Status 12/28/2020 FINAL  Final  Culture, blood (routine x 2)     Status: None   Collection Time: 12/23/20  2:58 PM   Specimen: BLOOD  Result Value Ref Range Status   Specimen Description BLOOD LEFT ANTECUBITAL  Final   Special Requests   Final    BOTTLES DRAWN AEROBIC AND ANAEROBIC Blood Culture adequate volume   Culture   Final    NO GROWTH 5 DAYS Performed at [facility], [place] [address], [place], [place] [address]    Report Status 12/28/2020 FINAL  Final    Lab Basic Metabolic Panel: Recent Labs  Lab 12/29/20 1607 12/30/20 0307 12/30/20 1235 12/30/20 1824 12/31/20 0218 12/31/20 1725 January 07, 2021 0353  NA 134* 135  --  135 136  --  138  K 4.5 4.3  --  3.6 4.1  --  5.1  CL 101 102  --  102 102  --  102  CO2 24 25  --  26 26  --  24  GLUCOSE 109* 108*  --  133* 157*  --  132*  BUN 19 14  --  14 17  --  35*  CREATININE 1.69* 1.44*  --  1.49* 1.52*  --  2.03*  CALCIUM 9.0 9.2  --  8.8* 9.3  --  8.9  MG  --  2.4 DUPLICATE REQUEST  2.5* 2.4 2.6* 2.2  --  PHOS 3.1 2.3*  2.3* 1.9* 1.6* 1.7* 6.4* 5.4*   Liver Function Tests: Recent  Labs  Lab 12/29/20 1607 12/30/20 0307 12/30/20 1824 12/31/20 0218 2021-01-11 0353  AST  --   --   --   --  28  ALT  --   --   --   --  15  ALKPHOS  --   --   --   --  176*  BILITOT  --   --   --   --  0.6  PROT  --   --   --   --  6.1*  ALBUMIN 1.8* 1.7* 1.7* 2.0* 1.8*  1.8*   No results for input(s): LIPASE, AMYLASE in the last 168 hours. No results for input(s): AMMONIA in the last 168 hours. CBC: Recent Labs  Lab 12/28/20 0543 12/29/20 0310 12/30/20 0307 12/31/20 0218 2021/01/11 0353  WBC 17.5* 21.1* 24.1* 27.5* 35.7*  HGB 7.3* 7.1* 9.2* 10.0* 9.6*  HCT 20.0* 19.6* 26.5* 27.9* 28.6*  MCV 95.2 95.6 92.7 92.7 98.3  PLT 186 103* 87* 68* 62*   Cardiac Enzymes: No results for input(s): CKTOTAL, CKMB, CKMBINDEX, TROPONINI in the last 168 hours. Sepsis Labs: Recent Labs  Lab 12/29/20 0310 12/30/20 0307 12/31/20 0218 11-Jan-2021 0353  WBC 21.1* 24.1* 27.5* 35.7*    Procedures/Operations  Dialysis catheter insertion Central venous line insertion PPM implantation   Julian Hy 01-11-21, 3:35 PM

## 2021-01-05 NOTE — Progress Notes (Signed)
[place] Progress Note Patient Name: Grey Schlauch DOB: June 22, 1950 MRN: [id number]   Date of Service  2021-01-30  HPI/Events of Note  BP 78/55, MAP 64, patient is now on 30 mcg of Norepinephrine (up from 7 mcg at shift onset), serum albumin 2.0, last echo with EF of 65 % and estimated RAP of 8, EKG Afib with RVR (rate 126), CRRT paused.  eICU Interventions  Vasopressin gtt ordered at 0.03 mcg, Albumin 25 % 25 gm iv x 1 ordered, Amiodarone 150 mg iv x 1 ordered ( QTC 471 on most recent EKG).        Kerry Kass Jameal Razzano 2021/01/30, 3:43 AM

## 2021-01-05 NOTE — Progress Notes (Signed)
NAME:  Debra Petersen, MRN:  [id number], DOB:  [birth year], LOS: 74 ADMISSION DATE:  12/08/2020 CONSULTATION DATE:  12/23/2020 REFERRING MD:  Tyrell Antonio - [facility] CHIEF COMPLAINT: Hypotension, possible sepsis   History of Present Illness:  70 year old woman who presented to [facility] 10/15 for weakness and increasing dizziness x 2 days and palpitations. 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  In ED, patient was tachycardic to 130s with telemetry demonstrating Afib with RVR. Cardizem gtt was started at slow rate, given patient report of mild bradycardia at home rates 40s-60s). Heparin gtt was initiated for [facility]. Troponin was elevated to 1843, presumed secondary to demand ischemia. Patient was admitted to [facility] with Cardiology/Nephrology consults.   After 4-5 second pause noted on tele 10/15 with variable HR (50s to 130s), concern raised for sick sinus syndrome and EP was consulted, given complex management of antiarrhythmics in the setting of CKD. Underwent temp pacer placement 10/16 with subsequent permanent pacemaker placement 10/17.  On 10/18PM, patient had brief period of hypotension prompting peripheral low-dose pressor initiation. Additionally, Cr worsened with concern for ARF superimposed on CKD. PCCM was consulted for hypotension/ARF with concern for sepsis.  Pertinent Medical History:   Past Medical History:  Diagnosis Date   CKD (chronic kidney disease)    Lupus (systemic lupus erythematosus) (HCC)    Wide-complex tachycardia    probable atrial flutter with 1-1 and 2-1 conduction   Significant Hospital Events: Including procedures, antibiotic start and stop dates in addition to other pertinent events   10/15 Presented 10/15 for weakness and increasing dizziness x 2 days and palpitations. 10/16 EP was consulted,  given complex management of antiarrhythmics in the setting of CKD. Underwent temp pacer placement  10/17 Permanent pacemaker placed. 10/19 - PCCM consulted in the setting of hypotension with possible sepsis, low-dose pressor requirement. ECHO with new pericardial effusion 10/20 - re consulted for recurrent hypotension req pressors. C/o chest pain. Needs HD cath placed. Broadening abx   10/21 chest pain feeling better. Got first HD run this morning 10/22 on [place]. More confused. On low dose NE.  10/23 on NE, vaso and BiPAP after 1L volume removal iHD overnight  10/24 dialysis catheter repositioned.  Patient did not receive adequate ultrafiltration overnight 10/25 continues to have marginal saturations off BiPAP 10/26 weaned off BiPAP to heated high flow nasal cannula. 10/28 Remains on HFNC with increased oxygen demand, now maxed on Levophed and vasopressin with progressive hypotension.  Considering addition of third pressor.  Interim History / Subjective:  Patient is now maxed on two pressors with decreasing menadione. Family is at bedside and has been updated   Objective:  Blood pressure (!) 74/54, pulse (!) 113, temperature 97.8 F (36.6 C), temperature source Axillary, resp. rate (!) 21, height 5\' 3"  (1.6 m), weight 49.7 kg, SpO2 97 %.    FiO2 (%):  [70 %-80 %] 80 %   Intake/Output Summary (Last 24 hours) at 01/14/21 6834 Last data filed at 01/14/2021 0800 Gross per 24 hour  Intake 1792.83 ml  Output 531 ml  Net 1261.83 ml    Filed Weights   12/30/20 0500 12/31/20 0400 2021-01-14 0300  Weight: 56.2 kg 51.3 kg 49.7 kg   Physical Examination: General: Very deconditioned thin, frail acute ill appearing elderly female lying in bed, in NAD HEENT: [place]/AT, MM pink/moist, PERRL,  Neuro: Alert and  oriented x3 with underlying lethargy, unable to converse in goals of care talk CV: s1s2 regular rate and rhythm, no murmur, rubs, or gallops,  PULM:  Diminished air entry bilaterally, mild  tachypnea on HFNC GI: soft, bowel sounds active in all 4 quadrants, non-tender, non-distended Extremities: warm/dry, no edema  Skin: no rashes or lesions, scattered bruising   Resolved Hospital Problem List:    Assessment & Plan:  Acute respiratory failure with hypoxia requiring noninvasive ventilation due to volume overload and bacterial pneumonia -Patient has needed intermittent BIPAP or HFNC throughout stay. 10/28 FIO2 requirements continue to rise. Lung mechanics also appear very diminished with concern for future ability to protect her airway  P: Oxygen requirements are increase despite aggressive measures Continue HFNC, FIO2 80 Family discussing plan of care regarding advanced airway needed  Pulmonary hygiene  Elevated HOB   Distributive shock presumably secondary to bacterial pneumonia -S/P 7 days of Cefepime and Vancomycin  -10/28 WBC continue to rise with T-max of 100.2 P: Culture data remains negative  Consider adding resuming antibiotics and adding antifungals  Supplemental oxygen as above  Continue pressors or MAP goal > 65 family discussing goals of care and possible need for additional pressors   Tachy brady syndrome  -Underwent temp pacer placement 10/16 with subsequent permanent pacemaker placement 10/17. Atrial fibrillation with rapid ventricular response  P: Primary management per EP, appreciate assistance  On PO Amio and Dig  Strict intake and output  Daily weight to assess volume status Volume removal per HD Provide supplemtal oxygen as needed to maintain oxygen saturations above 90%  Acute kidney injury superimposed on stage V kidney disease requiring CRRT P: Nephrology following, appreciate assistance  Remains on CRRT, running even due to severe hypotension  Follow renal function  Monitor urine output Trend Bmet Avoid nephrotoxins Ensure adequate renal perfusion   Anticoagulation related coagulopathy P: CBC and INR remains stable  Monitor for  sins of bleeding  Trend CBC  Transfuse per proocolo  Pericardial effusion without tamponade -Seen on ECHO 10/19 and 10/20 P: As hemodynamics allow volume removal per CRRT   Failure to thrive   Cachexia with severe protein calorie malnutrition Dysphagia -Cortrak removed by patient 10/28 P: Supportive care as able   Goals of care  Given progressive multi-system organ dysfunction despite agressive measure a goals of care discussion was held with son and hs spouse. Long conversation held regarding all possibilities including oscillation of agressive care and comfort care. Patient and son care currently discussing all option.    Best Practice: (right click and "Reselect all SmartList Selections" daily)   Diet-nectar thick  / NPO on BiPAP Code Status: Full  VTE ppx: SCD GI ppx: n/a  Goals of care: 10/24 pt and son at bedside  Lines- temp HD cath [place]  Performed by: Vivianna Piccini D. Harris  Total critical care time: 45 minutes  Critical care time was exclusive of separately billable procedures and treating other patients.  Critical care was necessary to treat or prevent imminent or life-threatening deterioration.  Critical care was time spent personally by me on the following activities: development of treatment plan with patient and/or surrogate as well as nursing, discussions with consultants, evaluation of patient's response to treatment, examination of patient, obtaining history from patient or surrogate, ordering and performing treatments and interventions, ordering and review of laboratory studies, ordering and review of radiographic studies, pulse oximetry and re-evaluation of patient's condition.  Cayetano Mikita D. Kenton Kingfisher, NP-C [facility]  Personal contact information can be found on Amion  01-03-21, 10:14 AM

## 2021-01-05 DEATH — deceased

## 2021-01-15 ENCOUNTER — Encounter (HOSPITAL_COMMUNITY): Payer: Medicare Other

## 2021-01-25 ENCOUNTER — Encounter: Payer: Medicare Other | Admitting: Student

## 2021-03-26 ENCOUNTER — Encounter: Payer: Medicare Other | Admitting: Internal Medicine

## 2021-05-17 IMAGING — CR DG CHEST 2V
2 series · 2 of 2 positions shown · non-contrast
Comparison: Chest x-ray 10/03/2019.

CLINICAL DATA: Cough.

EXAM:
CHEST - 2 VIEW

[chest ap]
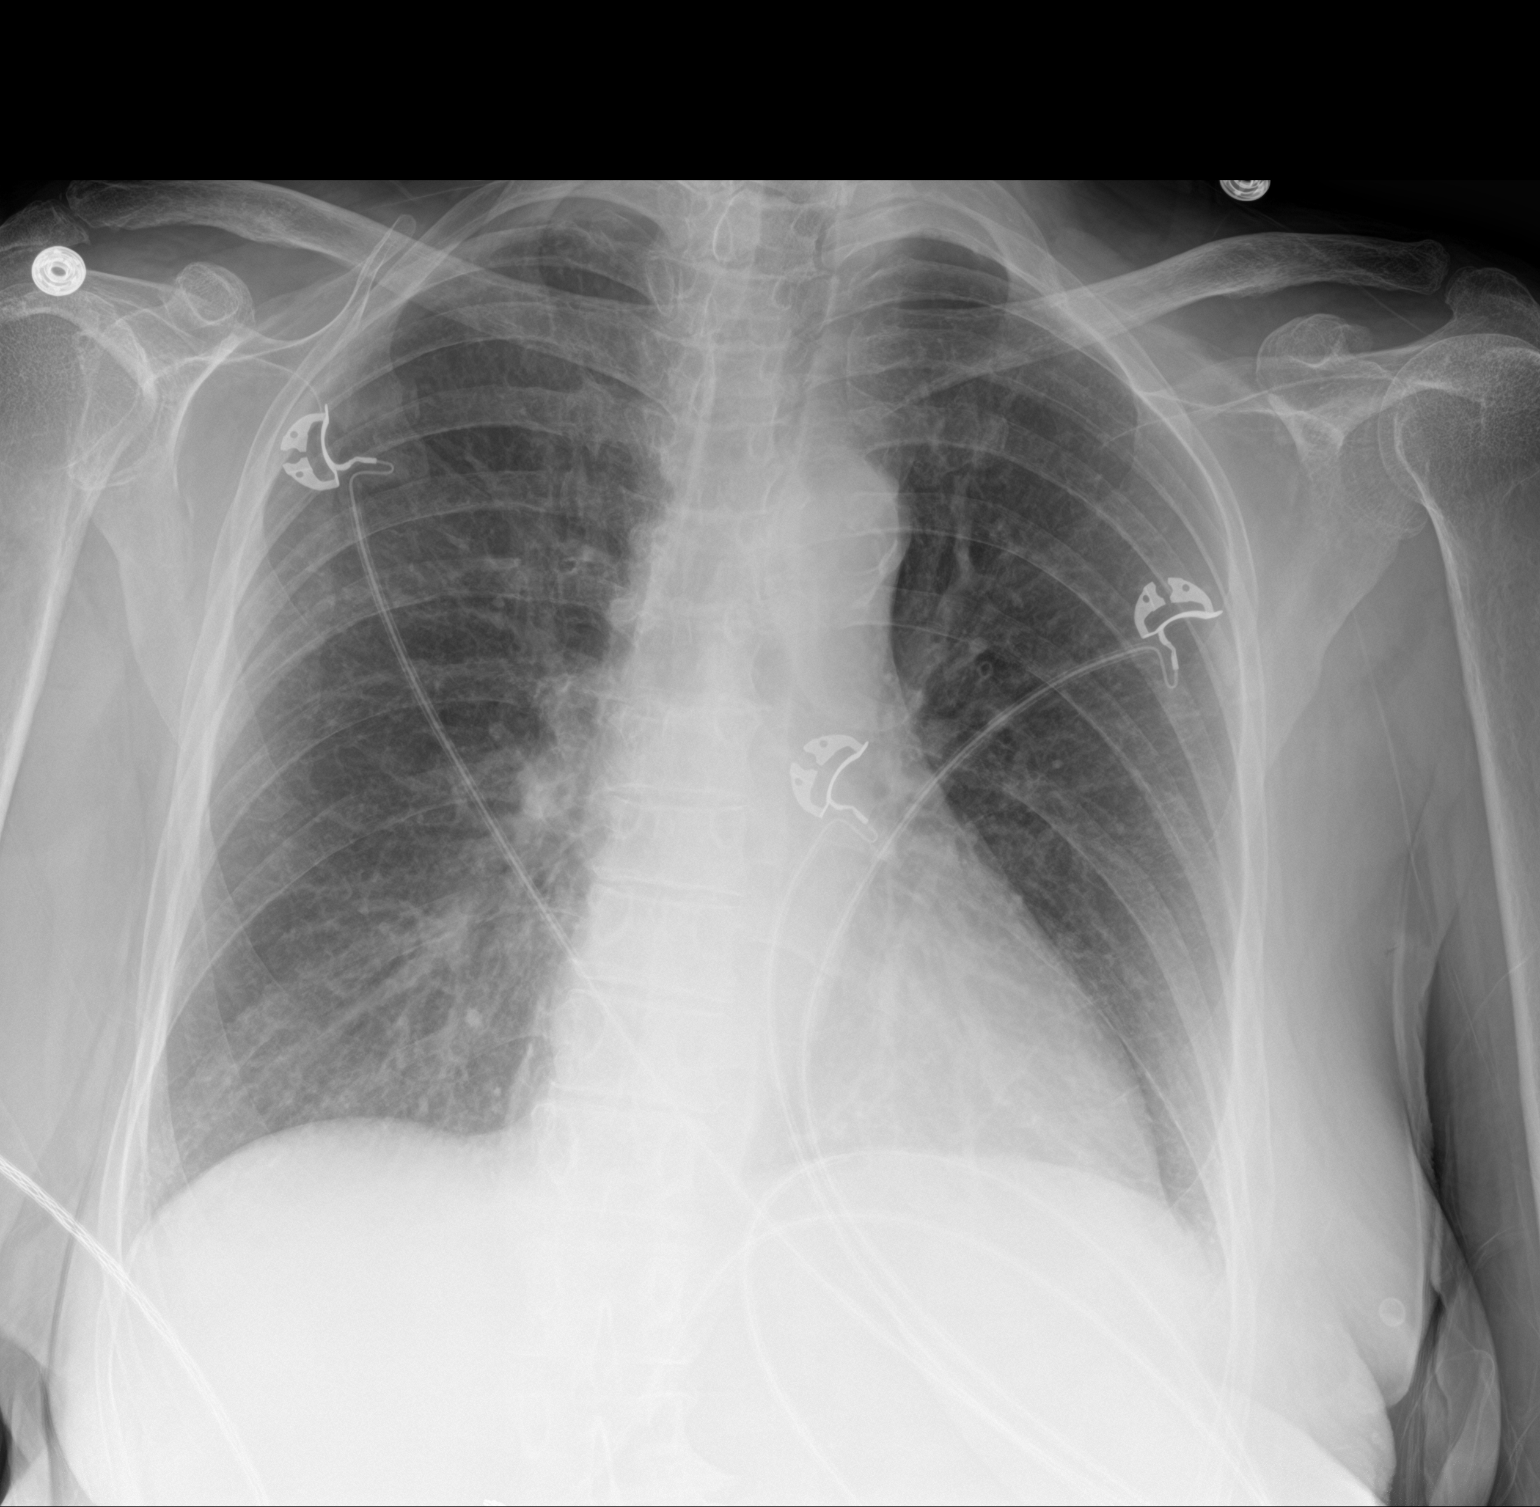

[chest lat]
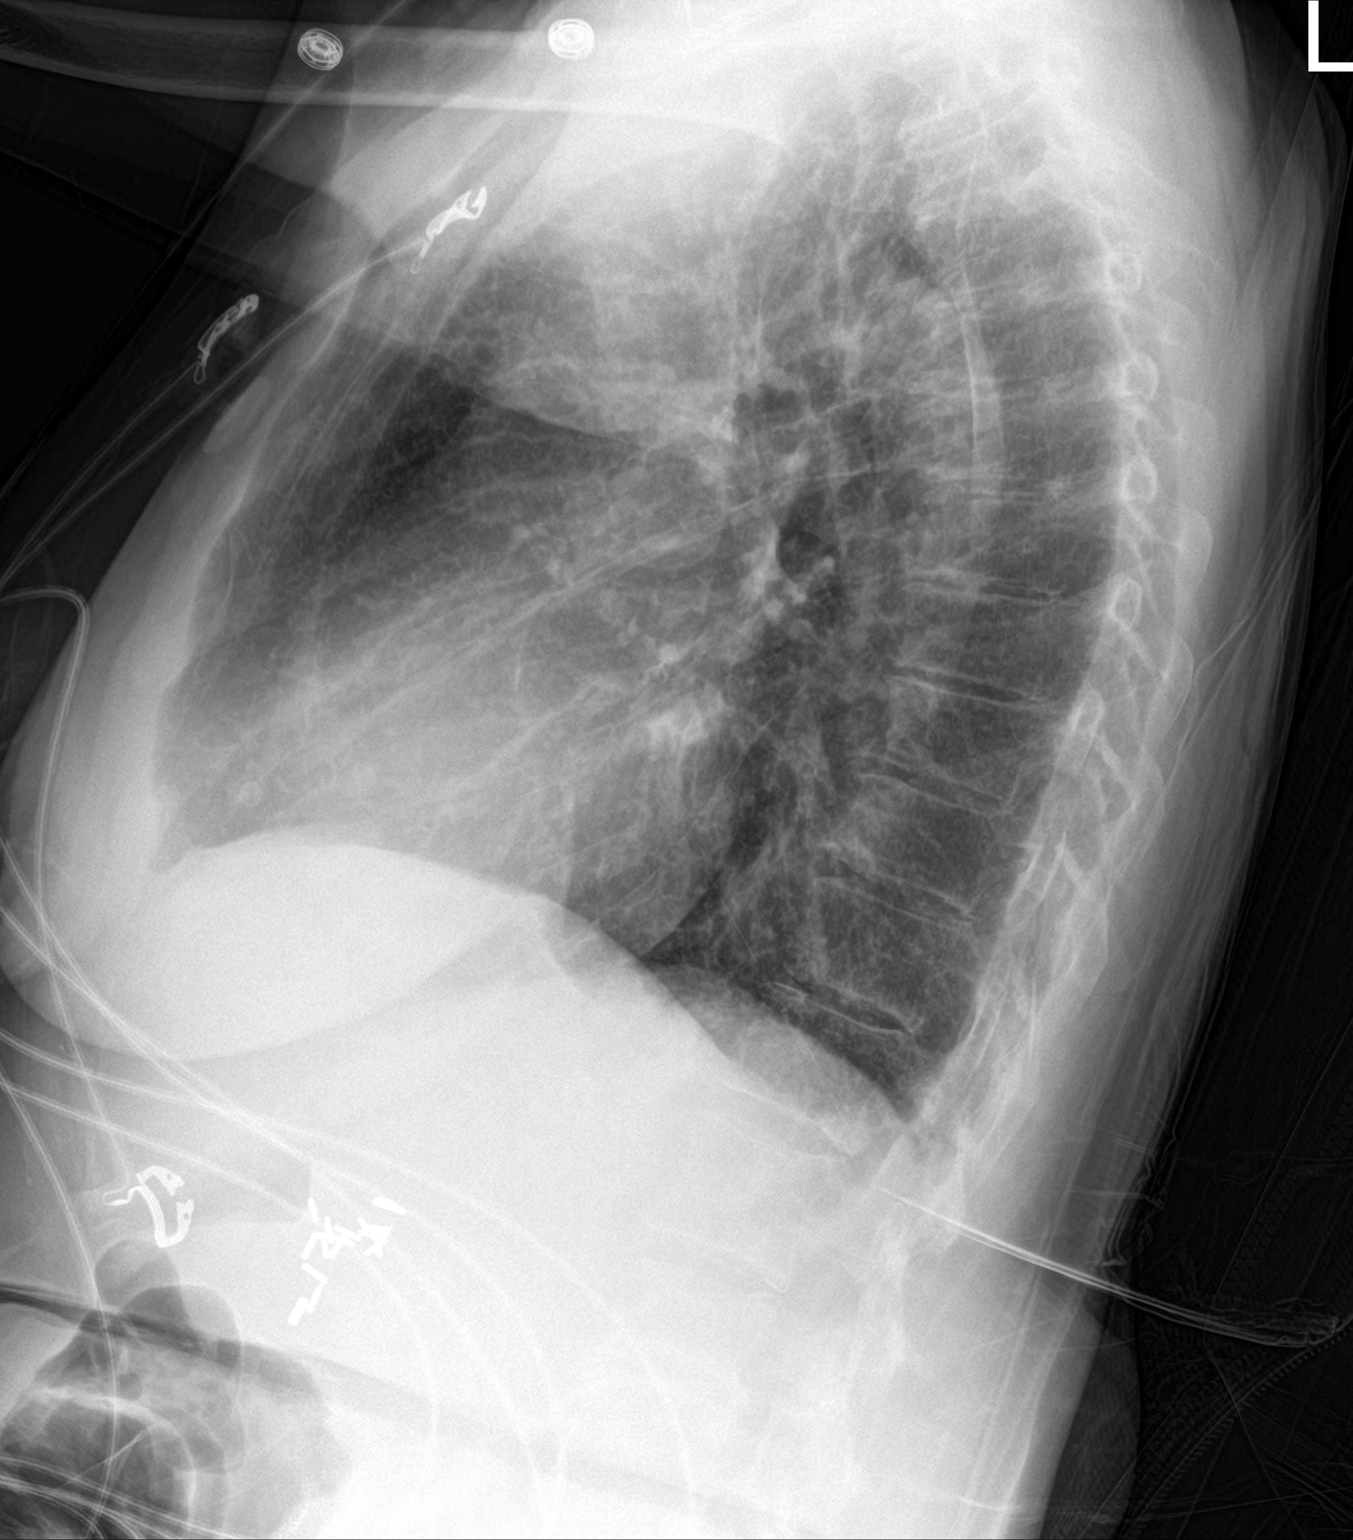

[2 of 2 positions shown; findings below may reference images not displayed]

FINDINGS: Mediastinum hilar structures normal. Cardiomegaly. Mild right base
atelectasis/infiltrate cannot be excluded. No pleural effusion or
pneumothorax. Degenerative change thoracic spine. Surgical clips
upper abdomen.
IMPRESSION: 1.  Cardiomegaly.  No pulmonary venous congestion.

2.  Mild right base atelectasis/infiltrate cannot be excluded.

## 2021-05-19 IMAGING — CT CT CHEST W/O CM
2 of 3 series · 15 of 36 positions shown, 18 images · non-contrast
Comparison: Chest radiograph 06/25/2020

CLINICAL DATA: Cough.

EXAM:
CT CHEST WITHOUT CONTRAST
TECHNIQUE: Multidetector CT imaging of the chest was performed following the
standard protocol without IV contrast.

[Series 3: chest w/o 2mm st · axial · non-contrast · 0.69mm/px · z∈[-118,+148]mm · 12 of 157 slices shown, 15 images]
[im 12/157  mediastinal]
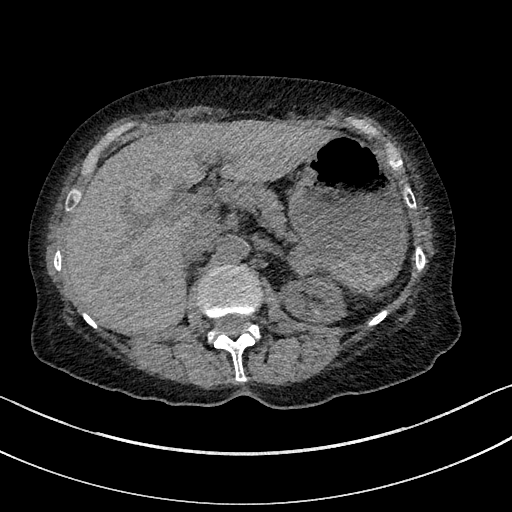
[im 12/157  lung]
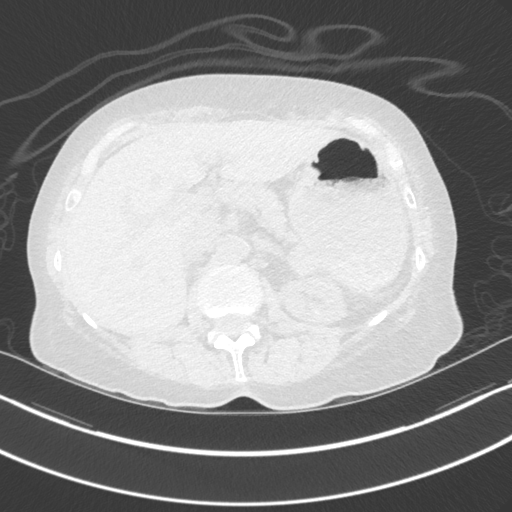
[im 24/157  lung]
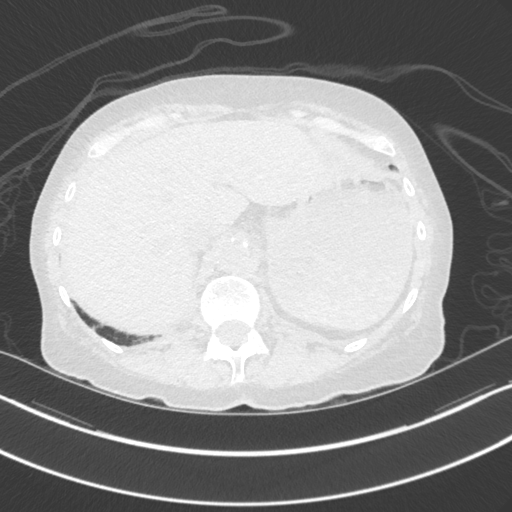
[im 35/157  lung]
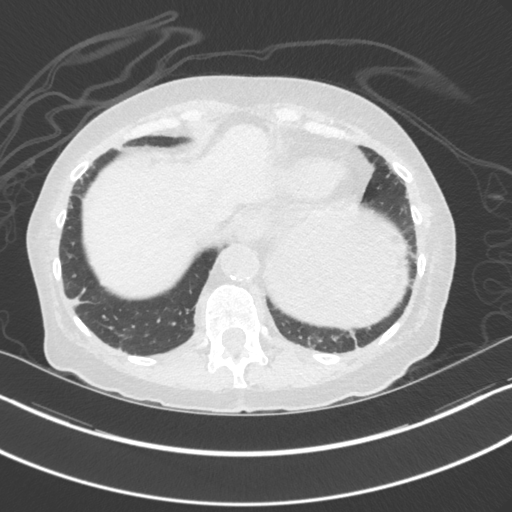
[im 47/157  lung]
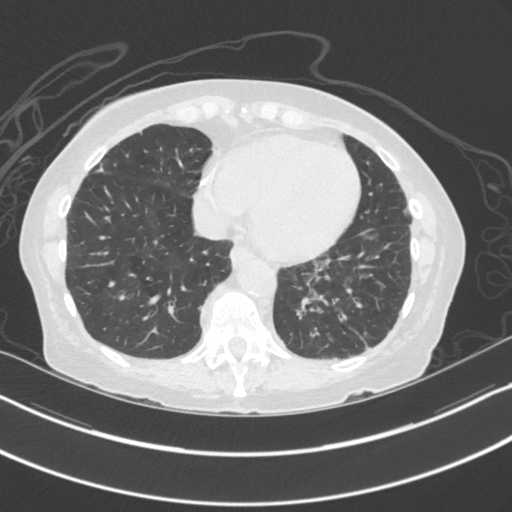
[im 58/157  mediastinal]
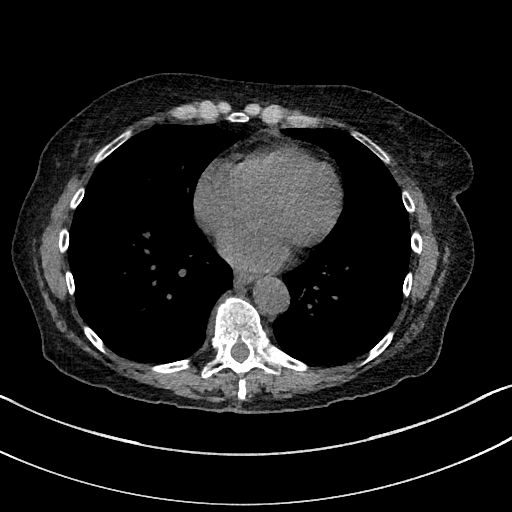
[im 58/157  lung]
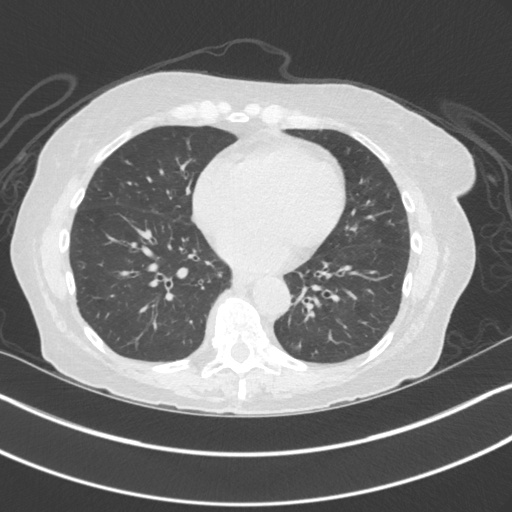
[im 70/157  lung]
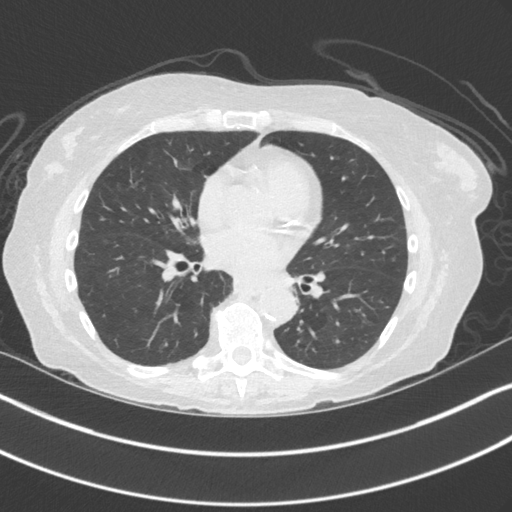
[im 87/157  lung]
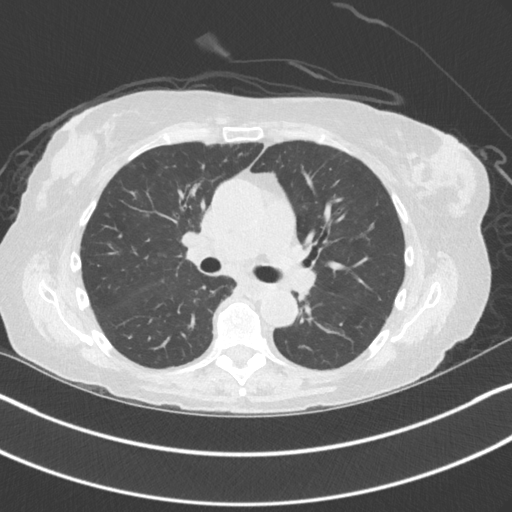
[im 99/157  lung]
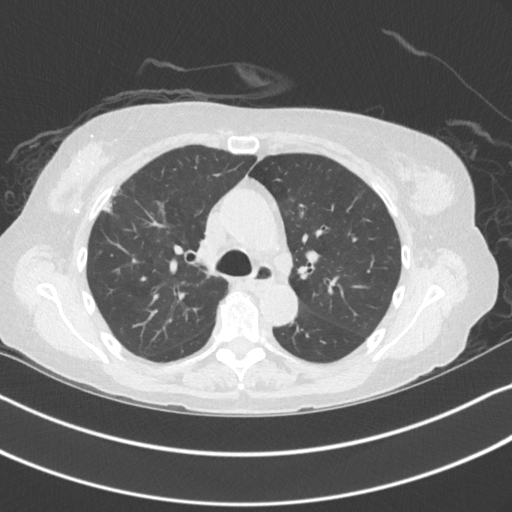
[im 110/157  mediastinal]
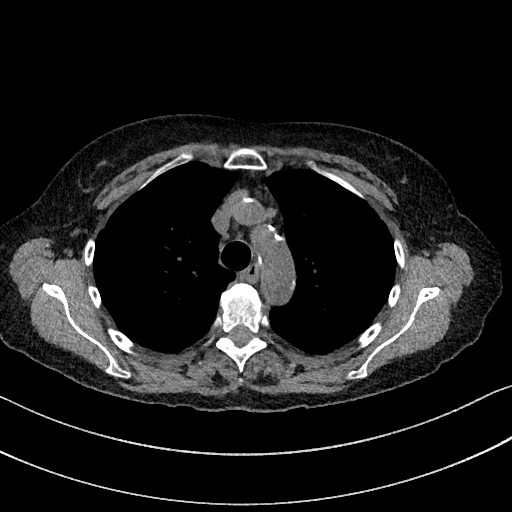
[im 110/157  lung]
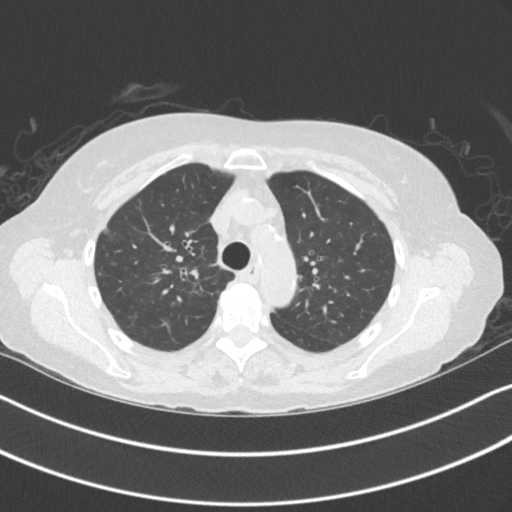
[im 122/157  lung]
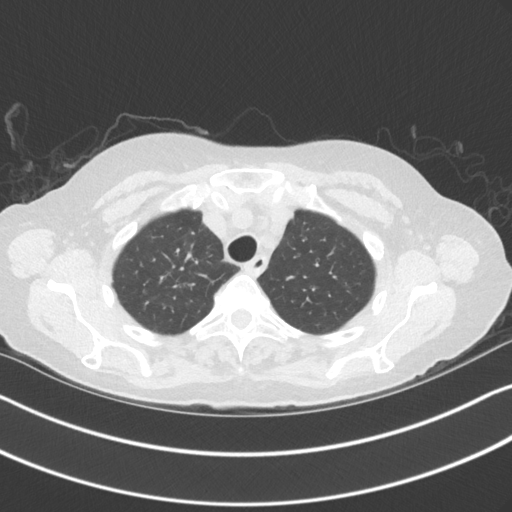
[im 133/157  lung]
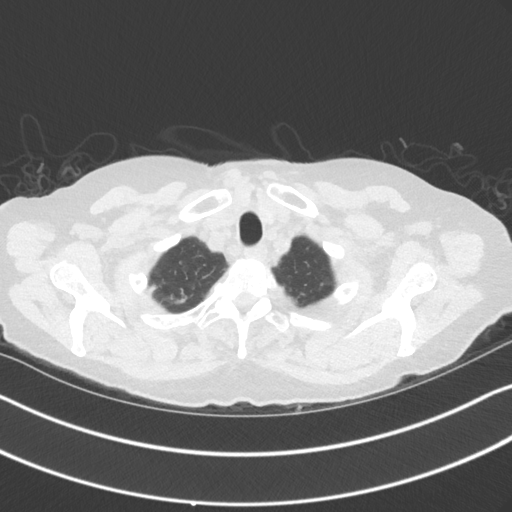
[im 145/157  lung]
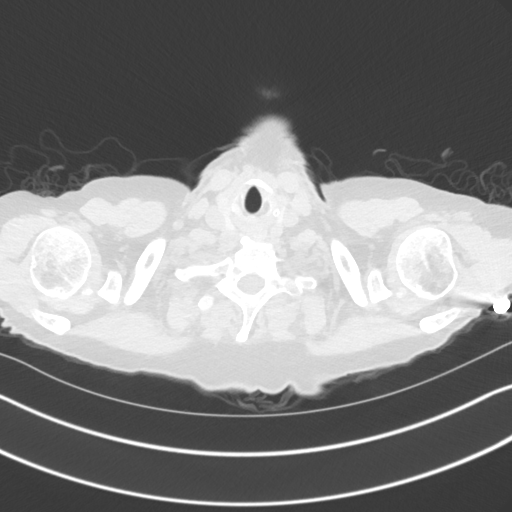

[Series 5: chest w/o 2mm st cor · coronal · non-contrast · 0.61mm/px · 3 of 148 slices shown]
[im 30/148  lung]
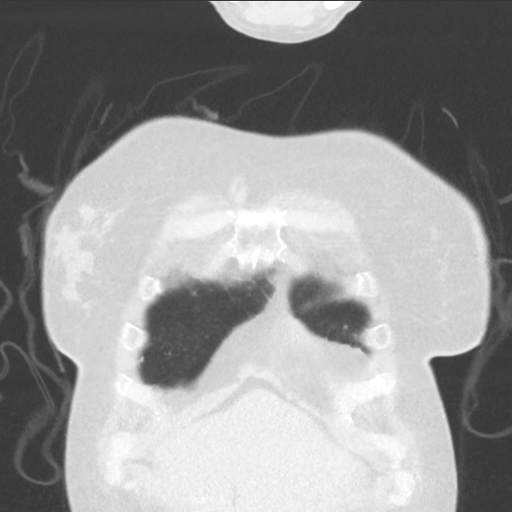
[im 59/148  lung]
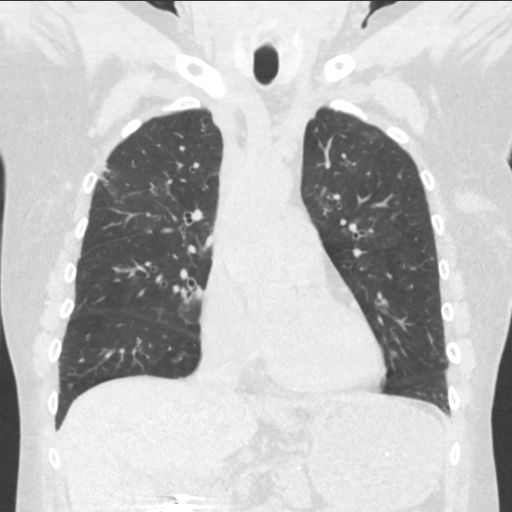
[im 89/148  lung]
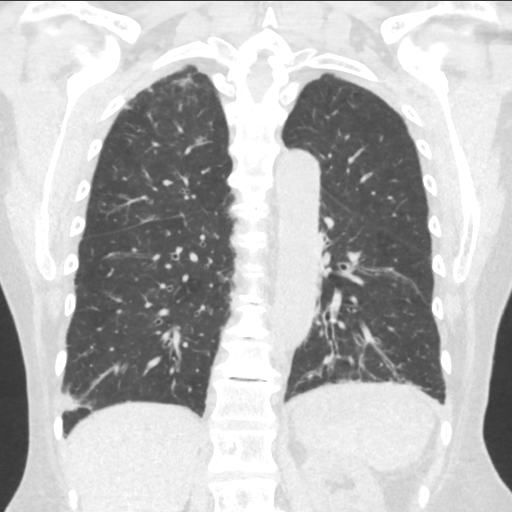

[15 of 36 positions shown; findings below may reference images not displayed]

FINDINGS: Cardiovascular: Normal heart size. No pericardial effusions. Mild
coronary artery calcification. Calcification of the aorta. No
aneurysm.

Mediastinum/Nodes: Esophagus is decompressed. Scattered lymph nodes
in the mediastinum are not pathologically enlarged. Thyroid gland is
unremarkable.

Lungs/Pleura: Mild scarring in the lung apices. Scattered patchy
areas of subpleural fibrosis or infiltration. Motion artifact in the
bases limits evaluation but there appears to be patchy airspace
disease with a nodular configuration in the bases, greatest on the
left. This may indicate early pneumonia. Airways are patent. No
pleural effusions. No pneumothorax.

Upper Abdomen: Surgical absence of the gallbladder. No acute changes
demonstrated in the visualized upper abdomen.

Musculoskeletal: Degenerative changes in the spine. No destructive
bone lesions.
IMPRESSION: 1. Patchy airspace disease with a nodular configuration in the
bases, greatest on the left. This may indicate early pneumonia.
2. Scattered patchy areas of subpleural fibrosis or infiltration.
3. Aortic atherosclerosis.

Aortic Atherosclerosis ([id number]).
